# Patient Record
Sex: Male | Born: 1935 | Race: White | Hispanic: No | Marital: Married | Smoking: Never smoker
Health system: Southern US, Community
[De-identification: ages and names within clinical notes are randomized; demographics above are authoritative.]

## PROBLEM LIST (undated history)

## (undated) DIAGNOSIS — E079 Disorder of thyroid, unspecified: Secondary | ICD-10-CM

## (undated) DIAGNOSIS — E7800 Pure hypercholesterolemia, unspecified: Secondary | ICD-10-CM

## (undated) DIAGNOSIS — R339 Retention of urine, unspecified: Secondary | ICD-10-CM

## (undated) HISTORY — PX: BACK SURGERY: SHX140

---

## 2008-06-05 ENCOUNTER — Emergency Department (HOSPITAL_COMMUNITY): Admission: EM | Admit: 2008-06-05 | Discharge: 2008-06-05 | Payer: Self-pay | Admitting: Emergency Medicine

## 2008-07-18 ENCOUNTER — Ambulatory Visit: Payer: Self-pay | Admitting: Family Medicine

## 2008-07-18 DIAGNOSIS — A0472 Enterocolitis due to Clostridium difficile, not specified as recurrent: Secondary | ICD-10-CM | POA: Insufficient documentation

## 2008-07-18 DIAGNOSIS — N138 Other obstructive and reflux uropathy: Secondary | ICD-10-CM | POA: Insufficient documentation

## 2008-07-18 DIAGNOSIS — K219 Gastro-esophageal reflux disease without esophagitis: Secondary | ICD-10-CM | POA: Insufficient documentation

## 2008-07-18 DIAGNOSIS — K279 Peptic ulcer, site unspecified, unspecified as acute or chronic, without hemorrhage or perforation: Secondary | ICD-10-CM | POA: Insufficient documentation

## 2008-07-18 DIAGNOSIS — R5383 Other fatigue: Secondary | ICD-10-CM

## 2008-07-18 DIAGNOSIS — R5381 Other malaise: Secondary | ICD-10-CM

## 2008-07-18 DIAGNOSIS — K5900 Constipation, unspecified: Secondary | ICD-10-CM | POA: Insufficient documentation

## 2008-07-18 DIAGNOSIS — N401 Enlarged prostate with lower urinary tract symptoms: Secondary | ICD-10-CM

## 2008-07-18 LAB — CONVERTED CEMR LAB
Nitrite: NEGATIVE
Protein, U semiquant: 100
Urobilinogen, UA: 0.2

## 2008-07-19 ENCOUNTER — Ambulatory Visit (HOSPITAL_COMMUNITY): Admission: RE | Admit: 2008-07-19 | Discharge: 2008-07-19 | Payer: Self-pay | Admitting: Family Medicine

## 2008-07-19 LAB — CONVERTED CEMR LAB
AST: 25 units/L (ref 0–37)
Albumin: 3.5 g/dL (ref 3.5–5.2)
BUN: 42 mg/dL — ABNORMAL HIGH (ref 6–23)
CO2: 22 meq/L (ref 19–32)
Calcium: 8.7 mg/dL (ref 8.4–10.5)
Chloride: 100 meq/L (ref 96–112)
Glucose, Bld: 107 mg/dL — ABNORMAL HIGH (ref 70–99)
PSA: 8.52 ng/mL — ABNORMAL HIGH (ref 0.10–4.00)
Potassium: 3.5 meq/L (ref 3.5–5.3)

## 2008-07-20 ENCOUNTER — Encounter (INDEPENDENT_AMBULATORY_CARE_PROVIDER_SITE_OTHER): Payer: Self-pay | Admitting: Family Medicine

## 2008-07-20 LAB — CONVERTED CEMR LAB
Amylase: 65 units/L (ref 27–131)
Eosinophils Absolute: 0 10*3/uL (ref 0.0–0.7)
Lipase: 20 units/L (ref 11–59)
Lymphs Abs: 1.2 10*3/uL (ref 0.7–4.0)
MCV: 93.3 fL (ref 78.0–100.0)
Neutrophils Relative %: 89 % — ABNORMAL HIGH (ref 43–77)
Platelets: 382 10*3/uL (ref 150–400)
WBC: 13.7 10*3/uL — ABNORMAL HIGH (ref 4.0–10.5)

## 2008-07-25 ENCOUNTER — Ambulatory Visit: Payer: Self-pay | Admitting: Family Medicine

## 2008-07-25 DIAGNOSIS — E039 Hypothyroidism, unspecified: Secondary | ICD-10-CM | POA: Insufficient documentation

## 2008-07-26 ENCOUNTER — Encounter (INDEPENDENT_AMBULATORY_CARE_PROVIDER_SITE_OTHER): Payer: Self-pay | Admitting: Family Medicine

## 2008-07-27 ENCOUNTER — Telehealth (INDEPENDENT_AMBULATORY_CARE_PROVIDER_SITE_OTHER): Payer: Self-pay | Admitting: Family Medicine

## 2008-08-25 ENCOUNTER — Ambulatory Visit: Payer: Self-pay | Admitting: Family Medicine

## 2008-08-26 ENCOUNTER — Encounter (INDEPENDENT_AMBULATORY_CARE_PROVIDER_SITE_OTHER): Payer: Self-pay | Admitting: Family Medicine

## 2008-09-11 ENCOUNTER — Encounter (INDEPENDENT_AMBULATORY_CARE_PROVIDER_SITE_OTHER): Payer: Self-pay | Admitting: Urology

## 2008-09-12 ENCOUNTER — Inpatient Hospital Stay (HOSPITAL_COMMUNITY): Admission: RE | Admit: 2008-09-12 | Discharge: 2008-09-15 | Payer: Self-pay | Admitting: Urology

## 2008-10-02 ENCOUNTER — Ambulatory Visit: Payer: Self-pay | Admitting: Family Medicine

## 2008-10-02 DIAGNOSIS — K2980 Duodenitis without bleeding: Secondary | ICD-10-CM | POA: Insufficient documentation

## 2008-11-27 ENCOUNTER — Ambulatory Visit: Payer: Self-pay | Admitting: Family Medicine

## 2008-11-27 DIAGNOSIS — M25569 Pain in unspecified knee: Secondary | ICD-10-CM

## 2008-12-01 ENCOUNTER — Telehealth (INDEPENDENT_AMBULATORY_CARE_PROVIDER_SITE_OTHER): Payer: Self-pay | Admitting: *Deleted

## 2008-12-04 ENCOUNTER — Ambulatory Visit: Payer: Self-pay | Admitting: Family Medicine

## 2008-12-04 ENCOUNTER — Ambulatory Visit (HOSPITAL_COMMUNITY): Admission: RE | Admit: 2008-12-04 | Discharge: 2008-12-04 | Payer: Self-pay | Admitting: Family Medicine

## 2008-12-04 DIAGNOSIS — L723 Sebaceous cyst: Secondary | ICD-10-CM

## 2008-12-05 ENCOUNTER — Telehealth (INDEPENDENT_AMBULATORY_CARE_PROVIDER_SITE_OTHER): Payer: Self-pay | Admitting: *Deleted

## 2008-12-05 ENCOUNTER — Encounter (INDEPENDENT_AMBULATORY_CARE_PROVIDER_SITE_OTHER): Payer: Self-pay | Admitting: Family Medicine

## 2008-12-08 ENCOUNTER — Ambulatory Visit (HOSPITAL_COMMUNITY): Admission: RE | Admit: 2008-12-08 | Discharge: 2008-12-08 | Payer: Self-pay | Admitting: Family Medicine

## 2008-12-08 ENCOUNTER — Encounter (INDEPENDENT_AMBULATORY_CARE_PROVIDER_SITE_OTHER): Payer: Self-pay | Admitting: Family Medicine

## 2008-12-11 ENCOUNTER — Telehealth (INDEPENDENT_AMBULATORY_CARE_PROVIDER_SITE_OTHER): Payer: Self-pay | Admitting: *Deleted

## 2008-12-11 ENCOUNTER — Ambulatory Visit: Payer: Self-pay | Admitting: Family Medicine

## 2008-12-11 DIAGNOSIS — M5137 Other intervertebral disc degeneration, lumbosacral region: Secondary | ICD-10-CM

## 2008-12-12 ENCOUNTER — Encounter: Admission: RE | Admit: 2008-12-12 | Discharge: 2008-12-12 | Payer: Self-pay | Admitting: Neurosurgery

## 2008-12-18 ENCOUNTER — Telehealth (INDEPENDENT_AMBULATORY_CARE_PROVIDER_SITE_OTHER): Payer: Self-pay | Admitting: *Deleted

## 2008-12-27 ENCOUNTER — Encounter: Admission: RE | Admit: 2008-12-27 | Discharge: 2008-12-27 | Payer: Self-pay | Admitting: Neurosurgery

## 2009-01-02 ENCOUNTER — Observation Stay (HOSPITAL_COMMUNITY): Admission: AD | Admit: 2009-01-02 | Discharge: 2009-01-03 | Payer: Self-pay | Admitting: Neurosurgery

## 2009-01-09 ENCOUNTER — Inpatient Hospital Stay (HOSPITAL_COMMUNITY): Admission: RE | Admit: 2009-01-09 | Discharge: 2009-01-11 | Payer: Self-pay | Admitting: Neurosurgery

## 2009-02-01 ENCOUNTER — Encounter (INDEPENDENT_AMBULATORY_CARE_PROVIDER_SITE_OTHER): Payer: Self-pay | Admitting: Family Medicine

## 2009-04-16 ENCOUNTER — Ambulatory Visit: Payer: Self-pay | Admitting: Family Medicine

## 2009-04-16 DIAGNOSIS — E538 Deficiency of other specified B group vitamins: Secondary | ICD-10-CM

## 2009-04-16 DIAGNOSIS — E8809 Other disorders of plasma-protein metabolism, not elsewhere classified: Secondary | ICD-10-CM

## 2009-04-17 ENCOUNTER — Encounter (INDEPENDENT_AMBULATORY_CARE_PROVIDER_SITE_OTHER): Payer: Self-pay | Admitting: Family Medicine

## 2009-04-17 LAB — CONVERTED CEMR LAB
ALT: 15 units/L (ref 0–53)
Albumin: 4 g/dL (ref 3.5–5.2)
Bilirubin, Direct: 0.1 mg/dL (ref 0.0–0.3)
TSH: 3.13 microintl units/mL (ref 0.350–4.500)
Total Protein: 6.5 g/dL (ref 6.0–8.3)
Vitamin B-12: 545 pg/mL (ref 211–911)

## 2009-05-03 ENCOUNTER — Encounter (INDEPENDENT_AMBULATORY_CARE_PROVIDER_SITE_OTHER): Payer: Self-pay | Admitting: Family Medicine

## 2009-07-04 ENCOUNTER — Encounter: Admission: RE | Admit: 2009-07-04 | Discharge: 2009-07-17 | Payer: Self-pay | Admitting: Neurology

## 2009-07-05 ENCOUNTER — Encounter (INDEPENDENT_AMBULATORY_CARE_PROVIDER_SITE_OTHER): Payer: Self-pay | Admitting: *Deleted

## 2009-10-11 ENCOUNTER — Encounter: Payer: Self-pay | Admitting: Gastroenterology

## 2009-10-16 HISTORY — PX: COLONOSCOPY: SHX174

## 2009-10-17 ENCOUNTER — Encounter: Payer: Self-pay | Admitting: Gastroenterology

## 2009-10-22 ENCOUNTER — Ambulatory Visit (HOSPITAL_COMMUNITY): Admission: RE | Admit: 2009-10-22 | Discharge: 2009-10-22 | Payer: Self-pay | Admitting: Gastroenterology

## 2009-10-22 ENCOUNTER — Telehealth (INDEPENDENT_AMBULATORY_CARE_PROVIDER_SITE_OTHER): Payer: Self-pay

## 2009-10-22 ENCOUNTER — Ambulatory Visit: Payer: Self-pay | Admitting: Gastroenterology

## 2010-02-08 ENCOUNTER — Encounter (INDEPENDENT_AMBULATORY_CARE_PROVIDER_SITE_OTHER): Payer: Self-pay | Admitting: *Deleted

## 2010-08-03 IMAGING — CT CT PELVIS W/ CM
2 of 4 series · 17 of 46 positions shown, 19 images · IV contrast (CONTRAST)
Comparison: CT pelvis dated 07/19/2008

CLINICAL DATA: Left hip pain with progressive left leg weakness
and left foot swelling.

CT PELVIS WITH CONTRAST
TECHNIQUE: Multidetector CT imaging of the pelvis was performed
using the standard protocol following the bolus administration of
intravenous contrast.
Contrast: 75 ml [id number]

[Series 2: pelvis · axial · 0.68mm/px · z∈[+322,+547]mm · 14 of 53 slices shown, 16 images]
[im 4/53  soft-tissue]
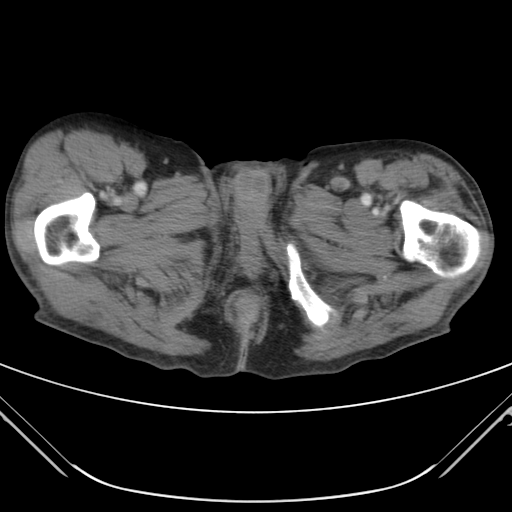
[im 4/53  bone]
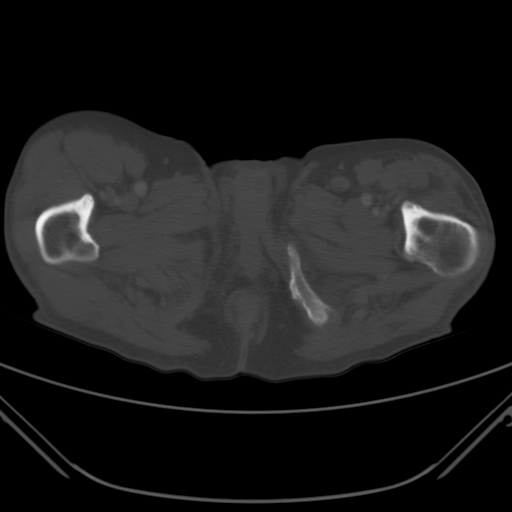
[im 7/53  soft-tissue]
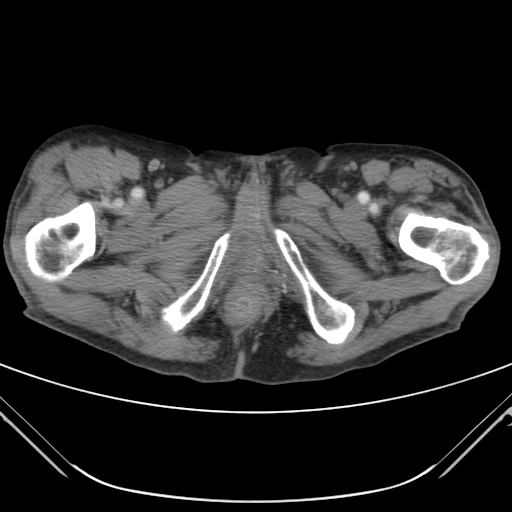
[im 10/53  soft-tissue]
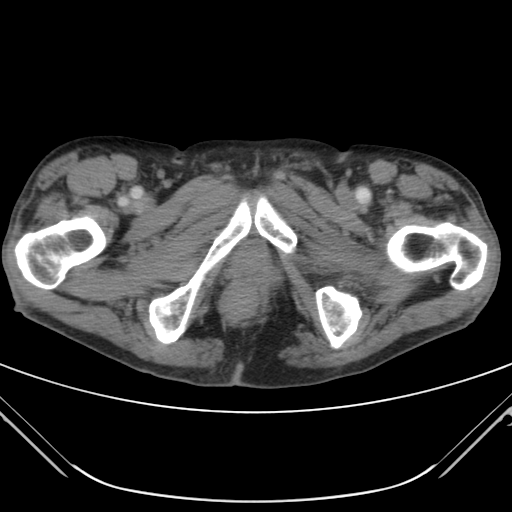
[im 16/53  soft-tissue]
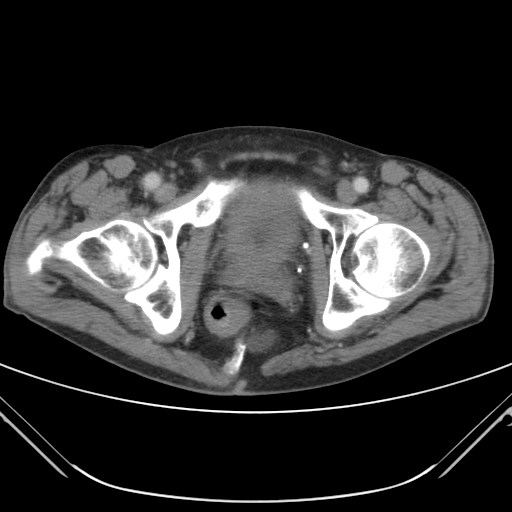
[im 19/53  soft-tissue]
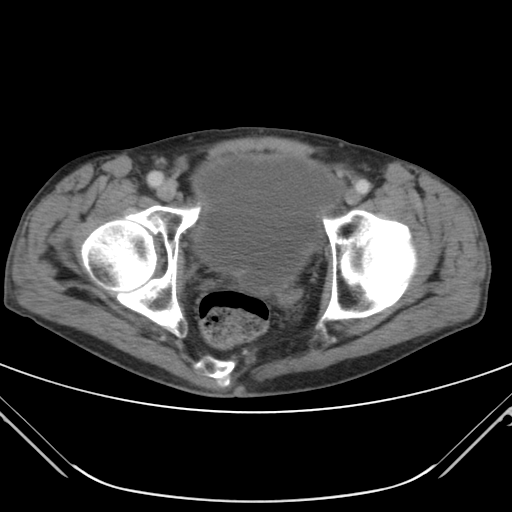
[im 22/53  soft-tissue]
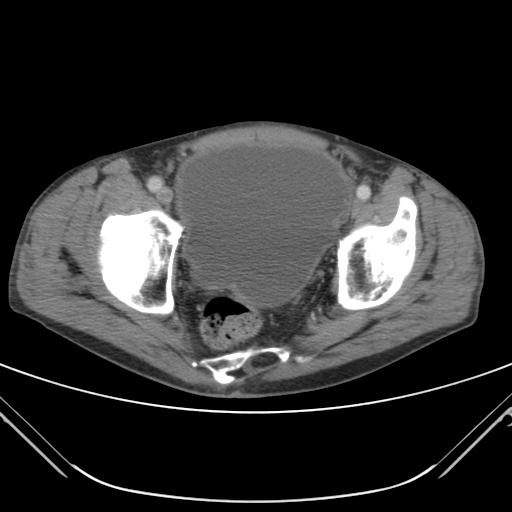
[im 25/53  soft-tissue]
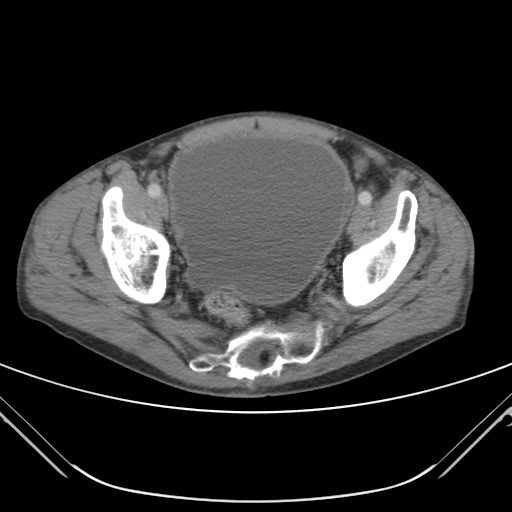
[im 28/53  soft-tissue]
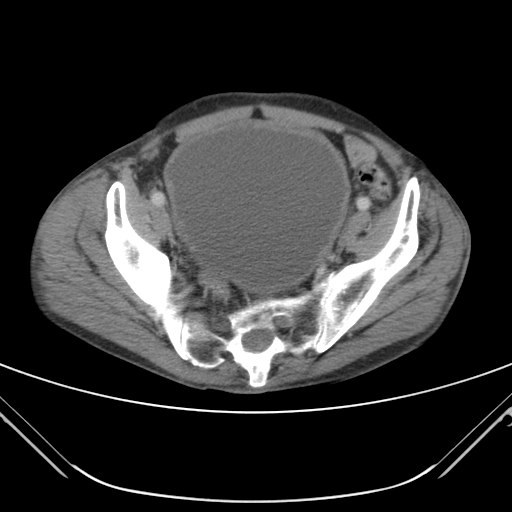
[im 31/53  soft-tissue]
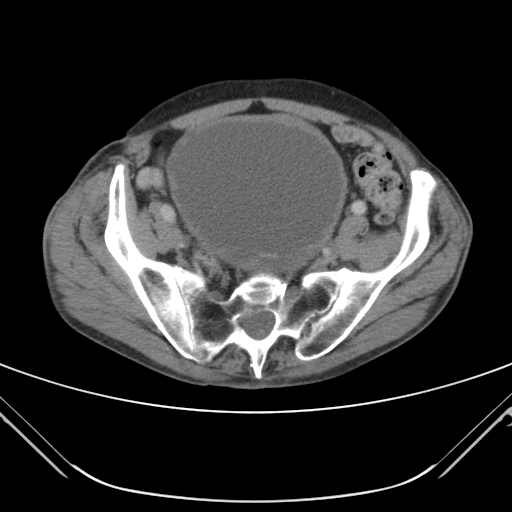
[im 31/53  bone]
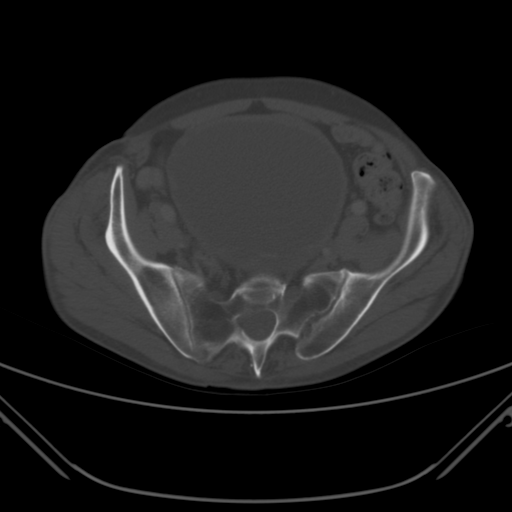
[im 34/53  soft-tissue]
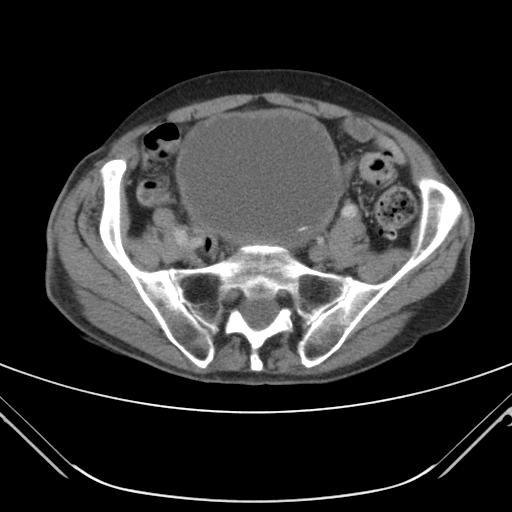
[im 40/53  soft-tissue]
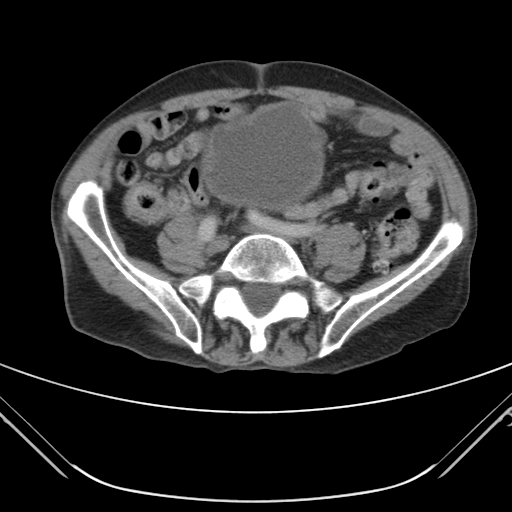
[im 43/53  soft-tissue]
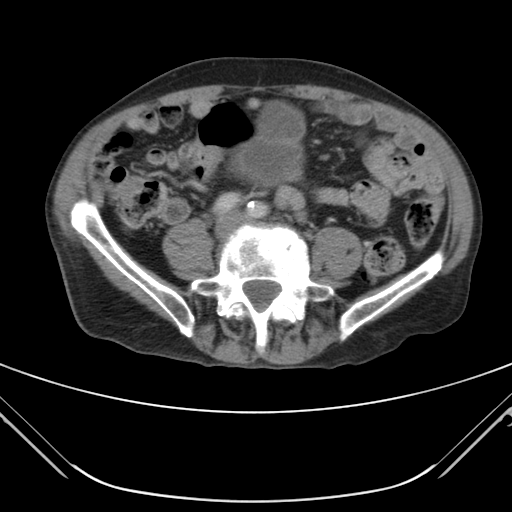
[im 46/53  soft-tissue]
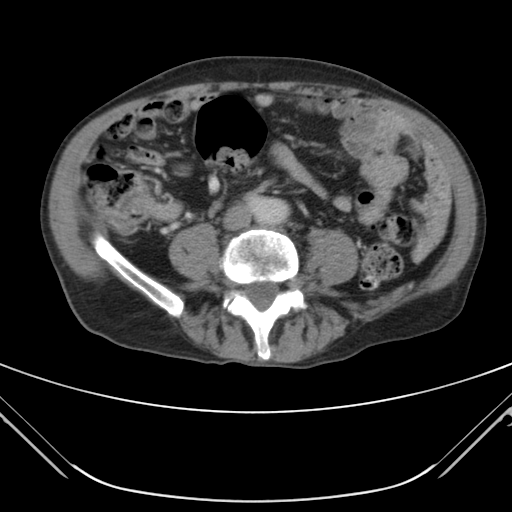
[im 49/53  soft-tissue]
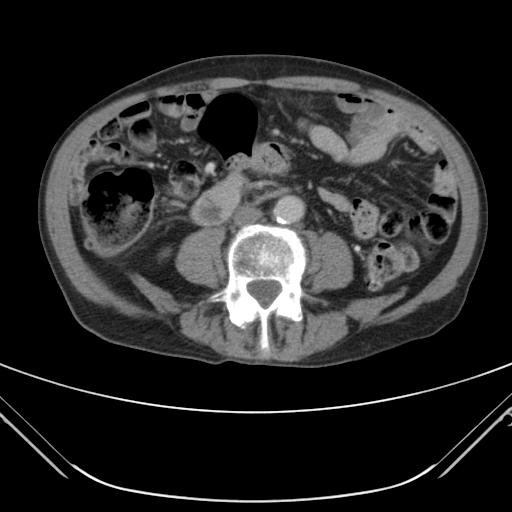

[coronals · coronal · 0.68mm/px · 3 of 107 slices shown]
[im 36/107  soft-tissue]
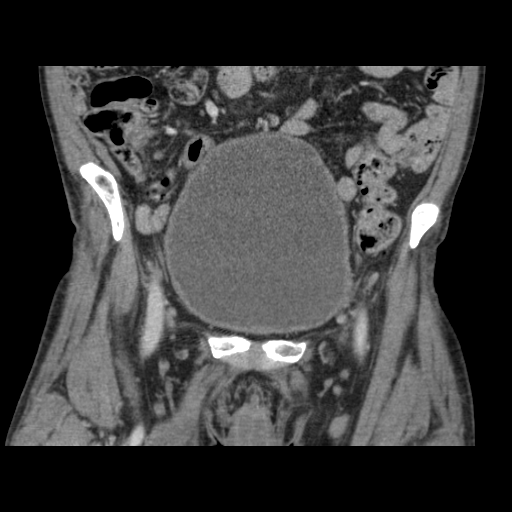
[im 48/107  soft-tissue]
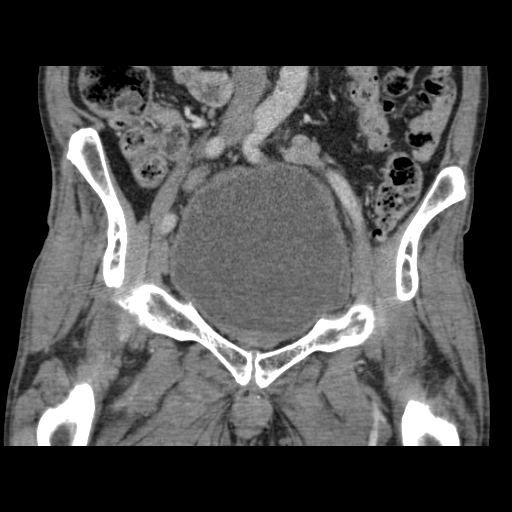
[im 59/107  soft-tissue]
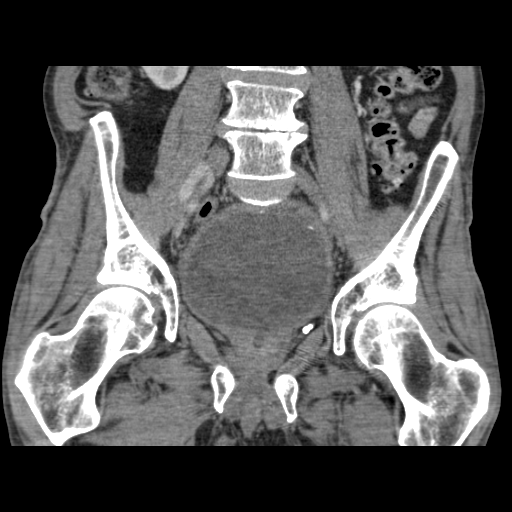

[17 of 46 positions shown; findings below may reference images not displayed]

FINDINGS: Again noted is the tethered cord with a
lipomyelomeningocele of the distal sacrum.  This is unchanged since
the prior CT scan dated 07/19/2008 and the prior lumbar MRI dated
12/08/2008.

The bladder is markedly distended which is probably neurogenic in
origin.  The distended urinary bladder is immediately anterior to
the left S1 nerve root as it exits the left S1 neural foramen but
the nerve is not compressed.  The remainder of the left sacral
plexus and sciatic nerve appear normal.

There is a 2.3 cm soft tissue density just to the left of the
rectum adjacent to the tip of the deformed distal sacrum which is
unchanged and probably represents a small meningocele.

The seminal vesicles were markedly distended on the prior exam but
these appear normal on the current exam.

Note is made of scattered diverticuli in the distal colon.  No
adenopathy.  There is mild degenerative disc and joint disease in
the lower lumbar spine.
IMPRESSION: 1.  No significant change in the appearance of the pelvis since the
prior CT scan dated 07/19/2008 other than the bladder is markedly
distended and the seminal vesicles are no longer distended.
2.  The distended urinary bladder it is immediately adjacent to but
not compressing the left S1 nerve root as it exits the left S1
foramen.
3.  There are several tiny calcifications in the mucosa of the
posterior wall of the bladder, nonspecific.  However,
calcifications can be associated with bladder malignancies.

## 2010-09-17 NOTE — Letter (Signed)
Summary: TRIAGE  TRIAGE   Imported By: Diana Eves 10/11/2009 13:45:00  _____________________________________________________________________  External Attachment:    Type:   Image     Comment:   External Document  Appended Document: TRIAGE TRILYTE  Appended Document: TRIAGE Rx and instructions faxed to [facility]. LMOM for pt to call.  Appended Document: TRIAGE Pt's wife returned call. Reviewed instructions with her. She will call if any questions after she picks up the Rx and paper work.

## 2010-09-17 NOTE — Letter (Signed)
Summary: Internal Other /triage/instructions  Internal Other /triage/instructions   Imported By: Cloria Spring LPN 16/05/9603 54:09:81  _____________________________________________________________________  External Attachment:    Type:   Image     Comment:   External Document

## 2010-09-17 NOTE — Progress Notes (Signed)
Summary: samples given  Phone Note Other Incoming   Summary of Call: left 4 boxed of [employer]- Lactose Intolerance at the front desk per [facility]. Initial call taken by: Hendricks Limes LPN,  October 22, 2009 10:45 AM

## 2010-09-17 NOTE — Letter (Signed)
Summary: Recall Office Visit  [facility]  [address]   [place], [place] [address]   Phone: [phone]  Fax: [phone]      February 08, 2010   Benjamin [name] [address] [place], [place]  [address] [birth year]   Dear Mr. Schoenberg,   According to our records, it is time for you to schedule a follow-up office visit with [place].   At your convenience, please call [phone] to schedule an office visit. If you have any questions, concerns, or feel that this letter is in error, we would appreciate your call.   Sincerely,    Diana Eves  [facility] Ph: [phone]   Fax: [phone]

## 2010-11-26 LAB — COMPREHENSIVE METABOLIC PANEL
ALT: 26 U/L (ref 0–53)
AST: 19 U/L (ref 0–37)
Albumin: 3.3 g/dL — ABNORMAL LOW (ref 3.5–5.2)
Alkaline Phosphatase: 52 U/L (ref 39–117)
GFR calc Af Amer: >60 mL/min (ref >60)
Potassium: 3.8 mEq/L (ref 3.5–5.1)
Sodium: 141 mEq/L (ref 135–145)
Total Protein: 6.1 g/dL (ref 6.0–8.3)

## 2010-11-26 LAB — URINALYSIS, MICROSCOPIC ONLY
Glucose, UA: NEGATIVE mg/dL
Hgb urine dipstick: NEGATIVE
Leukocytes, UA: NEGATIVE
Specific Gravity, Urine: 1.014 (ref 1.005–1.030)
Urobilinogen, UA: 0.2 mg/dL (ref 0.0–1.0)

## 2010-11-26 LAB — CBC
Hemoglobin: 13.1 g/dL (ref 13.0–17.0)
Platelets: 199 10*3/uL (ref 150–400)
RDW: 13.9 % (ref 11.5–15.5)

## 2010-11-26 LAB — GLUCOSE, RANDOM: Glucose, Bld: 106 mg/dL — ABNORMAL HIGH (ref 70–99)

## 2010-12-02 LAB — BASIC METABOLIC PANEL
BUN: 16 mg/dL (ref 6–23)
BUN: 8 mg/dL (ref 6–23)
Chloride: 107 mEq/L (ref 96–112)
Chloride: 107 mEq/L (ref 96–112)
Creatinine, Ser: 0.61 mg/dL (ref 0.4–1.5)
GFR calc non Af Amer: >60 mL/min (ref >60)
Glucose, Bld: 115 mg/dL — ABNORMAL HIGH (ref 70–99)
Glucose, Bld: 115 mg/dL — ABNORMAL HIGH (ref 70–99)
Potassium: 3.8 mEq/L (ref 3.5–5.1)
Potassium: 4 mEq/L (ref 3.5–5.1)
Sodium: 139 mEq/L (ref 135–145)

## 2010-12-02 LAB — CBC
HCT: 38.7 % — ABNORMAL LOW (ref 39.0–52.0)
HCT: 40.5 % (ref 39.0–52.0)
Hemoglobin: 13.1 g/dL (ref 13.0–17.0)
MCV: 89.6 fL (ref 78.0–100.0)
MCV: 90.2 fL (ref 78.0–100.0)
Platelets: 242 10*3/uL (ref 150–400)
Platelets: 314 10*3/uL (ref 150–400)
RDW: 14.2 % (ref 11.5–15.5)
RDW: 14.3 % (ref 11.5–15.5)
WBC: 6.5 10*3/uL (ref 4.0–10.5)

## 2010-12-02 LAB — DIFFERENTIAL
Basophils Absolute: 0 10*3/uL (ref 0.0–0.1)
Basophils Relative: 0 % (ref 0–1)
Eosinophils Absolute: 0.3 10*3/uL (ref 0.0–0.7)
Eosinophils Relative: 4 % (ref 0–5)
Lymphs Abs: 1.5 10*3/uL (ref 0.7–4.0)
Neutrophils Relative %: 62 % (ref 43–77)

## 2010-12-02 LAB — CLOSTRIDIUM DIFFICILE EIA: C difficile Toxins A+B, EIA: 29

## 2010-12-02 LAB — URINE CULTURE: Special Requests: POSITIVE

## 2010-12-31 NOTE — Op Note (Signed)
NAME:  Benjamin [name], Benjamin [name]              ACCOUNT NO.:  [id number]   MEDICAL RECORD NO.:  [id number]           PATIENT TYPE:   LOCATION:                                 FACILITY:   PHYSICIAN:  Hilda Lias, M.D.   DATE OF BIRTH:  [birth year]   DATE OF PROCEDURE:  01/05/2009  DATE OF DISCHARGE:                               OPERATIVE REPORT   PREOPERATIVE DIAGNOSES:  Left L4-L5 extraforaminal herniated disk with  L4 radiculopathy, atrophy of the left leg, neurogenic bladder, and  lipomeningocele.   POSTOPERATIVE DIAGNOSES:  Left L4-L5 extraforaminal herniated disk with  L4 radiculopathy, atrophy of the left leg, neurogenic bladder, and  lipomeningocele.   PROCEDURE:  Left L4-5 extraforaminal diskectomy with decompression of  the L4 nerve root.  Microscope.   SURGEON:  Hilda Lias, MD   ASSISTANT:  Dr. Lovell Sheehan.   CLINICAL HISTORY:  [name] is a gentleman, who was seen by me in my  office with history of neurogenic bladder, lipomeningocele, and pain  going to the left foot.  In my physical examination, I found that he has  atrophy of the left while the MRI showed that he has a lipomeningocele.  We did an MRI of the lumbar plexus, which was also negative.  EMG and  nerve conduction tests showed that he might have lumbar plexopathy.  The  patient had been seen by Dr. Thad Ranger.  With pain in the left leg down  to the left foot, Dr. Thad Ranger advised to proceed with the surgery to  decompress the L4 nerve root.  The patient and the wife knew that this  would not affect whatsoever his neurogenic bladder or the atrophy of the  left leg, which is old.  He is fully aware that this would help with the  shooting pain down to the left foot.   DESCRIPTION OF PROCEDURE:  The patient was taken to the OR and after  intubation, he was positioned in a prone manner.  Midline incision from  L4-L5 was made.  Retraction was done laterally.  Two x-rays showed that  indeed we were at the level of  L4-5, extraforaminal.  We brought the  microscope into the area.  With a drill, we did remove part of the facet  superolaterally.  The intertransverse ligament was also excised.  Indeed, we found that the L4 nerve root was displaced.  Although  distally it was normal, proximally there was a large herniated disk  affecting the takeoff of L4.  Incision was made and several pieces of  fragment of disk were removed.  At the end, we have good decompression  of the L4 nerve root.  Valsalva maneuver was negative.  Fentanyl and  Depo-Medrol were left in the epidural space, and the wound was closed  with Vicryl and Steri-Strips.           ______________________________  Hilda Lias, M.D.     EB/MEDQ  D:  01/09/2009  T:  01/10/2009  Job:  [id number]

## 2010-12-31 NOTE — Consult Note (Signed)
Benjamin [name], Benjamin [name]              ACCOUNT NO.:  [id number]   MEDICAL RECORD NO.:  [id number]          PATIENT TYPE:  INP   LOCATION:  3030                         FACILITY:  [facility]   PHYSICIAN:  Casimiro Needle L. Reynolds, M.D.DATE OF BIRTH:  1933-02-26   DATE OF CONSULTATION:  DATE OF DISCHARGE:                                 CONSULTATION   CHIEF COMPLAINT:  Left leg weakness.   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  Apparently approximately 2 months ago, he was working  on his car in a [place] style like sitting with his left leg crossed over  his right.  He was changing a tire when he noticed a small twinge his  back.  The patient got nothing of it at that time, finished his job, and  went to bed that night.  When he woke up in the morning, he noted slight  discomfort in the posterior aspect of his hamstring on his left leg and  some weakness.  According to his wife, this weakness increased over the  past 1-2 weeks which became a daily nuisance.  The patient went to see  his renal physician, Dr. Roel Cluck approximately 2-3 weeks later in which  he noticed that he had some weakness in his left leg.  At that time, Dr.  Roel Cluck placed him on some Flexeril, ordered an x-ray and MRI of his  lower back.  Unfortunately, the Flexeril caused him to be very dizzy,  however, the MRI of his lower back on December 08, 2008 did show mild disk  bulge with focal far left protrusion and exited left L4 root at the L4-  L5 junction.  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  At that time, the  patient was ordered for a myelogram with CT myelogram of his lower back  which at that time showed advanced degenerative disk disease at L4-L5.  The reading was without  focal compressive lesion.  It also showed the  lipomeningocele with spinal cord tethering abnormality low at the level  of S4.  The lipoma measured roughly 14 x 15 x 20 mm.  CT myelogram of  the thoracic spine showed no abnormalities.  The patient at that time  was referred to neurosurgeon, Dr. Jeral Fruit who has recently admitted the  patient secondary to continued atrophy, weakness, and decreased ability  to extend his left leg.  Dr. Jeral Fruit ordered a CT of pelvis which showed  no significant change in appearance of the pelvis since the prior CT on  July 19, 2008.  This showed distention of the urinary bladder, but no  compressing of the S1 nerve root as it exits the left S1 foramen.   At the present time of consultation, the patient is lying supine in bed  without any discomfort.  He is alert.  He is oriented.  He is describing  allodynia and hypoesthesia of his left medial aspect of his calf along  with some hypoesthesia of the left lateral aspect of his knee.  Describes the inability to extend his leg but no positive dorsiflexion  or plantar flexion.  He denies any bowel or bladder problems.  He denies  any weakness with hip flexion.  It should be noted that he also admits  to having some significant weakness with his left leg adduction.   PAST SURGICAL HISTORY:  Appendectomy in 1940 and TURP in January 2010.   PAST MEDICAL HISTORY:  Negative with the exception of above.   MEDICATIONS:  The patient only takes ibuprofen at home.  He has tried  Flexeril for pain, however, he had significant side effects.  He is now  being placed on OxyContin by Dr. Jeral Fruit for pain.  We will recommend  placing him on Neurontin 300 mg t.i.d.   ALLERGIES:  No known drug allergies.   SOCIAL HISTORY:  Does not smoke, drink, or do illicit drugs.   REVIEW OF SYSTEMS:  All negative except for above.   PHYSICAL EXAMINATION:  VITAL SIGNS:  Temperature is 98, pulse 84,  respirations 20, and blood pressure  is 140/80.  GENERAL:  The patient is alert.  He is oriented and has good demeanor.  He answers questions appropriately.  He is dressed well.  PULMONARY:  Clear to auscultation bilaterally.  CARDIOVASCULAR:  S1-S2.  Regular rate and rhythm.  NECK:  Negative for bruits and supple.  ABDOMEN:  Soft, nontender, and nondistended.  Bowel sounds are in all  four quadrants.  NEUROLOGIC:  Eyes:  Pupils are equal, round, and react to accommodation  and light.  Conjugate gaze.  Extraocular muscles are intact.  Visual  fields are grossly intact.  Face is symmetrical.  Tongue is midline.  Uvula is midline.  The patient does not have any dysarthria, aphagia, or  slurred speech.  Sensation is full from V1-V3 bilaterally to pinprick,  light touch.  Shoulder shrug and head turn is full with good range of  motion.  Coordination of finger-to-nose bilaterally is smooth.  Heel-to-  shin, he is only able to participate with his right over his left,  however, there is no dysmetria or ataxia.  Fine motor movements in the  upper extremities are within normal limits.  Gait:  The patient uses a  cane and has significant difficulty with the extension of his left knee.  He is able to flex and was unable to bear weight on his left leg  secondary to clot weakness.  Motor:  At this time, all strengths are  within normal limits about 5/5 with the exception of his left knee  extension which is 1/5, left knee flexion which is 4/5, left leg  adduction which is 2/5, left dorsiflexion is 5/5, left plantar flexion  is 4/5, left abduction is 5/5, left hip flexion is 4/5, and left leg  extension at the hip is 5/5.  There is notable left quad atrophy.  Sensation:  The patient has allodynia and hyperesthesia over his left  medial calf and has noted some left hypoesthesia over the lateral  portion of his knee.  The area that encompasses the allodynia and  hyperesthesia is from his left medial knee to his ankle.   LABORATORY DATA:   All pending at this time.   IMAGING TESTS:  As above.   ASSESSMENT:  At this time, this is a 77 year old male with left leg  weakness which  appears to be due to a high lumbar plexus lesion or  lesion likely multilevel with mid lumbar root involvement.  Predominately, most of the signs and symptoms are noted over the L4  nerve root region suggesting superimposed L4 radiculopathy which is  supported by imaging but does not explain his hip flexor weakness.  Another consideration is whether some of his problems are chronic due to  the underlying spinal dysmorphism which is decompensated in the face of  an acute process.  At this time, we would recommend gabapentin 300 mg  t.i.d. p.o. for neuropathic pain, physical therapy for evaluation and  treat of gait, pain control, MRI of his lumbar plexus, and basic labs  such as TSH, B12, and HbA1c.  We will continue to follow this patient.    ______________________________  Felicie Morn, PA-C      Marolyn Hammock. Thad Ranger, M.D.  Electronically Signed   DS/MEDQ  D:  01/02/2009  T:  01/03/2009  Job:  [id number]   cc:   Casimiro Needle L. Thad Ranger, M.D.

## 2010-12-31 NOTE — H&P (Signed)
NAME:  ZYAN, COBY NO.:  [id number]   MEDICAL RECORD NO.:  [id number]          PATIENT TYPE:  INP   LOCATION:  3001                         FACILITY:  [facility]   PHYSICIAN:  Hilda Lias, M.D.   DATE OF BIRTH:  04/25/1933   DATE OF ADMISSION:  01/09/2009  DATE OF DISCHARGE:                              HISTORY & PHYSICAL   Benjamin [name] is a gentleman who was seen in my office complaining of  weakness and burning sensation in the left leg.  The patient has a  complete workup, which showed atrophy of the left quadriceps with upper  lumbar plexus compromise.  The lumbar MRI was repeated twice and showed  that he has a herniated disk at L4-5 bilaterally compromising the L4  nerve root.  He had been seen by Dr. Thad Ranger last week when he was  admitted and he was told that he might have two problems, one the pain  going to the left foot, and the other one the lumbar plexus  inflammation.  The patient is being admitted today for L4-5 diskectomy  knowing that this will help with the pain going to the left foot, but  would not change his atrophy of the left side.   PAST MEDICAL HISTORY:  Bladder surgery.   ALLERGIES:  He is not allergic to medication.   SOCIAL HISTORY:  Negative.   FAMILY HISTORY:  Negative.   REVIEW OF SYSTEMS:  Positive for pain in the left leg and weakness.   PHYSICAL EXAMINATION:  GENERAL:  The patient came to my office.  He was  limping from the left leg.  HEENT:  Head, ears, nose, and throat normal.  NECK:  Normal.  LUNGS:  Clear.  CARDIAC:  Heart sounds Normal.  ABDOMEN:  Normal.  EXTREMITIES:  Normal pulse.  NEUROLOGIC:  He has atrophy of the left quadriceps and the left calf  muscle.  He has weakness of the iliopsoas as well as the quadriceps.  The MRI showed lateral meningocele and protrusion L4-5 compromising the  L4 nerve root.   CLINICAL IMPRESSION:  Lumbar plexus inflammation - disease, L4-5  bilateral herniated disk  compromising the L4 nerve root.   RECOMMENDATIONS:  The patient is being admitted for surgery.  The  surgery would be L4-5 diskectomy.  The patient knows the risk of the  surgery including the fact that it would help with the pain of the left  foot, but would not change the atrophy of the left leg.  He is going to  be seen later on by Dr. Thad Ranger from the Neurology Service.           ______________________________  Hilda Lias, M.D.     EB/MEDQ  D:  01/09/2009  T:  01/10/2009  Job:  [id number]

## 2010-12-31 NOTE — Op Note (Signed)
NAME:  Benjamin [name], Benjamin [name]              ACCOUNT NO.:  [id number]   MEDICAL RECORD NO.:  [id number]          PATIENT TYPE:  OIB   LOCATION:  A317                          FACILITY:  [facility]   PHYSICIAN:  Ky Barban, M.D.DATE OF BIRTH:  11-11-1932   DATE OF PROCEDURE:  DATE OF DISCHARGE:                               OPERATIVE REPORT   PREOPERATIVE DIAGNOSES:  1. Urinary retention.  2. Benign prostatic hypertrophy.   POSTOPERATIVE DIAGNOSES:  1. Urinary retention.  2. Benign prostatic hypertrophy.   PROCEDURE:  TUR prostate.   ANESTHESIA:  Spinal.   DESCRIPTION OF PROCEDURE:  The patient under spinal anesthesia in  lithotomy position on usual prep and drape, a #28 Iglesias resectoscope  was introduced into the bladder.  Bladder was inspected.  Resectoscope  was pulled back in mid prostatic urethra.  Bladder neck was  circumferentially dissected down to the circular fibers.  The bleeders  were coagulated.  Resectoscope was pulled back at the level of the  verumontanum, rotated to 11 o'clock position.  Right lobe was resected  between 11 and 7 o'clock position.  Similarly, the left lobe was  resected between 1 and 5 o'clock position.  Next, I resected the  posterior midline tissue carefully not to injure the sphincter of the  verumontanum.  There was small amount of tissue in the anterior midline,  which was resected at the end.  Prostatic urethra now looks wide open.  Chips were evacuated.  Bleeders were coagulated.  Resectoscope was  removed and a 22 three-way Foley catheter left in for drainage.  CBI was  started, which was clear.  The patient left the operating room in  satisfactory condition.      Ky Barban, M.D.  Electronically Signed     MIJ/MEDQ  D:  09/11/2008  T:  09/12/2008  Job:  [id number]

## 2010-12-31 NOTE — Consult Note (Signed)
NAME:  Benjamin [name], Benjamin [name] NO.:  [id number]   MEDICAL RECORD NO.:  [id number]          PATIENT TYPE:  AMB   LOCATION:  DAY                           FACILITY:  [facility]   PHYSICIAN:  Ky Barban, M.D.DATE OF BIRTH:  [birth year]   DATE OF CONSULTATION:  DATE OF DISCHARGE:                                 CONSULTATION   HISTORY OF PRESENT ILLNESS:  [name] is coming in to have a surgery  this coming Monday, and he will have a TUR prostate and he will be  admitted in the hospital and [facility].   CHIEF COMPLAINT:  Recurrent urinary tract infection, symptoms of  prostatism.   HISTORY:  A 77 year old gentleman who is in acute urinary retention.  Has slow and weak stream, nocturia x3 and when he was referred over here  to me by Dr. Erby Pian, and at that time they have checked him in his  office.  He had about 1800 mL residual urine.  The patient has given a  voiding trial.  He continues to have large residual urine and CT scan of  the abdominal and pelvis was done.  It shows he has normal upper tracts,  bladder is markedly trabeculated and deformed, prostate is enlarged and  the seminal vesicles were also deformed and dilated and there was  suspicion he might have prostate cancer.  PSA was slightly increased to  8.52, and so I did a prostate biopsy.  It is negative for any prostate  cancer.  Cystoscopy shows it is markedly trabeculated deformed bladder,  prostate is moderately enlarged with bladder neck obstruction.  He might  have neurogenic bladder, but it is difficult to say from his  cystometric.  I advised him that he needs to undergo TUR prostate, and  the patient says that he is voiding fine.  He has no voiding problems,  but his radiological finding and cystoscopic finding show that he does  have a large residual urine and trabeculated bladder.  So, I told him he  needs to have a TUR prostate, and if he can void satisfactorily with  emptying his bladder,  then he will be fine.  Otherwise, he may have to  live with intermittent catheterization.  I have discussed several times  with the patient and his wife the situation and they understand and want  me to go ahead and proceed.   PAST MEDICAL HISTORY:  Otherwise, essentially negative.   PERSONAL HISTORY:  Does not smoke or drink.   REVIEW OF SYSTEMS:  Unremarkable.   PHYSICAL EXAMINATION:  VITAL SIGNS:  Blood pressure 148/87, temperature  is normal.  CENTRAL NERVOUS SYSTEM:  Negative.  HEENT:  Negative.  CHEST:  Symmetrical.  HEART:  Regular sinus rhythm, no murmur.  ABDOMEN:  Soft, flat.  Liver, spleen, kidneys not palpable.  No CVA  tenderness.  EXTERNAL GENITALIA:  Uncircumcised.  Meatus adequate.  Testicles are  normal.  RECTAL:  Sphincter tone is normal.  No rectal mass.  Prostate 1-1/2  plus, smooth and firm.   IMPRESSION:  BPH with bladder neck obstruction.   PLAN:  TUR prostate then admit him to the hospital.      Ky Barban, M.D.  Electronically Signed     MIJ/MEDQ  D:  09/07/2008  T:  09/07/2008  Job:  [id number]   cc:   Franchot Heidelberg, M.D.

## 2010-12-31 NOTE — Consult Note (Signed)
Benjamin [name], Benjamin [name]              ACCOUNT NO.:  [id number]   MEDICAL RECORD NO.:  [id number]          PATIENT TYPE:  INP   LOCATION:  A317                          FACILITY:  [facility]   PHYSICIAN:  Osvaldo Shipper, MD     DATE OF BIRTH:  1932/10/08   DATE OF CONSULTATION:  09/15/2008  DATE OF DISCHARGE:  09/15/2008                                 CONSULTATION   REFERRING PHYSICIAN:  Dr. Jerre Simon.   PRIMARY MEDICAL DOCTOR:  Dr.  Erby Pian.   REASON FOR CONSULTATION:  C-diff colitis.   CHIEF COMPLAINT:  Diarrhea.   HISTORY OF PRESENT ILLNESS:  The patient is a 77 year old Caucasian male  who underwent transurethral resection of prostate on September 11, 2008.  He was admitted by Dr. Jerre Simon, and he was on his service.  About 2 days  ago, the patient started having diarrhea along with fever.  His last  temperature was 101.3 the day before yesterday.  Stool studies were sent  off and they came back showing C-diff today.  The patient was thought to  be ready for discharge, however, because C-diff was positive, Dr. Jerre Simon  wanted a consultation with [place] to clarify this issue.   The patient is feeling very well.  He had one episode of diarrhea at  6:00 a.m. this morning and none since then.  He had two episodes of  diarrhea yesterday and between those two yesterday and one this morning  there was a gap of 18 hours.  He denies any nausea or vomiting, denies  any more fever or chills, denies any abdominal pain.  He denies being  around anybody who has known C-diff.  He has been taking antibiotics as  an outpatient for various issues in the last few months.   MEDICATIONS AT HOME:  Otherwise, he does not take any other medications.   ALLERGIES:  HE IS ON NOT REALLY ALLERGIC TO ANYTHING.   PAST MEDICAL HISTORY:  Fairly unremarkable.  This gentleman is fairly  healthy.  The only other surgeries he has had is an appendectomy in the  1940s.   SOCIAL HISTORY:  He lives with his elderly wife and he is  fairly  independent and does not have any other vices.   FAMILY HISTORY:  Noncontributory.   REVIEW OF SYSTEMS:  Unremarkable, except as mentioned in HPI.   PHYSICAL EXAMINATION:  VITAL SIGNS:  Temperature 97.7, heart rate 84,  respiratory rate 20, blood pressure 117/63.  Saturation 97% on room air.  He has been afebrile over the past 24 hours.  GENERAL:  An elderly thin white male in no distress, very pleasant to  talk to.  HEENT:  There is no pallor, no icterus.  Oral mucous membranes are  moist.  No oral lesions are noted.  NECK:  Soft and supple.  No  thyromegaly is appreciated.  LUNGS:  Clear to auscultation bilaterally.  CARDIOVASCULAR:  S1-S2 is normal, regular.  No murmurs appreciated.  No  S3-S4.  No rubs, no bruits.  ABDOMEN:  Soft, completely benign, nontender, nondistended.  Bowel  sounds are present.  No masses or  organomegaly are appreciated.  EXTREMITIES:  Show no edema.  MUSCULOSKELETAL:  Unremarkable.  NEUROLOGICAL:  No focal deficits noted.   LABORATORY DATA:  No labs are available today.  The only thing that is  positive from yesterday is a C-diff x1.  No CBC is available from today.   No recent imaging studies have been done.   ASSESSMENT AND PLAN:  This is a 76 year old Caucasian male who is status  post transurethral resection of prostate procedure a few days ago and  was being discharged by Dr. Jerre Simon today when he noticed that a  Clostridium difficile was positive.  The patient did have some diarrhea  and was febrile over the last couple of days, but the diarrhea as  subsided and so has the fever.  The patient is very keen on going home.  He is not dizzy or lightheaded.  Vital signs are all stable, and this is  his first episode of Clostridium difficile.  He is not aware of anybody  that he has been exposed to.   So this patient possibly has mild Clostridium difficile colitis.  Since  he is otherwise stable, this can be treated as an outpatient.   Since  this is first episode, he will be treated with Flagyl.  He will be given  one dose of 500 mg now before he leaves the hospital and we have already  made arrangements for him to pick up his prescription and [facility].  The  prescription for Flagyl has been faxed over to them.  He has been told  very clearly that if his diarrhea recurs, if he starts getting fever and  chills, experiences abdominal pain, nausea, vomiting, any of these  symptoms or if he feels unwell in anyway, he needs to seek attention  immediately.   I have also told him that he needs to see his primary medical doctor,  Dr. Erby Pian next week.  I tried to call Dr. Claire Shown office this  evening; however, I was unable to get through to any [place].  I will  again reattempt calling him early next week.  The patient tells me that  he has an appointment with Dr. Erby Pian on September 22, 2008, which is  quite appropriate at this time.   So from a medical standpoint, he is stable for discharge.  This will be  communicated to Dr. Jerre Simon, and the patient can go home.      Osvaldo Shipper, MD  Electronically Signed     GK/MEDQ  D:  09/15/2008  T:  09/15/2008  Job:  [id number]   cc:   Ky Barban, M.D.  Fax: [phone]   Franchot Heidelberg, M.D.

## 2011-01-03 NOTE — Discharge Summary (Signed)
NAME:  LEALON, VANPUTTEN              ACCOUNT NO.:  [id number]   MEDICAL RECORD NO.:  [id number]         PATIENT TYPE:  PINP   LOCATION:  A317                          FACILITY:  [facility]   PHYSICIAN:  Ky Barban, M.D.DATE OF BIRTH:  1933-04-11   DATE OF ADMISSION:  DATE OF DISCHARGE:  01/29/2010LH                               DISCHARGE SUMMARY   A 77 year old gentleman has symptoms of prostatism, large residual  urine, severely trabeculated bladder.  He has about 1800 mL of residual  urine during the workup in the office, so I have send him home with a  Foley catheter.  I have advised him to undergo TUR of the prostate and  he has significantly large prostate, where his symptoms were not  significant.  He continued to have urinary tract infections.  He was  brought as routine admission, where preadmission workup was normal.  On  January 25, I did a TUR of the prostate.  On postop day #1, he was  afebrile.  General status was good.  CBI was clear, so I discontinued  his IV and CBI, put him on regular diet.  On second postop day, his  urine was clear, had bowel movement.  I have discharged him.  But later  on that day, he spiked temperature to 101 and I reinserted the Foley  catheter, but he was also found to have diarrhea and stools were  positive for his C. difficile.  The patient is evaluated by the medical  service hospitalist and urine culture did not grew anything, but he is  on Cipro b.i.d. 250 mg.  Also, he was given Imodium for his diarrhea.  As far as his positive stool with C. difficile, it was treated with  Flagyl, put him on the Flagyl 500 mg p.o. and after that, he was  discharged home with Foley catheter without any antibiotics.  He was  advised to continue taking his levothyroxine 50 mcg daily.  I will see  him back in the office in 1 week.      Ky Barban, M.D.  Electronically Signed     MIJ/MEDQ  D:  10/06/2008  T:  10/07/2008  Job:  [id number]   cc:    Franchot Heidelberg, M.D.

## 2011-05-20 LAB — CBC
HCT: 42.7
Hemoglobin: 14.8
RBC: 4.39
RDW: 13.1
WBC: 6.9

## 2011-05-20 LAB — DIFFERENTIAL
Basophils Absolute: 0
Eosinophils Relative: 2
Lymphocytes Relative: 16
Lymphs Abs: 1.1
Monocytes Absolute: 0.8
Monocytes Relative: 12
Neutro Abs: 4.8

## 2011-05-20 LAB — WOUND CULTURE

## 2014-10-31 ENCOUNTER — Emergency Department (HOSPITAL_COMMUNITY): Payer: Medicare Other

## 2014-10-31 ENCOUNTER — Ambulatory Visit: Admit: 2014-10-31 | Payer: Self-pay | Admitting: Urology

## 2014-10-31 ENCOUNTER — Emergency Department (HOSPITAL_COMMUNITY)
Admission: EM | Admit: 2014-10-31 | Discharge: 2014-10-31 | Disposition: A | Discharge status: Home / Self Care | Payer: Medicare Other | Source: Home / Self Care | Attending: Emergency Medicine | Admitting: Emergency Medicine

## 2014-10-31 ENCOUNTER — Encounter (HOSPITAL_COMMUNITY): Payer: Self-pay

## 2014-10-31 ENCOUNTER — Emergency Department (HOSPITAL_COMMUNITY): Payer: Medicare Other | Admitting: Anesthesiology

## 2014-10-31 ENCOUNTER — Encounter (HOSPITAL_COMMUNITY): Payer: Self-pay | Admitting: Emergency Medicine

## 2014-10-31 ENCOUNTER — Inpatient Hospital Stay (HOSPITAL_COMMUNITY)
Admission: EM | Admit: 2014-10-31 | Discharge: 2014-11-03 | DRG: 663 | Disposition: A | Discharge status: Home / Self Care | Payer: Medicare Other | Attending: Urology | Admitting: Urology

## 2014-10-31 ENCOUNTER — Encounter (HOSPITAL_COMMUNITY)
Admission: EM | Disposition: A | Discharge status: Home / Self Care | Payer: Self-pay | Source: Home / Self Care | Attending: Urology

## 2014-10-31 DIAGNOSIS — E780 Pure hypercholesterolemia: Secondary | ICD-10-CM | POA: Diagnosis present

## 2014-10-31 DIAGNOSIS — Z9079 Acquired absence of other genital organ(s): Secondary | ICD-10-CM | POA: Diagnosis present

## 2014-10-31 DIAGNOSIS — Z681 Body mass index (BMI) 19 or less, adult: Secondary | ICD-10-CM

## 2014-10-31 DIAGNOSIS — Z87891 Personal history of nicotine dependence: Secondary | ICD-10-CM

## 2014-10-31 DIAGNOSIS — N400 Enlarged prostate without lower urinary tract symptoms: Secondary | ICD-10-CM | POA: Diagnosis present

## 2014-10-31 DIAGNOSIS — N3091 Cystitis, unspecified with hematuria: Secondary | ICD-10-CM | POA: Diagnosis present

## 2014-10-31 DIAGNOSIS — E46 Unspecified protein-calorie malnutrition: Secondary | ICD-10-CM | POA: Diagnosis present

## 2014-10-31 DIAGNOSIS — N1330 Unspecified hydronephrosis: Secondary | ICD-10-CM | POA: Diagnosis present

## 2014-10-31 DIAGNOSIS — R310 Gross hematuria: Secondary | ICD-10-CM | POA: Diagnosis present

## 2014-10-31 DIAGNOSIS — N179 Acute kidney failure, unspecified: Secondary | ICD-10-CM | POA: Diagnosis present

## 2014-10-31 DIAGNOSIS — R319 Hematuria, unspecified: Secondary | ICD-10-CM

## 2014-10-31 DIAGNOSIS — D62 Acute posthemorrhagic anemia: Secondary | ICD-10-CM | POA: Diagnosis present

## 2014-10-31 DIAGNOSIS — R339 Retention of urine, unspecified: Secondary | ICD-10-CM

## 2014-10-31 DIAGNOSIS — N3289 Other specified disorders of bladder: Secondary | ICD-10-CM | POA: Diagnosis present

## 2014-10-31 DIAGNOSIS — N390 Urinary tract infection, site not specified: Secondary | ICD-10-CM

## 2014-10-31 HISTORY — DX: Pure hypercholesterolemia, unspecified: E78.00

## 2014-10-31 HISTORY — DX: Disorder of thyroid, unspecified: E07.9

## 2014-10-31 HISTORY — PX: TRANSURETHRAL RESECTION OF BLADDER TUMOR: SHX2575

## 2014-10-31 LAB — CBC WITH DIFFERENTIAL/PLATELET
Basophils Absolute: 0 10*3/uL (ref 0.0–0.1)
Basophils Relative: 0 % (ref 0–1)
Eosinophils Absolute: 0 10*3/uL (ref 0.0–0.7)
Eosinophils Relative: 0 % (ref 0–5)
HCT: 37.6 % — ABNORMAL LOW (ref 39.0–52.0)
Hemoglobin: 12.7 g/dL — ABNORMAL LOW (ref 13.0–17.0)
LYMPHS ABS: 1.1 10*3/uL (ref 0.7–4.0)
LYMPHS PCT: 7 % — AB (ref 12–46)
MCH: 28.9 pg (ref 26.0–34.0)
MCHC: 33.8 g/dL (ref 30.0–36.0)
MCV: 85.5 fL (ref 78.0–100.0)
MONOS PCT: 16 % — AB (ref 3–12)
Monocytes Absolute: 2.7 10*3/uL — ABNORMAL HIGH (ref 0.1–1.0)
NEUTROS ABS: 12.7 10*3/uL — AB (ref 1.7–7.7)
NEUTROS PCT: 77 % (ref 43–77)
Platelets: 445 10*3/uL — ABNORMAL HIGH (ref 150–400)
RBC: 4.4 MIL/uL (ref 4.22–5.81)
RDW: 14.3 % (ref 11.5–15.5)
WBC: 16.5 10*3/uL — AB (ref 4.0–10.5)

## 2014-10-31 LAB — URINALYSIS, ROUTINE W REFLEX MICROSCOPIC
GLUCOSE, UA: 100 mg/dL — AB
KETONES UR: 15 mg/dL — AB
Nitrite: POSITIVE — AB
Specific Gravity, Urine: 1.015 (ref 1.005–1.030)
Urobilinogen, UA: 4 mg/dL — ABNORMAL HIGH (ref 0.0–1.0)
pH: 8.5 — ABNORMAL HIGH (ref 5.0–8.0)

## 2014-10-31 LAB — BASIC METABOLIC PANEL
Anion gap: 9 (ref 5–15)
BUN: 58 mg/dL — AB (ref 6–23)
CHLORIDE: 99 mmol/L (ref 96–112)
CO2: 21 mmol/L (ref 19–32)
Calcium: 8.3 mg/dL — ABNORMAL LOW (ref 8.4–10.5)
Creatinine, Ser: 1.39 mg/dL — ABNORMAL HIGH (ref 0.50–1.35)
GFR calc non Af Amer: 47 mL/min — ABNORMAL LOW (ref >90)
GFR, EST AFRICAN AMERICAN: 54 mL/min — AB (ref >90)
GLUCOSE: 151 mg/dL — AB (ref 70–99)
POTASSIUM: 4.7 mmol/L (ref 3.5–5.1)
SODIUM: 129 mmol/L — AB (ref 135–145)

## 2014-10-31 LAB — HEMOGLOBIN AND HEMATOCRIT, BLOOD
HEMATOCRIT: 31.4 % — AB (ref 39.0–52.0)
Hemoglobin: 10.4 g/dL — ABNORMAL LOW (ref 13.0–17.0)

## 2014-10-31 LAB — URINE MICROSCOPIC-ADD ON

## 2014-10-31 LAB — ABO/RH: ABO/RH(D): O POS

## 2014-10-31 SURGERY — TURBT (TRANSURETHRAL RESECTION OF BLADDER TUMOR)
Anesthesia: General | Site: Ureter | Laterality: Bilateral

## 2014-10-31 MED ORDER — TRAMADOL HCL 50 MG PO TABS
50.0000 mg | ORAL_TABLET | Freq: Once | ORAL | Status: DC
  Filled 2014-10-31: qty 1

## 2014-10-31 MED ORDER — CIPROFLOXACIN HCL 500 MG PO TABS: 500.0000 mg | ORAL_TABLET | Freq: Two times a day (BID) | ORAL | Status: DC

## 2014-10-31 MED ORDER — OXYCODONE HCL 5 MG/5ML PO SOLN
5.0000 mg | Freq: Once | ORAL | Status: DC | PRN
  Filled 2014-10-31: qty 5

## 2014-10-31 MED ORDER — SODIUM CHLORIDE 0.9 % IR SOLN
Status: DC | PRN
  Administered 2014-10-31: 15000 mL

## 2014-10-31 MED ORDER — ONDANSETRON HCL 4 MG/2ML IJ SOLN
INTRAMUSCULAR | Status: DC | PRN
  Administered 2014-10-31: 4 mg via INTRAVENOUS

## 2014-10-31 MED ORDER — PHENYLEPHRINE HCL 10 MG/ML IJ SOLN
INTRAMUSCULAR | Status: DC | PRN
  Administered 2014-10-31 (×2): 80 ug via INTRAVENOUS

## 2014-10-31 MED ORDER — DEXAMETHASONE SODIUM PHOSPHATE 10 MG/ML IJ SOLN
INTRAMUSCULAR | Status: DC | PRN
  Administered 2014-10-31: 10 mg via INTRAVENOUS

## 2014-10-31 MED ORDER — CEFTRIAXONE SODIUM IN DEXTROSE 20 MG/ML IV SOLN
1.0000 g | INTRAVENOUS | Status: DC
  Administered 2014-11-01 – 2014-11-02 (×2): 1 g via INTRAVENOUS
  Filled 2014-10-31 (×2): qty 50

## 2014-10-31 MED ORDER — FENTANYL CITRATE 0.05 MG/ML IJ SOLN
INTRAMUSCULAR | Status: AC
  Filled 2014-10-31: qty 2

## 2014-10-31 MED ORDER — SUCCINYLCHOLINE CHLORIDE 20 MG/ML IJ SOLN
INTRAMUSCULAR | Status: DC | PRN
  Administered 2014-10-31: 100 mg via INTRAVENOUS

## 2014-10-31 MED ORDER — IOHEXOL 300 MG/ML  SOLN
INTRAMUSCULAR | Status: DC | PRN
  Administered 2014-10-31: 20 mL via URETHRAL

## 2014-10-31 MED ORDER — 0.9 % SODIUM CHLORIDE (POUR BTL) OPTIME
TOPICAL | Status: DC | PRN
  Administered 2014-10-31: 1000 mL

## 2014-10-31 MED ORDER — LEVOTHYROXINE SODIUM 100 MCG PO TABS
100.0000 ug | ORAL_TABLET | Freq: Every day | ORAL | Status: DC
  Administered 2014-11-01 – 2014-11-03 (×3): 100 ug via ORAL
  Filled 2014-10-31 (×3): qty 1

## 2014-10-31 MED ORDER — DEXTROSE 5 % IV SOLN
1.0000 g | Freq: Once | INTRAVENOUS | Status: AC
  Administered 2014-10-31: 1 g via INTRAVENOUS
  Filled 2014-10-31: qty 10

## 2014-10-31 MED ORDER — LIDOCAINE HCL (CARDIAC) 20 MG/ML IV SOLN
INTRAVENOUS | Status: AC
  Filled 2014-10-31: qty 5

## 2014-10-31 MED ORDER — OXYCODONE HCL 5 MG PO TABS: 5.0000 mg | ORAL_TABLET | Freq: Once | ORAL | Status: DC | PRN

## 2014-10-31 MED ORDER — SODIUM CHLORIDE 0.9 % IV SOLN
INTRAVENOUS | Status: DC
  Administered 2014-10-31 – 2014-11-02 (×2): via INTRAVENOUS

## 2014-10-31 MED ORDER — OXYBUTYNIN CHLORIDE 5 MG PO TABS: 5.0000 mg | ORAL_TABLET | Freq: Three times a day (TID) | ORAL | Status: DC | PRN

## 2014-10-31 MED ORDER — HYDROMORPHONE HCL 1 MG/ML IJ SOLN
0.5000 mg | INTRAMUSCULAR | Status: DC | PRN
  Administered 2014-10-31 – 2014-11-02 (×7): 1 mg via INTRAVENOUS
  Filled 2014-10-31 (×7): qty 1

## 2014-10-31 MED ORDER — PROMETHAZINE HCL 25 MG/ML IJ SOLN: 6.2500 mg | INTRAMUSCULAR | Status: DC | PRN

## 2014-10-31 MED ORDER — SENNOSIDES-DOCUSATE SODIUM 8.6-50 MG PO TABS
1.0000 | ORAL_TABLET | Freq: Two times a day (BID) | ORAL | Status: DC
  Administered 2014-10-31 – 2014-11-02 (×5): 1 via ORAL
  Filled 2014-10-31 (×5): qty 1

## 2014-10-31 MED ORDER — FENTANYL CITRATE 0.05 MG/ML IJ SOLN
INTRAMUSCULAR | Status: DC | PRN
  Administered 2014-10-31 (×4): 25 ug via INTRAVENOUS

## 2014-10-31 MED ORDER — HYDROCODONE-ACETAMINOPHEN 5-325 MG PO TABS
1.0000 | ORAL_TABLET | ORAL | Status: DC | PRN
  Administered 2014-11-02 – 2014-11-03 (×6): 2 via ORAL
  Filled 2014-10-31 (×6): qty 2

## 2014-10-31 MED ORDER — DEXAMETHASONE SODIUM PHOSPHATE 10 MG/ML IJ SOLN
INTRAMUSCULAR | Status: AC
  Filled 2014-10-31: qty 1

## 2014-10-31 MED ORDER — HYDROMORPHONE HCL 1 MG/ML IJ SOLN: 0.2500 mg | INTRAMUSCULAR | Status: DC | PRN

## 2014-10-31 MED ORDER — ONDANSETRON HCL 4 MG/2ML IJ SOLN
INTRAMUSCULAR | Status: AC
  Filled 2014-10-31: qty 2

## 2014-10-31 MED ORDER — PROPOFOL 10 MG/ML IV BOLUS
INTRAVENOUS | Status: AC
  Filled 2014-10-31: qty 20

## 2014-10-31 MED ORDER — PRAVASTATIN SODIUM 40 MG PO TABS
40.0000 mg | ORAL_TABLET | Freq: Every day | ORAL | Status: DC
  Administered 2014-11-01 – 2014-11-02 (×2): 40 mg via ORAL
  Filled 2014-10-31 (×3): qty 1

## 2014-10-31 MED ORDER — LIDOCAINE HCL 2 % EX GEL
CUTANEOUS | Status: AC
  Filled 2014-10-31: qty 10

## 2014-10-31 MED ORDER — TRAMADOL HCL 50 MG PO TABS
50.0000 mg | ORAL_TABLET | Freq: Once | ORAL | Status: AC
  Administered 2014-10-31: 50 mg via ORAL

## 2014-10-31 MED ORDER — LIDOCAINE HCL (CARDIAC) 20 MG/ML IV SOLN
INTRAVENOUS | Status: DC | PRN
Start: 2014-10-31 — End: 2014-10-31
  Administered 2014-10-31: 100 mg via INTRAVENOUS

## 2014-10-31 MED ORDER — INFLUENZA VAC SPLIT QUAD 0.5 ML IM SUSY
0.5000 mL | PREFILLED_SYRINGE | INTRAMUSCULAR | Status: DC
  Filled 2014-10-31 (×3): qty 0.5

## 2014-10-31 MED ORDER — CIPROFLOXACIN HCL 250 MG PO TABS
500.0000 mg | ORAL_TABLET | Freq: Once | ORAL | Status: AC
  Administered 2014-10-31: 500 mg via ORAL
  Filled 2014-10-31: qty 2

## 2014-10-31 MED ORDER — ONDANSETRON HCL 4 MG/2ML IJ SOLN
4.0000 mg | INTRAMUSCULAR | Status: DC | PRN
  Administered 2014-10-31: 4 mg via INTRAVENOUS
  Filled 2014-10-31: qty 2

## 2014-10-31 MED ORDER — LACTATED RINGERS IV SOLN
INTRAVENOUS | Status: DC | PRN
  Administered 2014-10-31 (×2): via INTRAVENOUS

## 2014-10-31 MED ORDER — PROPOFOL 10 MG/ML IV BOLUS
INTRAVENOUS | Status: DC | PRN
  Administered 2014-10-31: 150 mg via INTRAVENOUS

## 2014-10-31 SURGICAL SUPPLY — 27 items
BAG URINE DRAINAGE (UROLOGICAL SUPPLIES) ×3 IMPLANT
BAG URO CATCHER STRL LF (DRAPE) ×3 IMPLANT
CATH FOLEY 3WAY 30CC 24FR (CATHETERS) ×2
CATH INTERMIT  6FR 70CM (CATHETERS) ×3 IMPLANT
CATH URTH STD 24FR FL 3W 2 (CATHETERS) ×1 IMPLANT
ELECT REM PT RETURN 9FT ADLT (ELECTROSURGICAL) ×3
ELECTRODE REM PT RTRN 9FT ADLT (ELECTROSURGICAL) ×1 IMPLANT
EVACUATOR MICROVAS BLADDER (UROLOGICAL SUPPLIES) IMPLANT
GLOVE BIO SURGEON STRL SZ7.5 (GLOVE) ×6 IMPLANT
GLOVE BIOGEL M STRL SZ7.5 (GLOVE) ×3 IMPLANT
GLOVE BIOGEL PI IND STRL 7.5 (GLOVE) ×1 IMPLANT
GLOVE BIOGEL PI INDICATOR 7.5 (GLOVE) ×2
GOWN STRL REUS W/TWL LRG LVL3 (GOWN DISPOSABLE) ×6 IMPLANT
GUIDEWIRE ANG ZIPWIRE 038X150 (WIRE) ×3 IMPLANT
GUIDEWIRE STR DUAL SENSOR (WIRE) ×3 IMPLANT
HOLDER FOLEY CATH W/STRAP (MISCELLANEOUS) ×3 IMPLANT
IV NS IRRIG 3000ML ARTHROMATIC (IV SOLUTION) ×15 IMPLANT
KIT ASPIRATION TUBING (SET/KITS/TRAYS/PACK) IMPLANT
KIT BASIN OR (CUSTOM PROCEDURE TRAY) ×3 IMPLANT
LOOP CUT BIPOLAR 24F LRG (ELECTROSURGICAL) ×3 IMPLANT
MANIFOLD NEPTUNE II (INSTRUMENTS) ×3 IMPLANT
NS IRRIG 1000ML POUR BTL (IV SOLUTION) ×3 IMPLANT
PACK CYSTO (CUSTOM PROCEDURE TRAY) ×3 IMPLANT
STENT URET 6FRX24 CONTOUR (STENTS) ×6 IMPLANT
SYRINGE IRR TOOMEY STRL 70CC (SYRINGE) ×3 IMPLANT
TUBING CONNECTING 10 (TUBING) ×2 IMPLANT
TUBING CONNECTING 10' (TUBING) ×1

## 2014-10-31 NOTE — Discharge Instructions (Signed)
Urinary Tract Infection °Urinary tract infections (UTIs) can develop anywhere along your urinary tract. Your urinary tract is your body's drainage system for removing wastes and extra water. Your urinary tract includes two kidneys, two ureters, a bladder, and a urethra. Your kidneys are a pair of bean-shaped organs. Each kidney is about the size of your fist. They are located below your ribs, one on each side of your spine. °CAUSES °Infections are caused by microbes, which are microscopic organisms, including fungi, viruses, and bacteria. These organisms are so small that they can only be seen through a microscope. Bacteria are the microbes that most commonly cause UTIs. °SYMPTOMS  °Symptoms of UTIs may vary by age and gender of the patient and by the location of the infection. Symptoms in young women typically include a frequent and intense urge to urinate and a painful, burning feeling in the bladder or urethra during urination. Older women and men are more likely to be tired, shaky, and weak and have muscle aches and abdominal pain. A fever may mean the infection is in your kidneys. Other symptoms of a kidney infection include pain in your back or sides below the ribs, nausea, and vomiting. °DIAGNOSIS °To diagnose a UTI, your caregiver will ask you about your symptoms. Your caregiver also will ask to provide a urine sample. The urine sample will be tested for bacteria and white blood cells. White blood cells are made by your body to help fight infection. °TREATMENT  °Typically, UTIs can be treated with medication. Because most UTIs are caused by a bacterial infection, they usually can be treated with the use of antibiotics. The choice of antibiotic and length of treatment depend on your symptoms and the type of bacteria causing your infection. °HOME CARE INSTRUCTIONS °· If you were prescribed antibiotics, take them exactly as your caregiver instructs you. Finish the medication even if you feel better after you  have only taken some of the medication. °· Drink enough water and fluids to keep your urine clear or pale yellow. °· Avoid caffeine, tea, and carbonated beverages. They tend to irritate your bladder. °· Empty your bladder often. Avoid holding urine for long periods of time. °· Empty your bladder before and after sexual intercourse. °· After a bowel movement, women should cleanse from front to back. Use each tissue only once. °SEEK MEDICAL CARE IF:  °· You have back pain. °· You develop a fever. °· Your symptoms do not begin to resolve within 3 days. °SEEK IMMEDIATE MEDICAL CARE IF:  °· You have severe back pain or lower abdominal pain. °· You develop chills. °· You have nausea or vomiting. °· You have continued burning or discomfort with urination. °MAKE SURE YOU:  °· Understand these instructions. °· Will watch your condition. °· Will get help right away if you are not doing well or get worse. °Document Released: 05/14/2005 Document Revised: 02/03/2012 Document Reviewed: 09/12/2011 °ExitCare® Patient Information ©2015 [employer]. This information is not intended to replace advice given to you by your health care provider. Make sure you discuss any questions you have with your health care provider. ° °Acute Urinary Retention °Acute urinary retention is the temporary inability to urinate. °This is a common problem in older men. As men age their prostates become larger and block the flow of urine from the bladder. This is usually a problem that has come on gradually.  °HOME CARE INSTRUCTIONS °If you are sent home with a Foley catheter and a drainage system, you will need   to discuss the best course of action with your health care provider. While the catheter is in, maintain a good intake of fluids. Keep the drainage bag emptied and lower than your catheter. This is so that contaminated urine will not flow back into your bladder, which could lead to a urinary tract infection. °There are two main types of drainage  bags. One is a large bag that usually is used at night. It has a good capacity that will allow you to sleep through the night without having to empty it. The second type is called a leg bag. It has a smaller capacity, so it needs to be emptied more frequently. However, the main advantage is that it can be attached by a leg strap and can go underneath your clothing, allowing you the freedom to move about or leave your home. °Only take over-the-counter or prescription medicines for pain, discomfort, or fever as directed by your health care provider.  °SEEK MEDICAL CARE IF: °· You develop a low-grade fever. °· You experience spasms or leakage of urine with the spasms. °SEEK IMMEDIATE MEDICAL CARE IF:  °· You develop chills or fever. °· Your catheter stops draining urine. °· Your catheter falls out. °· You start to develop increased bleeding that does not respond to rest and increased fluid intake. °MAKE SURE YOU: °· Understand these instructions. °· Will watch your condition. °· Will get help right away if you are not doing well or get worse. °Document Released: 11/10/2000 Document Revised: 08/09/2013 Document Reviewed: 01/13/2013 °ExitCare® Patient Information ©2015 [employer]. This information is not intended to replace advice given to you by your health care provider. Make sure you discuss any questions you have with your health care provider. ° °

## 2014-10-31 NOTE — ED Notes (Signed)
Patient states he was seen here last night for urinary retention and a foley catheter was placed to go home with. Patient now complains of "nothing draining" and pain at the site. Patient states he has been keeping bag below the level of his bladder.

## 2014-10-31 NOTE — Anesthesia Preprocedure Evaluation (Addendum)
Anesthesia Evaluation  Patient identified by MRN, date of birth, ID band Patient awake    Reviewed: Allergy & Precautions, NPO status , Patient's Chart, lab work & pertinent test results  History of Anesthesia Complications Negative for: history of anesthetic complications  Airway Mallampati: II  TM Distance: >3 FB Neck ROM: Full    Dental  (+) Poor Dentition, Missing, Dental Advisory Given   Pulmonary former smoker,    Pulmonary exam normal       Cardiovascular negative cardio ROS      Neuro/Psych negative neurological ROS  negative psych ROS   GI/Hepatic Neg liver ROS, PUD, GERD-  ,  Endo/Other  Hypothyroidism   Renal/GU Renal InsufficiencyRenal disease     Musculoskeletal   Abdominal   Peds  Hematology   Anesthesia Other Findings   Reproductive/Obstetrics                            Anesthesia Physical Anesthesia Plan  ASA: III  Anesthesia Plan: General   Post-op Pain Management:    Induction: Intravenous  Airway Management Planned: Oral ETT  Additional Equipment:   Intra-op Plan:   Post-operative Plan: Extubation in OR  Informed Consent: I have reviewed the patients History and Physical, chart, labs and discussed the procedure including the risks, benefits and alternatives for the proposed anesthesia with the patient or authorized representative who has indicated his/her understanding and acceptance.   Dental advisory given  Plan Discussed with: CRNA, Anesthesiologist and Surgeon  Anesthesia Plan Comments:        Anesthesia Quick Evaluation

## 2014-10-31 NOTE — Brief Op Note (Signed)
10/31/2014  8:27 PM  PATIENT:  Benjamin [name]  81 y.o. male  PRE-OPERATIVE DIAGNOSIS:  hematuria, hydronephrosis  POST-OPERATIVE DIAGNOSIS:  HEMATURIA  PROCEDURE:  Procedure(s): CYSTO, BLADDER BIOPSY/CLOT EVACUATION, , BILATERAL RETROGRADE PYELOGRAM,BILATERAL URETERAL STENT PLACEMENT (Bilateral)  SURGEON:  Surgeon(s) and Role:    * Sebastian Acheheodore Kaydyn Chism, MD - Primary  PHYSICIAN ASSISTANT:   ASSISTANTS: none   ANESTHESIA:   general  EBL:  Total I/O In: 1000 [I.V.:1000] Out: -   BLOOD ADMINISTERED:none  DRAINS: 88F 3 way foley to NS irrigation   LOCAL MEDICATIONS USED:  NONE  SPECIMEN:  Source of Specimen:  bladder erythema  DISPOSITION OF SPECIMEN:  PATHOLOGY  COUNTS:  YES  TOURNIQUET:  * No tourniquets in log *  DICTATION: .Other Dictation: Dictation Number [id number]  PLAN OF CARE: Admit to inpatient   PATIENT DISPOSITION:  PACU - hemodynamically stable.   Delay start of Pharmacological VTE agent (>24hrs) due to surgical blood loss or risk of bleeding: yes

## 2014-10-31 NOTE — Anesthesia Postprocedure Evaluation (Signed)
Anesthesia Post Note  Patient: Roselind MessierWillie C Rane  Procedure(s) Performed: Procedure(s) (LRB): CYSTO, BLADDER BIOPSY/CLOT EVACUATION, , BILATERAL RETROGRADE PYELOGRAM,BILATERAL URETERAL STENT PLACEMENT (Bilateral)  Anesthesia type: general  Patient location: PACU  Post pain: Pain level controlled  Post assessment: Patient's Cardiovascular Status Stable  Last Vitals:  Filed Vitals:   10/31/14 2145  BP: 131/63  Pulse: 99  Temp: 36.9 C  Resp: 12    Post vital signs: Reviewed and stable  Level of consciousness: sedated  Complications: No apparent anesthesia complications

## 2014-10-31 NOTE — Anesthesia Procedure Notes (Signed)
Procedure Name: Intubation Date/Time: 10/31/2014 7:08 PM Performed by: Doran ClayALDAY, Phuong Moffatt R Pre-anesthesia Checklist: Patient identified, Emergency Drugs available, Suction available, Patient being monitored and Timeout performed Patient Re-evaluated:Patient Re-evaluated prior to inductionOxygen Delivery Method: Circle system utilized Preoxygenation: Pre-oxygenation with 100% oxygen Intubation Type: IV induction and Rapid sequence Laryngoscope Size: Mac and 4 Grade View: Grade I Tube type: Oral Tube size: 7.5 mm Number of attempts: 1 Airway Equipment and Method: Stylet Placement Confirmation: ETT inserted through vocal cords under direct vision,  positive ETCO2 and breath sounds checked- equal and bilateral Secured at: 21 cm Tube secured with: Tape Dental Injury: Teeth and Oropharynx as per pre-operative assessment

## 2014-10-31 NOTE — ED Notes (Signed)
Pt states he has not urinated but very small amounts in two days and c/o lower abd pain.

## 2014-10-31 NOTE — Transfer of Care (Signed)
Immediate Anesthesia Transfer of Care Note  Patient: Benjamin [name]  Procedure(s) Performed: Procedure(s): CYSTO, BLADDER BIOPSY/CLOT EVACUATION, , BILATERAL RETROGRADE PYELOGRAM,BILATERAL URETERAL STENT PLACEMENT (Bilateral)  Patient Location: PACU  Anesthesia Type:General  Level of Consciousness: sedated  Airway & Oxygen Therapy: Patient Spontanous Breathing and Patient connected to face mask oxygen  Post-op Assessment: Report given to RN and Post -op Vital signs reviewed and stable  Post vital signs: Reviewed and stable  Last Vitals:  Filed Vitals:   10/31/14 1712  BP: 158/98  Pulse: 121  Temp: 37.1 C  Resp: 17    Complications: No apparent anesthesia complications

## 2014-10-31 NOTE — ED Notes (Signed)
Urologist at bedside.

## 2014-10-31 NOTE — ED Provider Notes (Signed)
Pt was [facility] ED [facility] after a visit at [facility] ED for further evaluation of hematuria, urinary retention and a CT scan showing abnormal bladder wall thickening concerning for bladder CA.  Pt is comfortable at this time.   Alert in no distress Urinary catheter bag with bloody urine  Pt was sent to see urology.  Will consult with urology on call.  Linwood DibblesJon Cheryel Kyte, MD 10/31/14 [phone]

## 2014-10-31 NOTE — ED Notes (Signed)
Pts catheter was scanned and showed around 700 mL of fluids. Nurse irrigated catheter with 800 mL drainage to come back. MD made aware.

## 2014-10-31 NOTE — ED Notes (Signed)
Urologist states that patient should be going to OR. Unknown what time.

## 2014-10-31 NOTE — ED Notes (Signed)
Bladder scan was done by Sharlette DenseJohn Wicker, Nt and approx 715ml of urine in bladder.

## 2014-10-31 NOTE — H&P (Signed)
Benjamin [name] is an 81 y.o. male.    Chief Complaint: Gross Hematuria with Clot Retention, Bilateral Hydronephrosis with Acute Renal Failure, Prostatic Hypertrophy,   HPI:   1 - Gross Hematuria with Clot Retention - worsening gross hematuria for several weeks culminating in frank retention yesterday. Had foley palced at [facility] but this clotted off after returnting home. CT 10/31/14 with massive formed bladder clot, bilateral hydro to bladder, questionable bladder mass. Non-smoker. No blood thinners.   2 - Bilateral Hydronephrosis with Acute Renal Failure - Baseline Cr abotu 1.0. Cr to 1.4 ER labs 10/31/14 with bilat hydro to bladder, Lt>Rt.   3 - Prostatic Hypertrophy - h/o TURP by Benjamin [name] in [place] 2010, path benign, no baseline bothesome voidign symptoms.  PMH sig for hypthyroidism. NO CV disease. No blood thinners. 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 His PCP is Benjamin Shire MD in [place].  Today Benjamin [name] is seen for above as ER consult / admission. Last meal yesterday, few sips of water >6 hrs ago.  Past Medical History  Diagnosis Date  . Hypercholesteremia   . Thyroid disease     Past Surgical History  Procedure Laterality Date  . Back surgery      History reviewed. No pertinent family history. Social History:  reports that he has quit smoking. He does not have any smokeless tobacco history on file. He reports that he does not drink alcohol or use illicit drugs.  Allergies: No Known Allergies   (Not in a hospital admission)  Results for orders placed or performed during the hospital encounter of 10/31/14 (from the past 48 hour(s))  Basic metabolic panel     Status: Abnormal   Collection Time: 10/31/14  1:55 PM  Result Value Ref Range   Sodium 129 (L) 135 - 145 mmol/L   Potassium 4.7 3.5 - 5.1 mmol/L   Chloride 99 96 - 112 mmol/L   CO2 21 19 - 32 mmol/L   Glucose, Bld 151 (H) 70 - 99 mg/dL   BUN 58 (H) 6 - 23 mg/dL   Creatinine, Ser 1.39 (H) 0.50 - 1.35 mg/dL   Calcium 8.3 (L) 8.4 - 10.5 mg/dL   GFR calc non Af Amer 47 (L) >90 mL/min   GFR calc Af Amer 54 (L) >90 mL/min    Comment: (NOTE) The eGFR has been calculated using the CKD EPI equation. This calculation has not been validated in all clinical situations. eGFR's persistently <90 mL/min signify possible Chronic Kidney Disease.    Anion gap 9 5 - 15  CBC with Differential     Status: Abnormal   Collection Time: 10/31/14  1:55 PM  Result Value Ref Range   WBC 16.5 (H) 4.0 - 10.5 K/uL   RBC 4.40 4.22 - 5.81 MIL/uL   Hemoglobin 12.7 (L) 13.0 - 17.0 g/dL   HCT 37.6 (L) 39.0 - 52.0 %   MCV 85.5 78.0 - 100.0 fL   MCH 28.9 26.0 - 34.0 pg   MCHC 33.8 30.0 - 36.0 g/dL   RDW 14.3 11.5 - 15.5 %   Platelets 445 (H) 150 - 400 K/uL   Neutrophils Relative % 77 43 - 77 %   Neutro Abs 12.7 (H) 1.7 - 7.7 K/uL   Lymphocytes Relative 7 (L) 12 - 46 %   Lymphs Abs 1.1 0.7 - 4.0 K/uL   Monocytes Relative 16 (H) 3 - 12 %   Monocytes Absolute 2.7 (H) 0.1 - 1.0 K/uL  Eosinophils Relative 0 0 - 5 %   Eosinophils Absolute 0.0 0.0 - 0.7 K/uL   Basophils Relative 0 0 - 1 %   Basophils Absolute 0.0 0.0 - 0.1 K/uL   Ct Renal Stone Study  10/31/2014   CLINICAL DATA:  Decreased urine output and lower abdominal pain  EXAM: CT ABDOMEN AND PELVIS WITHOUT CONTRAST  TECHNIQUE: Multidetector CT imaging of the abdomen and pelvis was performed following the standard protocol without IV contrast.  COMPARISON:  None  FINDINGS: The lung bases are free of acute infiltrate or sizable effusion.  A hiatal hernia is noted.  The liver shows evidence of a few small cysts. The spleen, pancreas and adrenal glands are unremarkable. The gallbladder is well distended with multiple gallstones within. No gallbladder wall thickening or pericholecystic fluid is noted. The kidneys are well visualized bilaterally and demonstrate bilateral renal cystic change. Significant hydronephrotic change and  hydroureter is noted bilaterally. This extends to the level of the urinary bladder. A Foley catheter is noted within the bladder although a considerable amount of hyperdense material is noted likely representing thrombus. Wall thickening is noted with some inflammatory changes surrounding the bladder wall. These are highly suspicious for underlying neoplasm. Direct visualization is recommended. No definitive lymphadenopathy is noted within the pelvis. The bony structures show mild osteopenia. No metastatic lesions are seen.  IMPRESSION: Cholelithiasis without complicating factors.  Bilateral hydronephrosis with hydroureter extending to the level of the urinary bladder the bladder is distended with high attenuation material likely representing thrombus. Additionally significant bladder wall thickening and inflammatory changes surrounding the bladder are noted suggestive of underlying neoplasm. Direct visualization is recommended.   Electronically Signed   By: Inez Catalina M.D.   On: 10/31/2014 14:28    Review of Systems  Constitutional: Negative.  Negative for fever and chills.  HENT: Negative.   Eyes: Negative.   Respiratory: Negative.   Cardiovascular: Negative.   Gastrointestinal: Negative.   Genitourinary: Positive for hematuria. Negative for flank pain.  Musculoskeletal: Negative.   Skin: Negative.   Neurological: Negative.   Endo/Heme/Allergies: Negative.   Psychiatric/Behavioral: Negative.     Blood pressure 158/98, pulse 121, temperature 98.7 F (37.1 C), temperature source Oral, resp. rate 17, height 5' 7"  (1.702 m), weight 63.504 kg (140 lb), SpO2 94 %. Physical Exam  Constitutional: He appears well-developed.  Thin, AOx3, wife and son at bedside  HENT:  Head: Normocephalic.  Eyes: Pupils are equal, round, and reactive to light.  Neck: Normal range of motion.  Cardiovascular: Normal rate.   Respiratory: Effort normal.  GI: Soft.  Genitourinary: Penis normal.  Catheter in place  with dark clot in foley bag. No SP TTP. No CVAT.  Musculoskeletal: Normal range of motion.  Neurological: He is alert.  Skin: Skin is warm.  Psychiatric: He has a normal mood and affect. His behavior is normal. Judgment and thought content normal.     Assessment/Plan  1 - Gross Hematuria with Clot Retention - very large volume clot. Rec urgent cysto, clot eval, fulgeration v. TURBT and bilateral retrogrades / stents with diagnostic and theraputic intent. DDX includes prostate bleeding, bladder cancer, severe UTI, though most concerning for malignancy. LIkely admit after on bladder irrigation.   Risks including bleeding, infection, damage to kidney / ureter / bladder, non-diagnosis, need for staged procedure, need for tubes / catheters, as well as rare risks such as DVT, PE, MI, Mortality discussed.    2 - Bilateral Hydronephrosis with Acute Renal Failure - appears  obstructive, plan as per above.  3 - Prostatic Hypertrophy - minimal baseline bother, verify anatomy as per above.     Goldie Tregoning 10/31/2014, 5:18 PM

## 2014-10-31 NOTE — ED Provider Notes (Signed)
CSN: [id number]     Arrival date & time 10/31/14  4098 History   First MD Initiated Contact with Patient 10/31/14 0340     Chief Complaint  Patient presents with  . Urinary Retention     (Consider location/radiation/quality/duration/timing/severity/associated sxs/prior Treatment) HPI Patient presents with several days of difficulty urinating and dribbling. He has had increased lower abdominal pressure and distention. He has been on that unable to urinate this evening. No fever or chills. Nausea but denies any vomiting. Past Medical History  Diagnosis Date  . Hypercholesteremia   . Thyroid disease    Past Surgical History  Procedure Laterality Date  . Back surgery     History reviewed. No pertinent family history. History  Substance Use Topics  . Smoking status: Former Games developer  . Smokeless tobacco: Not on file  . Alcohol Use: No    Review of Systems  Constitutional: Negative for fever and chills.  Respiratory: Negative for shortness of breath.   Cardiovascular: Negative for chest pain.  Gastrointestinal: Positive for nausea, abdominal pain and abdominal distention. Negative for vomiting, diarrhea and constipation.  Genitourinary: Positive for difficulty urinating.  Musculoskeletal: Negative for back pain, neck pain and neck stiffness.  Skin: Negative for rash and wound.  Neurological: Negative for dizziness, weakness, light-headedness, numbness and headaches.  All other systems reviewed and are negative.     Allergies  Review of patient's allergies indicates not on file.  Home Medications   Prior to Admission medications   Medication Sig Start Date End Date Taking? Authorizing Provider  ciprofloxacin (CIPRO) 500 MG tablet Take 1 tablet (500 mg total) by mouth 2 (two) times daily. One po bid x 7 days 10/31/14   Loren Racer, MD   BP 144/88 mmHg  Pulse 114  Temp(Src) 97.6 F (36.4 C)  Resp 18  Ht  (1.753 m)  Wt 140 lb (63.504 kg)  BMI 20.67 kg/m2  SpO2  97% Physical Exam  Constitutional: He is oriented to person, place, and time. He appears well-developed and well-nourished. No distress.  HENT:  Head: Normocephalic and atraumatic.  Mouth/Throat: Oropharynx is clear and moist.  Eyes: EOM are normal. Pupils are equal, round, and reactive to light.  Neck: Normal range of motion. Neck supple.  Cardiovascular: Normal rate and regular rhythm.   Pulmonary/Chest: Effort normal and breath sounds normal. No respiratory distress. He has no wheezes. He has no rales.  Abdominal: Soft. Bowel sounds are normal. He exhibits distension (suprapubic distention and tenderness). He exhibits no mass. There is tenderness. There is no rebound and no guarding.  Musculoskeletal: Normal range of motion. He exhibits no edema or tenderness.  Neurological: He is alert and oriented to person, place, and time.  Moves all extremities without deficit. Sensation grossly intact.  Skin: Skin is warm and dry. No rash noted. No erythema.  Psychiatric: He has a normal mood and affect. His behavior is normal.  Nursing note and vitals reviewed.   ED Course  Procedures (including critical care time) Labs Review Labs Reviewed  URINALYSIS, ROUTINE W REFLEX MICROSCOPIC - Abnormal; Notable for the following:    Color, Urine BROWN (*)    APPearance CLOUDY (*)    pH 8.5 (*)    Glucose, UA 100 (*)    Hgb urine dipstick LARGE (*)    Bilirubin Urine LARGE (*)    Ketones, ur 15 (*)    Protein, ur >300 (*)    Urobilinogen, UA 4.0 (*)    Nitrite POSITIVE (*)  Leukocytes, UA LARGE (*)    All other components within normal limits  URINE MICROSCOPIC-ADD ON - Abnormal; Notable for the following:    Bacteria, UA MANY (*)    All other components within normal limits    Imaging Review No results found.   EKG Interpretation None      MDM   Final diagnoses:  UTI (lower urinary tract infection)  Urinary retention    Full catheter placed with 1 L of urine drained. Patient  states he feels much better. Foley drains bloody urine. We'll leave Foley in place and have follow-up with urology. UA with evidence of UTI. Will initiate treatment for UTI. Return precautions given.    Loren Raceravid Eoin Willden, MD 10/31/14 [phone]

## 2014-11-01 ENCOUNTER — Encounter (HOSPITAL_COMMUNITY): Payer: Self-pay | Admitting: Urology

## 2014-11-01 LAB — BASIC METABOLIC PANEL
ANION GAP: 9 (ref 5–15)
BUN: 50 mg/dL — ABNORMAL HIGH (ref 6–23)
CALCIUM: 7.7 mg/dL — AB (ref 8.4–10.5)
CO2: 22 mmol/L (ref 19–32)
Chloride: 100 mmol/L (ref 96–112)
Creatinine, Ser: 1.3 mg/dL (ref 0.50–1.35)
GFR calc Af Amer: 59 mL/min — ABNORMAL LOW (ref >90)
GFR calc non Af Amer: 51 mL/min — ABNORMAL LOW (ref >90)
GLUCOSE: 165 mg/dL — AB (ref 70–99)
POTASSIUM: 4.7 mmol/L (ref 3.5–5.1)
Sodium: 131 mmol/L — ABNORMAL LOW (ref 135–145)

## 2014-11-01 LAB — HEMOGLOBIN AND HEMATOCRIT, BLOOD
HEMATOCRIT: 30.7 % — AB (ref 39.0–52.0)
Hemoglobin: 10.3 g/dL — ABNORMAL LOW (ref 13.0–17.0)

## 2014-11-01 LAB — MRSA PCR SCREENING: MRSA by PCR: NEGATIVE

## 2014-11-01 MED ORDER — ENSURE COMPLETE PO LIQD
237.0000 mL | Freq: Two times a day (BID) | ORAL | Status: DC
  Administered 2014-11-01 – 2014-11-02 (×3): 237 mL via ORAL

## 2014-11-01 NOTE — Op Note (Signed)
NAMEMarland Kitchen  Benjamin, [name] NO.:  [id number]  MEDICAL RECORD NO.:  [id number]  LOCATION:                                 FACILITY:  PHYSICIAN:  Benjamin Ache, MD     DATE OF BIRTH:  1932/11/01  DATE OF PROCEDURE:  10/31/2014                               OPERATIVE REPORT   PREOPERATIVE DIAGNOSES:  Clot urinary retention, bilateral hydronephrosis, acute renal failure.  POSTOPERATIVE DIAGNOSES:  Clot urinary retention, bilateral hydronephrosis, acute renal failure plus hemorrhagic cystitis.  PROCEDURES: 1. Cystoscopy with bilateral retrograde pyelogram interpretation. 2. Insertion of bilateral ureteral stents 6 x 24, no tether. 3. Clot evacuation with fulguration of bleeders. 4. Bladder biopsy.  ESTIMATED BLOOD LOSS:  300 mL formed clot, 100 mL additional intraoperative.  DRAINS: 1. A 24-French 3-way Foley catheter. 2. Normal saline irrigation 1 drop per second.  PREOPERATIVE FINDINGS: 1. Diffuse bladder erythema consistent with hemorrhagic cystitis from     infectious versus neoplastic origin.  No focal bladder masses. 2. Bilateral hydronephrosis with tortuous ureters consistent with     likely non-malignant obstruction. 3. Cystoscopy placed in bilateral ureteral stents. 4. Prior TURP defect.  INDICATION:  Benjamin [name] is a 81 year old gentleman with recent history of gross hematuria.  It has been increasing significantly, for the past few days, he developed frank clot retention and presented to the [facility] in [place] where Foley catheter was placed.  He re- presented several hours later and continued clot retention.  CT scan revealed bilateral hydronephrosis, massive volume of formed clot in the urinary bladder.  He was found to be in acute renal failure with a raise in creatinine 2.4 from his baseline of less than 1.  Given his constellation of findings, it was felt that he will be best managed at a larger center, as such, he was  transferred to [facility] for evaluation.  He was seen in emergency room, found to be in clot retention.  From unclear source, this did not appear to be amenable to bedside management felt that operative management was clearly warranted with goal of clot removal, renal decompression, and further diagnostic and possible bladder biopsy and informed consent was obtained and placed in medical record.  PROCEDURE IN DETAIL:  The patient being Benjamin [name], verified, procedure being cysto bilateral retrogrades, bilateral stents, clot evacuation, bladder biopsies confirmed.  Procedure was carried out. Time-out was performed.  Intravenous antibiotics were administered. General anesthesia was introduced.  Patient was placed into a low lithotomy position.  Sterile field was created by prepping and draping the patient's penis, perineum, proximal thighs using iodine x3.  Next, cystourethroscopy was performed using a 26-French resectoscope sheath with visual obturator and 30 degree offset lens.  Inspection of the anterior and posterior urethra revealed prior TURP defect without bleeding or abnormalities in light of the prostatic fossa.  Inspection of urinary bladder revealed a massive volume of formed clot, this was evacuated sequentially using a Toomey syringe.  Approximately 300 mL of formed clot was evacuated.  Next, panendoscopic examination of the bladder revealed a very boggy diffusely erythematous bladder.  There were no focal masses, this was consistent with hemorrhagic cystitis from infectious versus  possible carcinoma in normal anatomic position. Again, no evidence of mass over these fulguration was performed of the most friable and erythematous bleeding aspects of bladder approximately 20% total circumference resulted in much better hemostasis with only mild mucosal bleeding.  Attention was then directed to bilateral retrograde pyelogram, first on the left side, left ureteral orifice  was cannulated with 6-French end-hole catheter and left retrograde pyelogram was obtained.  Left retrograde pyelogram demonstrated very tortuous left ureter with hydronephrosis without ureteral nephrosis.  A 0.038 Zip wire was advanced at the level of the upper pole over which a new 6 x 24, Contour type stent was placed.  Proximal and distal deployment were noted. Similar right retrograde pyelogram was obtained.  Right retrograde pyelogram demonstrated a single right ureter, single system right kidney, was also very tortuous, ureter with hydronephrosis without ureteral nephrosis.  No filling defects noted as the goal was maximum renal decompression.  A 0.038 Zip wire was advanced at the level of upper pole over which a new 6 x 24 Contour-type stent was placed. Good proximal and distal deployment were noted.  Next, the bipolar resectoscope loop which had also been previously used for the aforementioned fulguration was used to perform representative seromuscular swipes of the bladder erythema, these specimens were set aside for permanent pathology labeled bladder erythema.  The base of this area was cauterized.  There was no evidence of bladder perforation. Following these maneuvers, hemostasis appeared acceptable.  There was no evidence of bladder perforation.  The resectoscope was exchanged for new 24-French 3-way Foley catheter, 20 mL sterile water in the balloon. This connected to normal saline irrigation, efflux light pink. Procedure was terminated.  The patient tolerated procedure well.  There were no immediate periprocedural complications.  The patient was taken to postanesthesia care unit in stable condition.  Notably, he was type and crossed intraoperatively and will be admitted for serial monitoring of hemoglobin, renal function and hematuria.          ______________________________ Benjamin Acheheodore Kerston Landeck, MD     TM/MEDQ  D:  10/31/2014  T:  11/01/2014  Job:  [id number]

## 2014-11-01 NOTE — Progress Notes (Signed)
1 Day Post-Op  Subjective:  1 - Gross Hematuria with Clot Retention - s/p cysto with clot evacuation / fulgeration / bladder biopsy 3/15 for large volume hematuria with clot retention for hemorrhagic cystits (likely infectious v. Malignant). Transitioned to NS bladder irrigation post-op. Path pending.   2 - Bilateral Hydronephrosis with Acute Renal Failure - Baseline Cr abotu 1.0. Cr to 1.4 ER labs 10/31/14 with bilat hydro without ureteral dilation. 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 Stents placed to maximally drain.    3 - Prostatic Hypertrophy - h/o TURP by Javaid in [place] 2010, path benign, no baseline bothesome voidign symptoms. No prostatic fossa bleeding or sig regrowth by cysto this admission.   Today [place] is swithotu complaints. Hgb approx stable, still on bladder irrivation, improved.   Objective: Vital signs in last 24 hours: Temp:  [97.5 F (36.4 C)-99.5 F (37.5 C)] 97.5 F (36.4 C) (03/16 0649) Pulse Rate:  [60-121] 78 (03/16 0649) Resp:  [12-18] 18 (03/16 0649) BP: (105-158)/(54-98) 148/62 mmHg (03/16 0649) SpO2:  [92 %-100 %] 99 % (03/16 0649) Weight:  [60.4 kg (133 lb 2.5 oz)-63.504 kg (140 lb)] 60.4 kg (133 lb 2.5 oz) (03/15 2304) Last BM Date: 10/31/14  Intake/Output from previous day: 03/15 0701 - 03/16 0700 In: 6750 [I.V.:1650] Out: 41324 [Urine:11775] Intake/Output this shift: Total I/O In: 6750 [I.V.:1650; Other:5100] Out: 40102 [Urine:11775]  General appearance: alert, cooperative, appears stated age and wife at bedside Eyes: negative Throat: lips, mucosa, and tongue normal; teeth and gums normal Neck: supple, symmetrical, trachea midline Back: symmetric, no curvature. ROM normal. No CVA tenderness. Resp: non-labored on room air Cardio: Nl rate GI: soft, non-tender; bowel sounds normal; no masses,  no organomegaly Male genitalia: normal, 3 way foley c/d/i on NS irrigation abtou 2 drops per  second, efflux pink. Minial clots.  Extremities: extremities normal, atraumatic, no cyanosis or edema Pulses: 2+ and symmetric Skin: Skin color, texture, turgor normal. No rashes or lesions Lymph nodes: Cervical, supraclavicular, and axillary nodes normal. Neurologic: Grossly normal  Lab Results:   Recent Labs  10/31/14 1355 10/31/14 2120 11/01/14 0431  WBC 16.5*  --   --   HGB 12.7* 10.4* 10.3*  HCT 37.6* 31.4* 30.7*  PLT 445*  --   --    BMET  Recent Labs  10/31/14 1355 11/01/14 0431  NA 129* 131*  K 4.7 4.7  CL 99 100  CO2 21 22  GLUCOSE 151* 165*  BUN 58* 50*  CREATININE 1.39* 1.30  CALCIUM 8.3* 7.7*   PT/INR No results for input(s): LABPROT, INR in the last 72 hours. ABG No results for input(s): PHART, HCO3 in the last 72 hours.  Invalid input(s): PCO2, PO2  Studies/Results: Ct Renal Stone Study  10/31/2014   CLINICAL DATA:  Decreased urine output and lower abdominal pain  EXAM: CT ABDOMEN AND PELVIS WITHOUT CONTRAST  TECHNIQUE: Multidetector CT imaging of the abdomen and pelvis was performed following the standard protocol without IV contrast.  COMPARISON:  None  FINDINGS: The lung bases are free of acute infiltrate or sizable effusion.  A hiatal hernia is noted.  The liver shows evidence of a few small cysts. The spleen, pancreas and adrenal glands are unremarkable. The gallbladder is well distended with multiple gallstones within. No gallbladder wall thickening or pericholecystic fluid is noted. The kidneys are well visualized bilaterally and demonstrate bilateral renal cystic change. Significant hydronephrotic change and hydroureter is noted bilaterally. This extends to the  level of the urinary bladder. A Foley catheter is noted within the bladder although a considerable amount of hyperdense material is noted likely representing thrombus. Wall thickening is noted with some inflammatory changes surrounding the bladder wall. These are highly suspicious for underlying  neoplasm. Direct visualization is recommended. No definitive lymphadenopathy is noted within the pelvis. The bony structures show mild osteopenia. No metastatic lesions are seen.  IMPRESSION: Cholelithiasis without complicating factors.  Bilateral hydronephrosis with hydroureter extending to the level of the urinary bladder the bladder is distended with high attenuation material likely representing thrombus. Additionally significant bladder wall thickening and inflammatory changes surrounding the bladder are noted suggestive of underlying neoplasm. Direct visualization is recommended.   Electronically Signed   By: Alcide CleverMark  Lukens M.D.   On: 10/31/2014 14:28    Anti-infectives: Anti-infectives    Start     Dose/Rate Route Frequency Ordered Stop   11/01/14 1800  cefTRIAXone (ROCEPHIN) 1 g in dextrose 5 % 50 mL IVPB - Premix     1 g 100 mL/hr over 30 Minutes Intravenous Every 24 hours 10/31/14 2245     10/31/14 1745  cefTRIAXone (ROCEPHIN) 1 g in dextrose 5 % 50 mL IVPB     1 g 100 mL/hr over 30 Minutes Intravenous  Once 10/31/14 1731 10/31/14 1812      Assessment/Plan:  1 - Gross Hematuria with Clot Retention - contiue CBI and empiric TX for UTI, path penidng. Goal is to wean to off and trial of void before discharge, this will likely take few days. Continue daily labs. Has blood avail if sig Hgb drop.   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 Follow labs.    3 - Prostatic Hypertrophy - no recurrent obsruction.  4 - Remain in house.  [facility] - [facility] RougeMANNY, Josefita Weissmann 11/01/2014

## 2014-11-01 NOTE — Care Management Note (Addendum)
    Page 1 of 1   11/03/2014     3:36:49 PM CARE MANAGEMENT NOTE 11/03/2014  Patient:  Benjamin Benjamin [name],Benjamin Benjamin [name]   Account Number:  [id number]  Date Initiated:  11/01/2014  Documentation initiated by:  Benjamin ClamMAHABIR,Benjamin Benjamin [name]  Subjective/Objective Assessment:   81 y/o m admitted w/gross hematuria.     Action/Plan:   From home.   Anticipated DC Date:  11/03/2014   Anticipated DC Plan:  HOME/SELF CARE      DC Planning Services  CM consult      Choice offered to / List presented to:             Status of service:  Completed, signed off Medicare Important Message given?  YES (If response is "NO", the following Medicare IM given date fields will be blank) Date Medicare IM given:  11/03/2014 Medicare IM given by:  Benjamin Benjamin [name] LLCMAHABIR,Benjamin Benjamin [name] Date Additional Medicare IM given:   Additional Medicare IM given by:    Discharge Disposition:  HOME/SELF CARE  Per UR Regulation:  Reviewed for med. necessity/level of care/duration of stay  If discussed at Long Length of Stay Meetings, dates discussed:    Comments:  11/03/14 Benjamin ClamKathy Benjamin Schriefer RN BSN NCM [phone] d/Benjamin [name] home no needs or orders.  11/01/14 Benjamin ClamKathy Benjamin Pelto RN BSN NCM [phone] POD#1 cystoscopy. No anticipated d/Benjamin [name] needs.

## 2014-11-01 NOTE — Progress Notes (Signed)
INITIAL NUTRITION ASSESSMENT  DOCUMENTATION CODES Per approved criteria  -Severe malnutrition in the context of acute illness or injury  Pt meets criteria for severe MALNUTRITION in the context of acute illness as evidenced by moderate fat and muscle depletion and 8% weight loss x 1 month .  INTERVENTION: Provide Ensure Enlive po BID, each supplement provides 350 kcal and 20 grams of protein Encourage PO intake RD to continue to monitor  NUTRITION DIAGNOSIS: Unintentional weight loss related to acute illness as evidenced by 8% weight loss x 1 month.   Goal: Pt to meet >/= 90% of their estimated nutrition needs   Monitor:  PO and supplemental intake, weight, labs, I/O's  Reason for Assessment: Pt identified as at nutrition risk on the Malnutrition Screen Tool  Admitting Dx: hematuria  ASSESSMENT: 81 y.o. Male admitted with Gross Hematuria with Clot Retention, Bilateral Hydronephrosis with Acute Renal Failure, Prostatic Hypertrophy.    S/p 3/15 Procedure(s): CYSTO, BLADDER BIOPSY/CLOT EVACUATION, , BILATERAL RETROGRADE PYELOGRAM,BILATERAL URETERAL STENT PLACEMENT (Bilateral)  Pt reports eating well with good appetite. PO intake: 100%. Pt just finished his lunch prior to RD visit.   Per pt and pt's wife, pt has lost 20 lb over the last month. Per UBW, pt has lost 12 lb (8% weight loss x 1 month, significant for time frame).  Per pt's wife, he drinks Ensure supplements daily at home. RD to order.  Nutrition Focused Physical Exam:  Subcutaneous Fat:  Orbital Region: mild depletion Upper Arm Region: moderate depletion Thoracic and Lumbar Region: NA  Muscle:  Temple Region: moderate depletion Clavicle Bone Region: moderate depletion Clavicle and Acromion Bone Region: moderate depletion Scapular Bone Region: NA Dorsal Hand: mild depletion Patellar Region: NA Anterior Thigh Region: NA Posterior Calf Region: NA  Edema: no LE edema  Labs reviewed: Low Na Elevated  BUN Glucose 165  Height: Ht Readings from Last 1 Encounters:  10/31/14  (1.702 m)    Weight: Wt Readings from Last 1 Encounters:  10/31/14 133 lb 2.5 oz (60.4 kg)    Ideal Body Weight: 148 lb  % Ideal Body Weight: 90%  Wt Readings from Last 10 Encounters:  10/31/14 133 lb 2.5 oz (60.4 kg)  10/31/14 140 lb (63.504 kg)  04/16/09 135 lb (61.236 kg)  12/11/08 129 lb (58.514 kg)  12/04/08 127 lb (57.607 kg)  11/27/08 134 lb (60.782 kg)  10/02/08 135 lb (61.236 kg)  08/25/08 133 lb (60.328 kg)  07/25/08 136 lb (61.689 kg)  07/18/08 139 lb (63.05 kg)    Usual Body Weight: 145-150 lb  % Usual Body Weight: 92%  BMI:  Body mass index is 20.85 kg/(m^2).  Estimated Nutritional Needs: Kcal: 1800-2000 Protein: 85-95g Fluid: 1.8L/day  Skin: penis incision  Diet Order: Diet regular  EDUCATION NEEDS: -No education needs identified at this time   Intake/Output Summary (Last 24 hours) at 11/01/14 1320 Last data filed at 11/01/14 1050  Gross per 24 hour  Intake   6990 ml  Output  40981 ml  Net  -6735 ml    Last BM: 3/15  Labs:   Recent Labs Lab 10/31/14 1355 11/01/14 0431  NA 129* 131*  K 4.7 4.7  CL 99 100  CO2 21 22  BUN 58* 50*  CREATININE 1.39* 1.30  CALCIUM 8.3* 7.7*  GLUCOSE 151* 165*    CBG (last 3)  No results for input(s): GLUCAP in the last 72 hours.  Scheduled Meds: . cefTRIAXone (ROCEPHIN)  IV  1 g Intravenous Q24H  .  Influenza vac split quadrivalent PF  0.5 mL Intramuscular Tomorrow-1000  . levothyroxine  100 mcg Oral Daily  . pravastatin  40 mg Oral Daily  . senna-docusate  1 tablet Oral BID    Continuous Infusions: . sodium chloride 75 mL/hr at 10/31/14 2334    Past Medical History  Diagnosis Date  . Hypercholesteremia   . Thyroid disease     Past Surgical History  Procedure Laterality Date  . Back surgery    . Transurethral resection of bladder tumor Bilateral 10/31/2014    Procedure: CYSTO, BLADDER BIOPSY/CLOT  EVACUATION, , BILATERAL RETROGRADE PYELOGRAM,BILATERAL URETERAL STENT PLACEMENT;  Surgeon: Sebastian Acheheodore Manny, MD;  Location: [facility];  Service: Urology;  Laterality: Bilateral;    Tilda FrancoLindsey Leighanna Kirn, MS, RD, LDN Pager: [phone] After Hours Pager: [phone]

## 2014-11-02 LAB — BASIC METABOLIC PANEL
Anion gap: 5 (ref 5–15)
BUN: 38 mg/dL — AB (ref 6–23)
CO2: 27 mmol/L (ref 19–32)
Calcium: 7.5 mg/dL — ABNORMAL LOW (ref 8.4–10.5)
Chloride: 98 mmol/L (ref 96–112)
Creatinine, Ser: 0.86 mg/dL (ref 0.50–1.35)
GFR calc Af Amer: >90 mL/min (ref >90)
GFR, EST NON AFRICAN AMERICAN: 80 mL/min — AB (ref >90)
Glucose, Bld: 118 mg/dL — ABNORMAL HIGH (ref 70–99)
POTASSIUM: 4.3 mmol/L (ref 3.5–5.1)
Sodium: 130 mmol/L — ABNORMAL LOW (ref 135–145)

## 2014-11-02 LAB — CBC
HCT: 24.1 % — ABNORMAL LOW (ref 39.0–52.0)
Hemoglobin: 8.2 g/dL — ABNORMAL LOW (ref 13.0–17.0)
MCH: 29.6 pg (ref 26.0–34.0)
MCHC: 34 g/dL (ref 30.0–36.0)
MCV: 87 fL (ref 78.0–100.0)
Platelets: 317 10*3/uL (ref 150–400)
RBC: 2.77 MIL/uL — ABNORMAL LOW (ref 4.22–5.81)
RDW: 14.2 % (ref 11.5–15.5)
WBC: 15.7 10*3/uL — ABNORMAL HIGH (ref 4.0–10.5)

## 2014-11-02 NOTE — Progress Notes (Signed)
2 Days Post-Op  Subjective:  1 - Gross Hematuria with Clot Retention - s/p cysto with clot evacuation / fulgeration / bladder biopsy 3/15 for large volume hematuria with clot retention for hemorrhagic cystits (likely infectious v. Malignant). Transitioned to NS bladder irrigation post-op. Path pending.   2 - Bilateral Hydronephrosis with Acute Renal Failure - Baseline Cr abotu 1.0. Cr to 1.4 ER labs 10/31/14 with bilat hydro without ureteral dilation. 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 Stents placed to maximally drain.    3 - Prostatic Hypertrophy - h/o TURP by [facility] in [place] 2010, path benign, no baseline bothesome voidign symptoms. No prostatic fossa bleeding or sig regrowth by cysto this admission.   Today [place] is swithotu complaints. Hgb down some likely due to equilibration, remains w/o clots on very slow bladder irrigation. NO fevers or complaints.   Objective: Vital signs in last 24 hours: Temp:  [97.6 F (36.4 C)-98.1 F (36.7 C)] 98 F (36.7 C) (03/17 0414) Pulse Rate:  [82-104] 82 (03/17 0414) Resp:  [16-18] 16 (03/17 0414) BP: (113-160)/(50-61) 113/50 mmHg (03/17 0414) SpO2:  [94 %-100 %] 97 % (03/17 0414) Last BM Date: 10/31/14  Intake/Output from previous day: 03/16 0701 - 03/17 0700 In: 10606.3 [P.O.:720; I.V.:1886.3] Out: 8100 [Urine:8100] Intake/Output this shift:    General appearance: alert, cooperative, appears stated age and wife at bedside Eyes: negative Nose: Nares normal. Septum midline. Mucosa normal. No drainage or sinus tenderness. Throat: lips, mucosa, and tongue normal; teeth and gums normal Back: symmetric, no curvature. ROM normal. No CVA tenderness. Resp: non-labored on room air Cardio: Nl rate GI: soft, non-tender; bowel sounds normal; no masses,  no organomegaly Male genitalia: normal, urine dark pink, no clots off irrigation.  Extremities: extremities normal, atraumatic, no cyanosis or  edema Pulses: 2+ and symmetric Skin: Skin color, texture, turgor normal. No rashes or lesions Lymph nodes: Cervical, supraclavicular, and axillary nodes normal. Neurologic: Grossly normal  Lab Results:   Recent Labs  10/31/14 1355  11/01/14 0431 11/02/14 0443  WBC 16.5*  --   --  15.7*  HGB 12.7*  < > 10.3* 8.2*  HCT 37.6*  < > 30.7* 24.1*  PLT 445*  --   --  317  < > = values in this interval not displayed. BMET  Recent Labs  11/01/14 0431 11/02/14 0443  NA 131* 130*  K 4.7 4.3  CL 100 98  CO2 22 27  GLUCOSE 165* 118*  BUN 50* 38*  CREATININE 1.30 0.86  CALCIUM 7.7* 7.5*   PT/INR No results for input(s): LABPROT, INR in the last 72 hours. ABG No results for input(s): PHART, HCO3 in the last 72 hours.  Invalid input(s): PCO2, PO2  Studies/Results: Ct Renal Stone Study  10/31/2014   CLINICAL DATA:  Decreased urine output and lower abdominal pain  EXAM: CT ABDOMEN AND PELVIS WITHOUT CONTRAST  TECHNIQUE: Multidetector CT imaging of the abdomen and pelvis was performed following the standard protocol without IV contrast.  COMPARISON:  None  FINDINGS: The lung bases are free of acute infiltrate or sizable effusion.  A hiatal hernia is noted.  The liver shows evidence of a few small cysts. The spleen, pancreas and adrenal glands are unremarkable. The gallbladder is well distended with multiple gallstones within. No gallbladder wall thickening or pericholecystic fluid is noted. The kidneys are well visualized bilaterally and demonstrate bilateral renal cystic change. Significant hydronephrotic change and hydroureter is noted bilaterally. This extends to  the level of the urinary bladder. A Foley catheter is noted within the bladder although a considerable amount of hyperdense material is noted likely representing thrombus. Wall thickening is noted with some inflammatory changes surrounding the bladder wall. These are highly suspicious for underlying neoplasm. Direct visualization is  recommended. No definitive lymphadenopathy is noted within the pelvis. The bony structures show mild osteopenia. No metastatic lesions are seen.  IMPRESSION: Cholelithiasis without complicating factors.  Bilateral hydronephrosis with hydroureter extending to the level of the urinary bladder the bladder is distended with high attenuation material likely representing thrombus. Additionally significant bladder wall thickening and inflammatory changes surrounding the bladder are noted suggestive of underlying neoplasm. Direct visualization is recommended.   Electronically Signed   By: Alcide CleverMark  Lukens M.D.   On: 10/31/2014 14:28    Anti-infectives: Anti-infectives    Start     Dose/Rate Route Frequency Ordered Stop   11/01/14 1800  cefTRIAXone (ROCEPHIN) 1 g in dextrose 5 % 50 mL IVPB - Premix     1 g 100 mL/hr over 30 Minutes Intravenous Every 24 hours 10/31/14 2245     10/31/14 1745  cefTRIAXone (ROCEPHIN) 1 g in dextrose 5 % 50 mL IVPB     1 g 100 mL/hr over 30 Minutes Intravenous  Once 10/31/14 1731 10/31/14 1812      Assessment/Plan:  1 - Gross Hematuria with Clot Retention - contiue  TX for UTI, path penidng. Stop CBI, hopefully remove catheter later today or tomorrow AM. Continue Hgb monitoring.   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 Follow labs.    3 - Prostatic Hypertrophy - no recurrent obsruction.  4 - Remain in house, possible DC tomorrow if passes trial of void and Hgb stable.   [facility], Ziah Turvey 11/02/2014

## 2014-11-03 ENCOUNTER — Ambulatory Visit: Payer: Medicare Other | Admitting: Urology

## 2014-11-03 LAB — BASIC METABOLIC PANEL
Anion gap: 6 (ref 5–15)
BUN: 30 mg/dL — ABNORMAL HIGH (ref 6–23)
CO2: 28 mmol/L (ref 19–32)
Calcium: 7.7 mg/dL — ABNORMAL LOW (ref 8.4–10.5)
Chloride: 97 mmol/L (ref 96–112)
Creatinine, Ser: 1 mg/dL (ref 0.50–1.35)
GFR calc non Af Amer: 69 mL/min — ABNORMAL LOW (ref >90)
GFR, EST AFRICAN AMERICAN: 80 mL/min — AB (ref >90)
Glucose, Bld: 107 mg/dL — ABNORMAL HIGH (ref 70–99)
Potassium: 4.4 mmol/L (ref 3.5–5.1)
SODIUM: 131 mmol/L — AB (ref 135–145)

## 2014-11-03 LAB — CBC
HCT: 21.7 % — ABNORMAL LOW (ref 39.0–52.0)
HEMOGLOBIN: 7 g/dL — AB (ref 13.0–17.0)
MCH: 28.5 pg (ref 26.0–34.0)
MCHC: 32.3 g/dL (ref 30.0–36.0)
MCV: 88.2 fL (ref 78.0–100.0)
Platelets: 308 10*3/uL (ref 150–400)
RBC: 2.46 MIL/uL — ABNORMAL LOW (ref 4.22–5.81)
RDW: 14.5 % (ref 11.5–15.5)
WBC: 12.1 10*3/uL — ABNORMAL HIGH (ref 4.0–10.5)

## 2014-11-03 MED ORDER — SENNOSIDES-DOCUSATE SODIUM 8.6-50 MG PO TABS: 1.0000 | ORAL_TABLET | Freq: Two times a day (BID) | ORAL | Status: DC

## 2014-11-03 MED ORDER — TRAMADOL HCL 50 MG PO TABS: 50.0000 mg | ORAL_TABLET | Freq: Four times a day (QID) | ORAL | Status: DC | PRN

## 2014-11-03 MED ORDER — SULFAMETHOXAZOLE-TRIMETHOPRIM 800-160 MG PO TABS: 1.0000 | ORAL_TABLET | Freq: Two times a day (BID) | ORAL | Status: DC

## 2014-11-03 NOTE — Progress Notes (Signed)
I completed the D/C teaching with the patient and answered all questions. The patient is being D/C home with family in stable condition.

## 2014-11-03 NOTE — Discharge Instructions (Signed)
1 - You may have urinary urgency (bladder spasms) and bloody urine on / off with stent in place. This is normal. ° °2 - Call MD or go to ER for fever >102, severe pain / nausea / vomiting not relieved by medications, or acute change in medical status ° °

## 2014-11-03 NOTE — Discharge Summary (Signed)
Physician Discharge Summary  Patient ID: Benjamin MessierWillie C Petit MRN: [id number] DOB/AGE: [birth year] 79 y.o.  Admit date: 10/31/2014 Discharge date: 11/03/2014  Admission Diagnoses: Hematuria with Clot Retention, Acute Renal Failure  Discharge Diagnoses:  Active Problems:   Hematuria Acute Blood Loss anemia  Discharged Condition: good  Hospital Course:   1 - Gross Hematuria with Clot Retention - s/p cysto with clot evacuation / fulgeration / bladder biopsy 3/15 for large volume hematuria with clot retention for hemorrhagic cystits. Path benign ulceration only (suspect infectious). Transitioned to NS bladder irrigation post-op which was stopped night of 3/16. Foley removed 3/17 and voiding to completion w/o clots by time of discharge.   2 - Bilateral Hydronephrosis with Acute Renal Failure - Baseline Cr abotu 1.0. Cr to 1.4 ER labs 10/31/14 with bilat hydro without ureteral dilation. 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 Stents placed to maximally drain. Cr by discharge <1.0.   3 - Prostatic Hypertrophy - h/o TURP by [facility] in  2010, path benign, no baseline bothesome voidign symptoms. No prostatic fossa bleeding or sig regrowth by cysto this admission.   4 - Acute Blood Loss Anemia - Pt with significant bleeding pre hospitalization and peri-operatively. Hgb at discharge 7.0 and without orthostasis or tachycardia or malaise and resolution of gross hematuria. Pt declines transfusion after discussing risks, benefits, undertands to go to ER should maliase, hematuria recur.   By 3/18, the day of discharge, pt ambulatory, pain controlled on PO meds, voiding to completion, and felt to be adequate for discharge.   Consults: None  Significant Diagnostic Studies: labs: Hgb 7.0 at discharge, bladder biopsy benign  Treatments: surgery: cysto with clot evacuation / fulgeration / bladder biopsy 3/15, IV ABX (rocephin)  Discharge Exam: Blood  pressure 115/50, pulse 83, temperature 98.4 F (36.9 C), temperature source Oral, resp. rate 20, height 5\' 7"  (1.702 m), weight 60.4 kg (133 lb 2.5 oz), SpO2 96 %. General appearance: alert, cooperative, appears stated age and wife at bedside Eyes: negative, wears glasses Throat: poor dentition Resp: non-labored on room air Cardio: Nl rate GI: soft, non-tender; bowel sounds normal; no masses,  no organomegaly Male genitalia: normal, very light pink urine in bedside urinal, no clots Extremities: extremities normal, atraumatic, no cyanosis or edema Pulses: 2+ and symmetric Skin: Skin color, texture, turgor normal. No rashes or lesions Neurologic: Grossly normal  Disposition: 01-Home or Self Care     Medication List    ASK your doctor about these medications        ciprofloxacin 500 MG tablet  Commonly known as:  CIPRO  Take 1 tablet (500 mg total) by mouth 2 (two) times daily. One po bid x 7 days     levothyroxine 100 MCG tablet  Commonly known as:  SYNTHROID, LEVOTHROID  Take 100 mcg by mouth daily.     pravastatin 40 MG tablet  Commonly known as:  PRAVACHOL  Take 40 mg by mouth daily.         SignedSebastian Ache: Rashmi Tallent 11/03/2014, 6:58 AM

## 2014-11-03 NOTE — Clinical Documentation Improvement (Signed)
  Per eval by Registered Nutritionist 3/17 "meets criteria for Severe protein calorie malnutrition in the context of acute illness as evidenced by moderate fat and muscle depletion and 8% weight loss x 1 month". If you agree please add to documentation to reflect severity of illness and risk of mortality.   Thank You, Clide CliffLaurie Darryn Kydd RN CDI [phone] HIM department

## 2014-11-04 LAB — TYPE AND SCREEN
ABO/RH(D): O POS
Antibody Screen: NEGATIVE
Unit division: 0
Unit division: 0

## 2014-12-02 ENCOUNTER — Encounter (HOSPITAL_COMMUNITY): Payer: Self-pay | Admitting: Emergency Medicine

## 2014-12-02 ENCOUNTER — Emergency Department (HOSPITAL_COMMUNITY)
Admission: EM | Admit: 2014-12-02 | Discharge: 2014-12-03 | Disposition: A | Discharge status: Home / Self Care | Payer: Medicare Other | Attending: Emergency Medicine | Admitting: Emergency Medicine

## 2014-12-02 DIAGNOSIS — R339 Retention of urine, unspecified: Secondary | ICD-10-CM | POA: Diagnosis present

## 2014-12-02 DIAGNOSIS — Z792 Long term (current) use of antibiotics: Secondary | ICD-10-CM | POA: Diagnosis not present

## 2014-12-02 DIAGNOSIS — N289 Disorder of kidney and ureter, unspecified: Secondary | ICD-10-CM

## 2014-12-02 DIAGNOSIS — E079 Disorder of thyroid, unspecified: Secondary | ICD-10-CM | POA: Insufficient documentation

## 2014-12-02 DIAGNOSIS — Z87891 Personal history of nicotine dependence: Secondary | ICD-10-CM | POA: Insufficient documentation

## 2014-12-02 DIAGNOSIS — E780 Pure hypercholesterolemia: Secondary | ICD-10-CM | POA: Insufficient documentation

## 2014-12-02 DIAGNOSIS — Z79899 Other long term (current) drug therapy: Secondary | ICD-10-CM | POA: Diagnosis not present

## 2014-12-02 DIAGNOSIS — D649 Anemia, unspecified: Secondary | ICD-10-CM

## 2014-12-02 LAB — BASIC METABOLIC PANEL
Anion gap: 11 (ref 5–15)
BUN: 54 mg/dL — ABNORMAL HIGH (ref 6–23)
CHLORIDE: 109 mmol/L (ref 96–112)
CO2: 19 mmol/L (ref 19–32)
Calcium: 8.3 mg/dL — ABNORMAL LOW (ref 8.4–10.5)
Creatinine, Ser: 1.47 mg/dL — ABNORMAL HIGH (ref 0.50–1.35)
GFR, EST AFRICAN AMERICAN: 51 mL/min — AB (ref >90)
GFR, EST NON AFRICAN AMERICAN: 44 mL/min — AB (ref >90)
Glucose, Bld: 194 mg/dL — ABNORMAL HIGH (ref 70–99)
Potassium: 4.2 mmol/L (ref 3.5–5.1)
SODIUM: 139 mmol/L (ref 135–145)

## 2014-12-02 LAB — URINALYSIS, ROUTINE W REFLEX MICROSCOPIC
Glucose, UA: NEGATIVE mg/dL
KETONES UR: NEGATIVE mg/dL
Nitrite: NEGATIVE
PH: 6.5 (ref 5.0–8.0)
Protein, ur: >300 mg/dL — AB
SPECIFIC GRAVITY, URINE: 1.021 (ref 1.005–1.030)
Urobilinogen, UA: 0.2 mg/dL (ref 0.0–1.0)

## 2014-12-02 LAB — CBC WITH DIFFERENTIAL/PLATELET
Basophils Absolute: 0 10*3/uL (ref 0.0–0.1)
Basophils Relative: 1 % (ref 0–1)
EOS PCT: 1 % (ref 0–5)
Eosinophils Absolute: 0.1 10*3/uL (ref 0.0–0.7)
HEMATOCRIT: 23 % — AB (ref 39.0–52.0)
HEMOGLOBIN: 7 g/dL — AB (ref 13.0–17.0)
LYMPHS PCT: 9 % — AB (ref 12–46)
Lymphs Abs: 0.4 10*3/uL — ABNORMAL LOW (ref 0.7–4.0)
MCH: 25.3 pg — ABNORMAL LOW (ref 26.0–34.0)
MCHC: 30.4 g/dL (ref 30.0–36.0)
MCV: 83 fL (ref 78.0–100.0)
Monocytes Absolute: 0.1 10*3/uL (ref 0.1–1.0)
Monocytes Relative: 2 % — ABNORMAL LOW (ref 3–12)
NEUTROS PCT: 87 % — AB (ref 43–77)
Neutro Abs: 3.9 10*3/uL (ref 1.7–7.7)
Platelets: 331 10*3/uL (ref 150–400)
RBC: 2.77 MIL/uL — AB (ref 4.22–5.81)
RDW: 15 % (ref 11.5–15.5)
WBC: 4.4 10*3/uL (ref 4.0–10.5)

## 2014-12-02 LAB — URINE MICROSCOPIC-ADD ON

## 2014-12-02 MED ORDER — ONDANSETRON HCL 4 MG/2ML IJ SOLN
4.0000 mg | Freq: Once | INTRAMUSCULAR | Status: AC
  Administered 2014-12-02: 4 mg via INTRAVENOUS
  Filled 2014-12-02: qty 2

## 2014-12-02 MED ORDER — CEPHALEXIN 500 MG PO CAPS
500.0000 mg | ORAL_CAPSULE | Freq: Once | ORAL | Status: AC
  Administered 2014-12-03: 500 mg via ORAL
  Filled 2014-12-02: qty 1

## 2014-12-02 MED ORDER — CEPHALEXIN 500 MG PO CAPS: 500.0000 mg | ORAL_CAPSULE | Freq: Three times a day (TID) | ORAL | Status: DC

## 2014-12-02 NOTE — ED Provider Notes (Signed)
CSN: [id number]     Arrival date & time 12/02/14  1907 History   First MD Initiated Contact with Patient 12/02/14 1928     Chief Complaint  Patient presents with  . Urinary Retention   HPI Patient presents to the emergency room with complaints of urinary retention. Patient has history of a similar episode of urinary retention last month. Patient ended up requiring surgery where he had cystoscopy, bladder biopsy and clot evacuation. Patient had been doing well until last few days where he started to notice blood in his urine again. The patient started developing urinary retention and has not been able to urinate properly since last evening. He feels that his abdomen is swollen. He is having pain in his lower abdomen. He denies any fevers or chills or other complaints. Past Medical History  Diagnosis Date  . Hypercholesteremia   . Thyroid disease    Past Surgical History  Procedure Laterality Date  . Back surgery    . Transurethral resection of bladder tumor Bilateral 10/31/2014    Procedure: CYSTO, BLADDER BIOPSY/CLOT EVACUATION, , BILATERAL RETROGRADE PYELOGRAM,BILATERAL URETERAL STENT PLACEMENT;  Surgeon: Sebastian Ache, MD;  Location: [facility];  Service: Urology;  Laterality: Bilateral;   History reviewed. No pertinent family history. History  Substance Use Topics  . Smoking status: Former Games developer  . Smokeless tobacco: Not on file  . Alcohol Use: No    Review of Systems  All other systems reviewed and are negative.     Allergies  Review of patient's allergies indicates no known allergies.  Home Medications   Prior to Admission medications   Medication Sig Start Date End Date Taking? Authorizing Provider  levothyroxine (SYNTHROID, LEVOTHROID) 100 MCG tablet Take 100 mcg by mouth daily. 09/27/14   Historical Provider, MD  pravastatin (PRAVACHOL) 40 MG tablet Take 40 mg by mouth daily. 10/20/14   Historical Provider, MD  senna-docusate (SENOKOT-S) 8.6-50 MG per tablet Take 1 tablet  by mouth 2 (two) times daily. While taking pain meds to prevent constipation 11/03/14   Sebastian Ache, MD  sulfamethoxazole-trimethoprim (BACTRIM DS,SEPTRA DS) 800-160 MG per tablet Take 1 tablet by mouth 2 (two) times daily. X 5 days 11/03/14   Sebastian Ache, MD  traMADol (ULTRAM) 50 MG tablet Take 1 tablet (50 mg total) by mouth every 6 (six) hours as needed for moderate pain. 11/03/14   Sebastian Ache, MD   BP 105/62 mmHg  Pulse 95  Temp(Src) 97.8 F (36.6 C) (Oral)  Resp 16  Ht  (1.727 m)  Wt 132 lb (59.875 kg)  BMI 20.08 kg/m2  SpO2 99% Physical Exam  Constitutional: No distress.  HENT:  Head: Normocephalic and atraumatic.  Right Ear: External ear normal.  Left Ear: External ear normal.  Eyes: Conjunctivae are normal. Right eye exhibits no discharge. Left eye exhibits no discharge. No scleral icterus.  Neck: Neck supple. No tracheal deviation present.  Cardiovascular: Normal rate, regular rhythm and intact distal pulses.   Pulmonary/Chest: Effort normal and breath sounds normal. No stridor. No respiratory distress. He has no wheezes. He has no rales.  Abdominal: Soft. Bowel sounds are normal. He exhibits distension. There is tenderness. There is no rebound and no guarding.    Large mass in the lower abdomen consistent with a distended bladder  Genitourinary: Penis normal.  Musculoskeletal: He exhibits no edema or tenderness.  Neurological: He is alert. He has normal strength. No cranial nerve deficit (no facial droop, extraocular movements intact, no slurred speech) or sensory deficit.  He exhibits normal muscle tone. He displays no seizure activity. Coordination normal.  Skin: Skin is warm and dry. No rash noted.  Psychiatric: He has a normal mood and affect.  Nursing note and vitals reviewed.   ED Course  Procedures (including critical care time)  Foley catheter was placed by nursing. There was no spontaneous drainage other than a small amount maybe 5 mL of  bloody-looking urine. I gently flushed the bladder with 10 mL aliquots.  There was a large amount of spontaneous drainage greater than 500 mL and it continues to drain. Labs Review Labs Reviewed  CBC WITH DIFFERENTIAL/PLATELET - Abnormal; Notable for the following:    RBC 2.77 (*)    Hemoglobin 7.0 (*)    HCT 23.0 (*)    MCH 25.3 (*)    Neutrophils Relative % 87 (*)    Lymphocytes Relative 9 (*)    Lymphs Abs 0.4 (*)    Monocytes Relative 2 (*)    All other components within normal limits  BASIC METABOLIC PANEL - Abnormal; Notable for the following:    Glucose, Bld 194 (*)    BUN 54 (*)    Creatinine, Ser 1.47 (*)    Calcium 8.3 (*)    GFR calc non Af Amer 44 (*)    GFR calc Af Amer 51 (*)    All other components within normal limits  URINALYSIS, ROUTINE W REFLEX MICROSCOPIC - Abnormal; Notable for the following:    Color, Urine RED (*)    APPearance TURBID (*)    Hgb urine dipstick LARGE (*)    Bilirubin Urine MODERATE (*)    Protein, ur >300 (*)    Leukocytes, UA LARGE (*)    All other components within normal limits  URINE CULTURE  URINE MICROSCOPIC-ADD ON   Medications  ondansetron (ZOFRAN) injection 4 mg (4 mg Intravenous Given 12/02/14 2052)    MDM   Final diagnoses:  Urinary retention  Anemia, unspecified anemia type  Renal insufficiency   The patient had large volume urinary retention. The patient continues to have significant hematuria. Seems to have his attempted to irrigate the bladder but the patient continues to have clots and gross hematuria without any clearing. The patient had a similar episode last month. He did have a bladder biopsy because of concerns for bladder carcinoma. 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 The patient had been doing well till this episode started this week.  Plan on consultation with urology because of his persistent hematuria and issues with the catheter continuing to become  obstructed.   Still waiting for urology 1123 pm.  Pt has been irrigated more and now his urine clear and the catheter is draining clear fluid.  Discussed case with Dr Berneice Heinrichmanny.  Ok for pt to go home.  Follow up as an outpatient.  Discussed findings with family.  Will ask nursing staff to review how to irrigate the catheter at home.  Linwood DibblesJon Jama Mcmiller, MD 12/02/14 [phone]

## 2014-12-02 NOTE — Discharge Instructions (Signed)
Acute Urinary Retention °Acute urinary retention is the temporary inability to urinate. °This is a common problem in older men. As men age their prostates become larger and block the flow of urine from the bladder. This is usually a problem that has come on gradually.  °HOME CARE INSTRUCTIONS °If you are sent home with a Foley catheter and a drainage system, you will need to discuss the best course of action with your health care provider. While the catheter is in, maintain a good intake of fluids. Keep the drainage bag emptied and lower than your catheter. This is so that contaminated urine will not flow back into your bladder, which could lead to a urinary tract infection. °There are two main types of drainage bags. One is a large bag that usually is used at night. It has a good capacity that will allow you to sleep through the night without having to empty it. The second type is called a leg bag. It has a smaller capacity, so it needs to be emptied more frequently. However, the main advantage is that it can be attached by a leg strap and can go underneath your clothing, allowing you the freedom to move about or leave your home. °Only take over-the-counter or prescription medicines for pain, discomfort, or fever as directed by your health care provider.  °SEEK MEDICAL CARE IF: °· You develop a low-grade fever. °· You experience spasms or leakage of urine with the spasms. °SEEK IMMEDIATE MEDICAL CARE IF:  °· You develop chills or fever. °· Your catheter stops draining urine. °· Your catheter falls out. °· You start to develop increased bleeding that does not respond to rest and increased fluid intake. °MAKE SURE YOU: °· Understand these instructions. °· Will watch your condition. °· Will get help right away if you are not doing well or get worse. °Document Released: 11/10/2000 Document Revised: 08/09/2013 Document Reviewed: 01/13/2013 °Exit[employer]® Patient Information ©2015 [employer]. This information is not  intended to replace advice given to you by your health care provider. Make sure you discuss any questions you have with your health care provider. ° °

## 2014-12-02 NOTE — ED Notes (Signed)
Pt arrived to the ED with a complaint of urinary retention.  Pt has recently had bladder surgery for blood clots in the bladder.  Pt states he has the urge to urinate but nothing comes out.  Pt states the last time he urinated was last night

## 2014-12-02 NOTE — ED Notes (Addendum)
Delay on lab draw,  Nurse still trying to insert fole;y cath.

## 2014-12-02 NOTE — ED Notes (Signed)
Delay on lab draw,  Pt getting foley catherer.

## 2014-12-03 DIAGNOSIS — R339 Retention of urine, unspecified: Secondary | ICD-10-CM | POA: Diagnosis not present

## 2014-12-04 LAB — URINE CULTURE
Colony Count: NO GROWTH
Culture: NO GROWTH

## 2015-01-04 ENCOUNTER — Emergency Department (HOSPITAL_COMMUNITY)
Admission: EM | Admit: 2015-01-04 | Discharge: 2015-01-04 | Disposition: A | Discharge status: Home / Self Care | Payer: Medicare Other | Attending: Emergency Medicine | Admitting: Emergency Medicine

## 2015-01-04 ENCOUNTER — Encounter (HOSPITAL_COMMUNITY): Payer: Self-pay | Admitting: *Deleted

## 2015-01-04 DIAGNOSIS — R112 Nausea with vomiting, unspecified: Secondary | ICD-10-CM | POA: Insufficient documentation

## 2015-01-04 DIAGNOSIS — Z792 Long term (current) use of antibiotics: Secondary | ICD-10-CM | POA: Diagnosis not present

## 2015-01-04 DIAGNOSIS — J029 Acute pharyngitis, unspecified: Secondary | ICD-10-CM | POA: Diagnosis not present

## 2015-01-04 DIAGNOSIS — R109 Unspecified abdominal pain: Secondary | ICD-10-CM | POA: Diagnosis present

## 2015-01-04 DIAGNOSIS — Z79899 Other long term (current) drug therapy: Secondary | ICD-10-CM | POA: Insufficient documentation

## 2015-01-04 DIAGNOSIS — E780 Pure hypercholesterolemia: Secondary | ICD-10-CM | POA: Insufficient documentation

## 2015-01-04 DIAGNOSIS — N390 Urinary tract infection, site not specified: Secondary | ICD-10-CM | POA: Insufficient documentation

## 2015-01-04 DIAGNOSIS — R110 Nausea: Secondary | ICD-10-CM

## 2015-01-04 DIAGNOSIS — E079 Disorder of thyroid, unspecified: Secondary | ICD-10-CM | POA: Insufficient documentation

## 2015-01-04 DIAGNOSIS — R319 Hematuria, unspecified: Secondary | ICD-10-CM

## 2015-01-04 DIAGNOSIS — K5900 Constipation, unspecified: Secondary | ICD-10-CM | POA: Diagnosis not present

## 2015-01-04 DIAGNOSIS — K029 Dental caries, unspecified: Secondary | ICD-10-CM | POA: Insufficient documentation

## 2015-01-04 LAB — URINALYSIS, ROUTINE W REFLEX MICROSCOPIC
Bilirubin Urine: NEGATIVE
Glucose, UA: NEGATIVE mg/dL
Ketones, ur: NEGATIVE mg/dL
NITRITE: POSITIVE — AB
Protein, ur: 30 mg/dL — AB
Specific Gravity, Urine: 1.02 (ref 1.005–1.030)
UROBILINOGEN UA: 0.2 mg/dL (ref 0.0–1.0)
pH: 5.5 (ref 5.0–8.0)

## 2015-01-04 LAB — COMPREHENSIVE METABOLIC PANEL
ALBUMIN: 2.8 g/dL — AB (ref 3.5–5.0)
ALT: 21 U/L (ref 17–63)
AST: 20 U/L (ref 15–41)
Alkaline Phosphatase: 74 U/L (ref 38–126)
Anion gap: 10 (ref 5–15)
BUN: 23 mg/dL — ABNORMAL HIGH (ref 6–20)
CO2: 25 mmol/L (ref 22–32)
CREATININE: 1.13 mg/dL (ref 0.61–1.24)
Calcium: 8.1 mg/dL — ABNORMAL LOW (ref 8.9–10.3)
Chloride: 99 mmol/L — ABNORMAL LOW (ref 101–111)
GFR calc Af Amer: >60 mL/min (ref >60)
GFR calc non Af Amer: 60 mL/min — ABNORMAL LOW (ref >60)
Glucose, Bld: 114 mg/dL — ABNORMAL HIGH (ref 65–99)
Potassium: 3.7 mmol/L (ref 3.5–5.1)
Sodium: 134 mmol/L — ABNORMAL LOW (ref 135–145)
Total Bilirubin: 0.5 mg/dL (ref 0.3–1.2)
Total Protein: 6.8 g/dL (ref 6.5–8.1)

## 2015-01-04 LAB — CBC WITH DIFFERENTIAL/PLATELET
BASOS ABS: 0 10*3/uL (ref 0.0–0.1)
Basophils Relative: 0 % (ref 0–1)
Eosinophils Absolute: 0 10*3/uL (ref 0.0–0.7)
Eosinophils Relative: 0 % (ref 0–5)
HCT: 24.4 % — ABNORMAL LOW (ref 39.0–52.0)
Hemoglobin: 7.3 g/dL — ABNORMAL LOW (ref 13.0–17.0)
LYMPHS PCT: 8 % — AB (ref 12–46)
Lymphs Abs: 1 10*3/uL (ref 0.7–4.0)
MCH: 21.4 pg — ABNORMAL LOW (ref 26.0–34.0)
MCHC: 29.9 g/dL — AB (ref 30.0–36.0)
MCV: 71.6 fL — ABNORMAL LOW (ref 78.0–100.0)
Monocytes Absolute: 1.1 10*3/uL — ABNORMAL HIGH (ref 0.1–1.0)
Monocytes Relative: 9 % (ref 3–12)
NEUTROS ABS: 10.2 10*3/uL — AB (ref 1.7–7.7)
NEUTROS PCT: 83 % — AB (ref 43–77)
Platelets: 641 10*3/uL — ABNORMAL HIGH (ref 150–400)
RBC: 3.41 MIL/uL — ABNORMAL LOW (ref 4.22–5.81)
RDW: 17.9 % — AB (ref 11.5–15.5)
WBC: 12.4 10*3/uL — AB (ref 4.0–10.5)

## 2015-01-04 LAB — URINE MICROSCOPIC-ADD ON

## 2015-01-04 MED ORDER — ACETAMINOPHEN 500 MG PO TABS
1000.0000 mg | ORAL_TABLET | Freq: Once | ORAL | Status: AC
  Administered 2015-01-04: 1000 mg via ORAL
  Filled 2015-01-04: qty 2

## 2015-01-04 MED ORDER — SODIUM CHLORIDE 0.9 % IV BOLUS (SEPSIS)
1000.0000 mL | Freq: Once | INTRAVENOUS | Status: AC
  Administered 2015-01-04: 1000 mL via INTRAVENOUS

## 2015-01-04 MED ORDER — SODIUM CHLORIDE 0.9 % IV SOLN
1000.0000 mL | INTRAVENOUS | Status: DC
  Administered 2015-01-04: 1000 mL via INTRAVENOUS

## 2015-01-04 MED ORDER — CEPHALEXIN 500 MG PO CAPS: 500.0000 mg | ORAL_CAPSULE | Freq: Four times a day (QID) | ORAL | Status: AC

## 2015-01-04 MED ORDER — ONDANSETRON HCL 4 MG/2ML IJ SOLN
4.0000 mg | Freq: Once | INTRAMUSCULAR | Status: AC
  Administered 2015-01-04: 4 mg via INTRAVENOUS
  Filled 2015-01-04: qty 2

## 2015-01-04 MED ORDER — CEFTRIAXONE SODIUM 1 G IJ SOLR
1.0000 g | Freq: Once | INTRAMUSCULAR | Status: AC
  Administered 2015-01-04: 1 g via INTRAVENOUS
  Filled 2015-01-04: qty 10

## 2015-01-04 MED ORDER — ONDANSETRON HCL 4 MG PO TABS: 4.0000 mg | ORAL_TABLET | Freq: Three times a day (TID) | ORAL | Status: DC | PRN

## 2015-01-04 NOTE — ED Notes (Signed)
abd pain, nausea, no vomiting expectorating sputum that is green.   Had bladder surgery in March.  Diarrhea last pm.

## 2015-01-04 NOTE — ED Provider Notes (Signed)
CSN: [id number]     Arrival date & time 01/04/15  1406 History   First MD Initiated Contact with Patient 01/04/15 1626     Chief Complaint  Patient presents with  . Abdominal Pain     (Consider location/radiation/quality/duration/timing/severity/associated sxs/prior Treatment) HPI [name] is a pleasant 81 year old gentleman with history of recent UTI with bladder biopsy showing inflammation who presents to the ED today for 10 day history of nausea, vomiting, weight loss, mouth sores, alternating constipation and diarrhea, and generalized weakness and dizziness with positional changes.  He was presribed cipro recently and the medication was apparently lost in his home.   His last ER visit was for urinary retention with hematuria and infection, he had a foley placed and irrigated, received and completed a course of Keflex and then he followed up with Dr. Berneice [name] at some point when it was removed.  He denies any urinary symptoms at this time, although he does experience occasional incontinence.  He had one episode of diarrhea last night which he describes as brown and foamy, without any frank blood or melena.  He has had a few episodes of vomiting, which he describes as "choking up food" and ranging from appearing to be chewed food to yellow/green, bitter vomit.  He denies any reflux, abdominal pain or cramping.  States he has a sore throat that was new this morning, but also has sores and pain throughout his throat and gums.  He was previously drinking ensure to help with weight loss, however his wife stopped giving them to him when the mouth pain occurred.  He has not tried any medications to help with the nausea or intermittent diarrhea/constipation.  Other complaints include mouth dryness and fever.   Past Medical History  Diagnosis Date  . Hypercholesteremia   . Thyroid disease    Past Surgical History  Procedure Laterality Date  . Back surgery    . Transurethral resection of bladder tumor  Bilateral 10/31/2014    Procedure: CYSTO, BLADDER BIOPSY/CLOT EVACUATION, , BILATERAL RETROGRADE PYELOGRAM,BILATERAL URETERAL STENT PLACEMENT;  Surgeon: Benjamin Acheheodore Manny, MD;  Location: [facility];  Service: Urology;  Laterality: Bilateral;   History reviewed. No pertinent family history. History  Substance Use Topics  . Smoking status: Never Smoker   . Smokeless tobacco: Not on file  . Alcohol Use: No    Review of Systems  All other systems reviewed and are negative.     Allergies  Review of patient's allergies indicates no known allergies.  Home Medications   Prior to Admission medications   Medication Sig Start Date End Date Taking? Authorizing Provider  cephALEXin (KEFLEX) 500 MG capsule Take 1 capsule (500 mg total) by mouth 3 (three) times daily. 12/02/14   Linwood DibblesJon Knapp, MD  ibuprofen (ADVIL,MOTRIN) 200 MG tablet Take 400 mg by mouth every 6 (six) hours as needed for mild pain or moderate pain.    Historical Provider, MD  levothyroxine (SYNTHROID, LEVOTHROID) 100 MCG tablet Take 100 mcg by mouth daily. 09/27/14   Historical Provider, MD  pravastatin (PRAVACHOL) 40 MG tablet Take 40 mg by mouth daily. 10/20/14   Historical Provider, MD  senna-docusate (SENOKOT-S) 8.6-50 MG per tablet Take 1 tablet by mouth 2 (two) times daily. While taking pain meds to prevent constipation 11/03/14   Benjamin Acheheodore Manny, MD  sulfamethoxazole-trimethoprim (BACTRIM DS,SEPTRA DS) 800-160 MG per tablet Take 1 tablet by mouth 2 (two) times daily. X 5 days Patient not taking: Reported on 01/04/2015 11/03/14   Benjamin Acheheodore Manny,  MD   BP 142/59 mmHg  Pulse 105  Temp(Src) 100.6 F (38.1 C) (Oral)  Resp 17  Ht 5\' 10"  (1.778 m)  Wt 120 lb (54.432 kg)  BMI 17.22 kg/m2  SpO2 100% Physical Exam  Constitutional:  Frail, elderly man, in no acute distress  HENT:  Head: Normocephalic and atraumatic.  Mouth/Throat: Mucous membranes are not pale, not dry and not cyanotic. Abnormal dentition. Dental caries present. Posterior  oropharyngeal erythema present. No tonsillar abscesses.  Diffuse erythema in throat and on gums with white plaques that are tender  Neck: Normal range of motion. Neck supple. No tracheal deviation present. No thyromegaly present.  Pulmonary/Chest: Effort normal and breath sounds normal. No stridor. No respiratory distress. He has no wheezes. He has no rales. He exhibits no tenderness.  Abdominal: Soft. He exhibits no distension and no mass. There is no tenderness. There is no rebound and no guarding.  BS in all 4 quadrants, nontender to palpation and percussion, hypertympanic   Skin: No rash noted. No erythema. No pallor.  Skin difusely dry   Psychiatric: He has a normal mood and affect. His behavior is normal. Judgment and thought content normal.    ED Course  Procedures (including critical care time) Labs Review Labs Reviewed  CBC WITH DIFFERENTIAL/PLATELET - Abnormal; Notable for the following:    WBC 12.4 (*)    RBC 3.41 (*)    Hemoglobin 7.3 (*)    HCT 24.4 (*)    MCV 71.6 (*)    MCH 21.4 (*)    MCHC 29.9 (*)    RDW 17.9 (*)    Platelets 641 (*)    Neutrophils Relative % 83 (*)    Neutro Abs 10.2 (*)    Lymphocytes Relative 8 (*)    Monocytes Absolute 1.1 (*)    All other components within normal limits  COMPREHENSIVE METABOLIC PANEL - Abnormal; Notable for the following:    Sodium 134 (*)    Chloride 99 (*)    Glucose, Bld 114 (*)    BUN 23 (*)    Calcium 8.1 (*)    Albumin 2.8 (*)    GFR calc non Af Amer 60 (*)    All other components within normal limits  URINALYSIS, ROUTINE W REFLEX MICROSCOPIC    Imaging Review No results found.   EKG Interpretation None      MDM   Final diagnoses:  Hematuria  Nausea  UTI (lower urinary tract infection)   Pt initially appeared very dry and complained mostly about nausea and inability to keep weight on.  Basic labs and UA were obtained which revealed recurrent UTI.   Pt received IV fluids and antiemetics and felt  much better.  He is no longer nauseous and vitals are stable.  He will be discharged home with oral zofran and keflex, and has been instructed to follow up closely with Dr. Kathrynn RunningManning to address recurrent UTI.  Return precautions were given and acknowledged.     Danelle BerryLeisa Thu Baggett, PA-C 01/05/15 [id number]  Eber HongBrian Miller, MD 01/05/15 [phone]

## 2015-01-04 NOTE — ED Notes (Signed)
Post ambulation BP 118/67

## 2015-01-04 NOTE — ED Provider Notes (Signed)
The patient is a 81 year old male, he has had a recent history of hematuria which caused a urinary outlet obstruction and hydronephrosis, the Foley catheter was removed 3 weeks ago and he has been voiding spontaneously since that time. Over the last couple of weeks the patient has had approximately a 12 pound weight loss, he has been persistently nauseated, does not want to eat or drink because of the nausea, complains of a sore throat, feels dehydrated weak and lightheaded when he stands up. He denies coughing or shortness of breath, no abdominal pain, he has been voiding spontaneously, has occasional incontinence, denies rashes swelling focal weakness. On exam the patient has a soft nontender abdomen with no masses in the lower abdomen, normal-appearing penis, bilateral lower extremities without swelling, clear heart and lung sounds, no murmurs, no rubs or gallops, clear lungs with no rales wheezing or rhonchi, oropharynx is dry, mucous membranes are dry, there is mild erythema across the pharynx without any exudate asymmetry or hypertrophy, phonation is normal.  Labs show a leukocytosis with a chronic anemia, renal function is essentially preserved at this time. He will need IV fluids, antiemetics, urinalysis, reevaluate. He does not appear acutely ill.  Medical screening examination/treatment/procedure(s) were conducted as a shared visit with non-physician practitioner(s) and myself.  I personally evaluated the patient during the encounter.  Clinical Impression:   Final diagnoses:  Hematuria  Nausea  UTI (lower urinary tract infection)         Eber HongBrian Woods Gangemi, MD 01/05/15 [phone]

## 2015-01-04 NOTE — Discharge Instructions (Signed)
Urinary Tract Infection Urinary tract infections (UTIs) can develop anywhere along your urinary tract. Your urinary tract is your body's drainage system for removing wastes and extra water. Your urinary tract includes two kidneys, two ureters, a bladder, and a urethra. Your kidneys are a pair of bean-shaped organs. Each kidney is about the size of your fist. They are located below your ribs, one on each side of your spine. CAUSES Infections are caused by microbes, which are microscopic organisms, including fungi, viruses, and bacteria. These organisms are so small that they can only be seen through a microscope. Bacteria are the microbes that most commonly cause UTIs. SYMPTOMS  Symptoms of UTIs may vary by age and gender of the patient and by the location of the infection. Symptoms in young women typically include a frequent and intense urge to urinate and a painful, burning feeling in the bladder or urethra during urination. Older women and men are more likely to be tired, shaky, and weak and have muscle aches and abdominal pain. A fever may mean the infection is in your kidneys. Other symptoms of a kidney infection include pain in your back or sides below the ribs, nausea, and vomiting. DIAGNOSIS To diagnose a UTI, your caregiver will ask you about your symptoms. Your caregiver also will ask to provide a urine sample. The urine sample will be tested for bacteria and white blood cells. White blood cells are made by your body to help fight infection. TREATMENT  Typically, UTIs can be treated with medication. Because most UTIs are caused by a bacterial infection, they usually can be treated with the use of antibiotics. The choice of antibiotic and length of treatment depend on your symptoms and the type of bacteria causing your infection. HOME CARE INSTRUCTIONS  If you were prescribed antibiotics, take them exactly as your caregiver instructs you. Finish the medication even if you feel better after  you have only taken some of the medication.  Drink enough water and fluids to keep your urine clear or pale yellow.  Avoid caffeine, tea, and carbonated beverages. They tend to irritate your bladder.  Empty your bladder often. Avoid holding urine for long periods of time.  Empty your bladder before and after sexual intercourse.  After a bowel movement, women should cleanse from front to back. Use each tissue only once. SEEK MEDICAL CARE IF:   You have back pain.  You develop a fever.  Your symptoms do not begin to resolve within 3 days. SEEK IMMEDIATE MEDICAL CARE IF:   You have severe back pain or lower abdominal pain.  You develop chills.  You have nausea or vomiting.  You have continued burning or discomfort with urination. MAKE SURE YOU:   Understand these instructions.  Will watch your condition.  Will get help right away if you are not doing well or get worse. Document Released: 05/14/2005 Document Revised: 02/03/2012 Document Reviewed: 09/12/2011 [facility] Patient Information 2015 [place], [place]. This information is not intended to replace advice given to you by your health care provider. Make sure you discuss any questions you have with your health care provider.  Nausea, Adult Nausea is the feeling that you have an upset stomach or have to vomit. Nausea by itself is not likely a serious concern, but it may be an early sign of more serious medical problems. As nausea gets worse, it can lead to vomiting. If vomiting develops, there is the risk of dehydration.  CAUSES   Viral infections.  Food poisoning.  Medicines.  Pregnancy.  Motion sickness.  Migraine headaches.  Emotional distress.  Severe pain from any source.  Alcohol intoxication. HOME CARE INSTRUCTIONS  Get plenty of rest.  Ask your caregiver about specific rehydration instructions.  Eat small amounts of food and sip liquids more often.  Take all medicines as told by your  caregiver. SEEK MEDICAL CARE IF:  You have not improved after 2 days, or you get worse.  You have a headache. SEEK IMMEDIATE MEDICAL CARE IF:   You have a fever.  You faint.  You keep vomiting or have blood in your vomit.  You are extremely weak or dehydrated.  You have dark or bloody stools.  You have severe chest or abdominal pain. MAKE SURE YOU:  Understand these instructions.  Will watch your condition.  Will get help right away if you are not doing well or get worse. Document Released: 09/11/2004 Document Revised: 04/28/2012 Document Reviewed: 04/16/2011 [facility] Patient Information 2015 [place]. This information is not intended to replace advice given to you by your health care provider. Make sure you discuss any questions you have with your health care provider.    Malnutrition  Many of us think of malnutrition as a condition in which there is not enough to eat. Malnutrition is actually any condition where nutrition is poor. This means:  Too much to eat as we see in conditions of obesity.  Too little to eat with starvation. The following information is only for the malnourished with dietary deficiencies (poor diet).  CAUSES  Under-nutrition can result from:  Poor intake.  Malabsorption  Lactation  Bleeding  Diarrhea  Old age.  Kidney failure.  Infancy  Poverty  Infection  Adolescence  Excessive sweating  Drug addiction  Pregnancy  Early childhood. Under-nutrition comes anytime the demand is more than the intake.  SYMPTOMS  The problems depend on what type of malnutrition is present. Some general symptoms include:  Fatigue.  Dizziness.  Fainting  Weight loss.  Poor immune response.  Lack of menstruation.  Lack of growth in children.  Hair loss. DIAGNOSIS  Your caregiver will usually suspect malnutrition based on results of your:  Medical and dietary history.  Physical exam. This will often include measurements of your BMI (body mass  index).  Perhaps some blood tests. These may include: plasma levels of nutrients and nutrient-dependent substances, such as:  Hemoglobin  Thyroid hormones  Transferrin  Albumin RISK FACTORS  Persons in the following circumstances may be at risk of malnutrition.  Infants and children are at risk of under-nutrition. This is because of their high demand for energy and essential nutrients. Protein-energy malnutrition in children consuming inadequate amounts of protein, calories, and other nutrients is a particularly severe form of under-nutrition that delays growth and development. This includes [place] and [place].  Hemorrhagic disease of the newborn is a life-threatening disorder. This is due to a lack of vitamin K, iron, folic acid, vitamin C, copper, zinc, and vitamin A. This may occur in inadequately fed infants and children.  In adolescence, nutritional requirements increase because they are growing. Anorexia nervosa, a form of starvation, may affect adolescents.  Pregnancy and lactation. Requirements for all nutrients are increased during pregnancy and lactation.  Abnormal diets, such as pica (the consumption of nonnutritive substances, such as clay and charcoal), are common in pregnancy.  Anemia due to folic acid deficiency is common in pregnant women. This is especially true for those who have taken oral contraceptives. Folic acid supplements are now  recommended for pregnant women. Folic acid prevents neural tube defects (spina bifida) in children.  Breast-fed-only infants may develop vitamin B12 deficiency if the mother is a vegan.  An alcoholic mother may have a handicapped and stunted child with fetal alcohol syndrome. This is due to the effects of alcohol on the fetus. Do not drink during pregnancy.  Old age: A weakened sense of taste and smell, loneliness, physical and mental handicaps, immobility, and chronic illness can hurt the food intake in the elderly. Absorption is reduced. This  may add to iron deficiency, calcium and bone problems and also a softening of the bones due to lack of vitamin D. This is also made worse by not being in the sun.  With aging, we loose lean body mass. These changes and a reduction in physical activity result in lower energy and protein requirements compared with those of younger adults.  Chronic disease including malabsorption states (including those resulting from surgery) tend to impair the absorption of fat-soluble vitamins, vitamin B12, calcium, and iron.  Liver disease impairs the storage of vitamins A and B12. It also interferes with the metabolism of protein and energy sources.  Kidney disease may cause deficiencies of protein, iron, and vitamin D.  Cancer and AIDS may cause anorexia. This is a loss of appetite.  Vegetarian diet. The most common form of this type of diet is when meat and fish are not eaten, but eggs and dairy products are eaten. Iron deficiency is the only risk. Ovo-lacto vegetarians tend to live longer and to develop fewer chronic disabling conditions than their meat-eating peers. However, their lifestyle usually includes regular exercise and abstention from alcohol and tobacco. This may contribute to better health. Vegans consume no animal products and are susceptible to vitamin B12 deficiency. Yeast extracts and oriental-style fermented foods provide this vitamin. Intake of calcium, iron, and zinc also tends to be low. A fruitarian diet (eat only fruit) is deficient in protein, salt, and many micronutrients. This is not recommended.  Fad diets: Many commercial diets are claimed to enhance well-being or reduce weight. A physician should be alert to early evidence of nutrient deficiency or toxicity in patients on these diets. Such diets have resulted in vitamin, mineral, and protein deficiency states and cardiac, renal, and metabolic disorders. Some fad diets have resulted in death. People on very low calorie diets (less than 400  kcal/day) cannot sustain health for long. Some trace mineral supplements have induced toxicity.  Alcohol or drug dependency: Addiction leads to a troubled lifestyle in which adequate nourishment is ignored. Absorption and metabolism of nutrients are impaired. High levels of alcohol are poisonous. Too much alcohol can cause tissue injury, particularly of the GI tract, liver, pancreas, brain, and peripheral nervous system. Beer drinkers who consume food may gain weight, but alcoholics who use more than one quart of hard liquor per day lose weight and become undernourished. Drug addicts are usually very skinny. Alcoholism is the most common cause of thiamine deficiency and may lead to deficiencies of magnesium, zinc, and other vitamins. TREATMENT  Get treatment if you experience changes in how your body is working.  PREVENTION  Eating a good, well-balanced diet helps to prevent most forms of malnutrition.  Document Released: 06/20/2005 Document Revised: 10/27/2011 Document Reviewed: 07/12/2007  [facility] Patient Information 2015 [place], [place]. This information is not intended to replace advice given to you by your health care provider. Make sure you discuss any questions you have with your health care provider.  Antibiotic Medication Antibiotics are among the most frequently prescribed medicines. Antibiotics cure illness by assisting our body to injure or kill the bacteria that cause infection. While antibiotics are useful to treat a wide variety of infections they are useless against viruses. Antibiotics cannot cure colds, flu, or other viral infections.  There are many types of antibiotics available. Your caregiver will decide which antibiotic will be useful for an illness. Never take or give someone else's antibiotics or left over medicine. Your caregiver may also take into account:  Allergies.  The cost of the medicine.  Dosing schedules.  Taste.  Common side effects when choosing an  antibiotic for an infection. Ask your caregiver if you have questions about why a certain medicine was chosen. HOME CARE INSTRUCTIONS Read all instructions and labels on medicine bottles carefully. Some antibiotics should be taken on an empty stomach while others should be taken with food. Taking antibiotics incorrectly may reduce how well they work. Some antibiotics need to be kept in the refrigerator. Others should be kept at room temperature. Ask your caregiver or pharmacist if you do not understand how to give the medicine. Be sure to give the amount of medicine your caregiver has prescribed. Even if you feel better and your symptoms improve, bacteria may still remain alive in the body. Taking all of the medicine will prevent:  The infection from returning and becoming harder to treat.  Complications from partially treated infections. If there is any medicine left over after you have taken the medicine as your caregiver has instructed, throw the medicine away. Be sure to tell your caregiver if you:  Are allergic to any medicines.  Are pregnant or intend to become pregnant while using this medicine.  Are breastfeeding.  Are taking any other prescription, non-prescription medicine, or herbal remedies.  Have any other medical conditions or problems you have not already discussed. If you are taking birth control pills, they may not work while you are on antibiotics. To avoid unwanted pregnancy:  Continue taking your birth control pills as usual.  Use a second form of birth control (such as condoms) while you are taking antibiotic medicine.  When you finish taking the antibiotic medicine, continue using the second form of birth control until you are finished with your current 1 month cycle of birth control pills. Try not to miss any doses of medicine. If you miss a dose, take it as soon as possible. However, if it is almost time for the next dose and the dosing schedule is:  2 doses a  day, take the missed dose and the next dose 5 to 6 hours apart.  3 or more doses a day, take the missed dose and the next dose 2 to 4 hours apart, then go back to the normal schedule.  If you are unable to make up a missed dose, take the next scheduled dose on time and complete the missed dose at the end of the prescribed time for your medicine. SIDE EFFECTS TO TAKING ANTIBIOTICS Common side effects to antibiotic use include:  Soft stools or diarrhea.  Mild stomach upset.  Sun sensitivity. SEEK MEDICAL CARE IF:   If you get worse or do not improve within a few days of starting the medicine.  Vomiting develops.  Diaper rash or rash on the genitals appears.  Vaginal itching occurs.  White patches appear on the tongue or in the mouth.  Severe watery diarrhea and abdominal cramps occur.  Signs of an allergy develop (hives,  unknown itchy rash appears). STOP TAKING THE ANTIBIOTIC. SEEK IMMEDIATE MEDICAL CARE IF:   Urine turns dark or blood colored.  Skin turns yellow.  Easy bruising or bleeding occurs.  Joint pain or muscle aches occur.  Fever returns.  Severe headache occurs.  Signs of an allergy develop (trouble breathing, wheezing, swelling of the lips, face or tongue, fainting, or blisters on the skin or in the mouth). STOP TAKING THE ANTIBIOTIC. Document Released: 04/16/2004 Document Revised: 10/27/2011 Document Reviewed: 04/26/2009 [facility] Patient Information 2015 [place], [place]. This information is not intended to replace advice given to you by your health care provider. Make sure you discuss any questions you have with your health care provider.

## 2016-11-10 ENCOUNTER — Inpatient Hospital Stay (HOSPITAL_COMMUNITY)
Admission: EM | Admit: 2016-11-10 | Discharge: 2016-11-13 | DRG: 690 | Disposition: A | Discharge status: Home / Self Care | Payer: Medicare Other | Attending: Family Medicine | Admitting: Family Medicine

## 2016-11-10 ENCOUNTER — Encounter (HOSPITAL_COMMUNITY): Payer: Self-pay | Admitting: Emergency Medicine

## 2016-11-10 ENCOUNTER — Emergency Department (HOSPITAL_COMMUNITY): Payer: Medicare Other

## 2016-11-10 DIAGNOSIS — B379 Candidiasis, unspecified: Secondary | ICD-10-CM | POA: Diagnosis present

## 2016-11-10 DIAGNOSIS — K219 Gastro-esophageal reflux disease without esophagitis: Secondary | ICD-10-CM

## 2016-11-10 DIAGNOSIS — R933 Abnormal findings on diagnostic imaging of other parts of digestive tract: Secondary | ICD-10-CM

## 2016-11-10 DIAGNOSIS — N390 Urinary tract infection, site not specified: Secondary | ICD-10-CM | POA: Diagnosis not present

## 2016-11-10 DIAGNOSIS — K5641 Fecal impaction: Secondary | ICD-10-CM | POA: Diagnosis not present

## 2016-11-10 DIAGNOSIS — E030 Congenital hypothyroidism with diffuse goiter: Secondary | ICD-10-CM

## 2016-11-10 DIAGNOSIS — N179 Acute kidney failure, unspecified: Secondary | ICD-10-CM | POA: Diagnosis not present

## 2016-11-10 DIAGNOSIS — K5900 Constipation, unspecified: Secondary | ICD-10-CM | POA: Diagnosis present

## 2016-11-10 DIAGNOSIS — N021 Recurrent and persistent hematuria with focal and segmental glomerular lesions: Secondary | ICD-10-CM

## 2016-11-10 DIAGNOSIS — E039 Hypothyroidism, unspecified: Secondary | ICD-10-CM | POA: Diagnosis not present

## 2016-11-10 DIAGNOSIS — R159 Full incontinence of feces: Secondary | ICD-10-CM | POA: Diagnosis present

## 2016-11-10 DIAGNOSIS — Z9889 Other specified postprocedural states: Secondary | ICD-10-CM

## 2016-11-10 DIAGNOSIS — R310 Gross hematuria: Secondary | ICD-10-CM | POA: Diagnosis present

## 2016-11-10 DIAGNOSIS — Z79899 Other long term (current) drug therapy: Secondary | ICD-10-CM

## 2016-11-10 DIAGNOSIS — R339 Retention of urine, unspecified: Secondary | ICD-10-CM | POA: Diagnosis not present

## 2016-11-10 DIAGNOSIS — M5137 Other intervertebral disc degeneration, lumbosacral region: Secondary | ICD-10-CM

## 2016-11-10 DIAGNOSIS — K529 Noninfective gastroenteritis and colitis, unspecified: Secondary | ICD-10-CM

## 2016-11-10 DIAGNOSIS — B370 Candidal stomatitis: Secondary | ICD-10-CM | POA: Diagnosis present

## 2016-11-10 DIAGNOSIS — K5909 Other constipation: Secondary | ICD-10-CM | POA: Diagnosis present

## 2016-11-10 DIAGNOSIS — K5901 Slow transit constipation: Secondary | ICD-10-CM

## 2016-11-10 DIAGNOSIS — R32 Unspecified urinary incontinence: Secondary | ICD-10-CM | POA: Diagnosis present

## 2016-11-10 HISTORY — DX: Retention of urine, unspecified: R33.9

## 2016-11-10 LAB — CBC WITH DIFFERENTIAL/PLATELET
BASOS PCT: 0 %
Basophils Absolute: 0 10*3/uL (ref 0.0–0.1)
Eosinophils Absolute: 0.3 10*3/uL (ref 0.0–0.7)
Eosinophils Relative: 3 %
HEMATOCRIT: 47.7 % (ref 39.0–52.0)
HEMOGLOBIN: 16 g/dL (ref 13.0–17.0)
LYMPHS ABS: 2.3 10*3/uL (ref 0.7–4.0)
LYMPHS PCT: 23 %
MCH: 30.1 pg (ref 26.0–34.0)
MCHC: 33.5 g/dL (ref 30.0–36.0)
MCV: 89.7 fL (ref 78.0–100.0)
MONOS PCT: 11 %
Monocytes Absolute: 1.1 10*3/uL — ABNORMAL HIGH (ref 0.1–1.0)
NEUTROS ABS: 6.3 10*3/uL (ref 1.7–7.7)
Neutrophils Relative %: 63 %
Platelets: 312 10*3/uL (ref 150–400)
RBC: 5.32 MIL/uL (ref 4.22–5.81)
RDW: 14.1 % (ref 11.5–15.5)
WBC: 10.1 10*3/uL (ref 4.0–10.5)

## 2016-11-10 LAB — URINALYSIS, ROUTINE W REFLEX MICROSCOPIC
BILIRUBIN URINE: NEGATIVE
GLUCOSE, UA: NEGATIVE mg/dL
Ketones, ur: 5 mg/dL — AB
NITRITE: NEGATIVE
PH: 6 (ref 5.0–8.0)
Protein, ur: 30 mg/dL — AB
SPECIFIC GRAVITY, URINE: 1.015 (ref 1.005–1.030)

## 2016-11-10 LAB — BASIC METABOLIC PANEL
Anion gap: 10 (ref 5–15)
BUN: 26 mg/dL — ABNORMAL HIGH (ref 6–20)
CO2: 26 mmol/L (ref 22–32)
Calcium: 9.2 mg/dL (ref 8.9–10.3)
Chloride: 98 mmol/L — ABNORMAL LOW (ref 101–111)
Creatinine, Ser: 1.54 mg/dL — ABNORMAL HIGH (ref 0.61–1.24)
GFR calc non Af Amer: 41 mL/min — ABNORMAL LOW (ref >60)
GFR, EST AFRICAN AMERICAN: 47 mL/min — AB (ref >60)
Glucose, Bld: 99 mg/dL (ref 65–99)
POTASSIUM: 4 mmol/L (ref 3.5–5.1)
Sodium: 134 mmol/L — ABNORMAL LOW (ref 135–145)

## 2016-11-10 LAB — POC OCCULT BLOOD, ED: Fecal Occult Bld: NEGATIVE

## 2016-11-10 MED ORDER — CIPROFLOXACIN IN D5W 400 MG/200ML IV SOLN
400.0000 mg | Freq: Once | INTRAVENOUS | Status: AC
  Administered 2016-11-10: 400 mg via INTRAVENOUS
  Filled 2016-11-10: qty 200

## 2016-11-10 MED ORDER — DOCUSATE SODIUM 100 MG PO CAPS
100.0000 mg | ORAL_CAPSULE | Freq: Two times a day (BID) | ORAL | Status: DC
  Administered 2016-11-10 – 2016-11-13 (×5): 100 mg via ORAL
  Filled 2016-11-10 (×5): qty 1

## 2016-11-10 MED ORDER — DEXTROSE 5 % IV SOLN
INTRAVENOUS | Status: AC
  Filled 2016-11-10: qty 10

## 2016-11-10 MED ORDER — ONDANSETRON HCL 4 MG PO TABS: 4.0000 mg | ORAL_TABLET | Freq: Four times a day (QID) | ORAL | Status: DC | PRN

## 2016-11-10 MED ORDER — MAGNESIUM CITRATE PO SOLN
1.0000 | Freq: Once | ORAL | Status: AC | PRN
  Administered 2016-11-11: 1 via ORAL
  Filled 2016-11-10 (×2): qty 296

## 2016-11-10 MED ORDER — DEXTROSE 5 % IV SOLN
1.0000 g | INTRAVENOUS | Status: DC
  Administered 2016-11-10 – 2016-11-12 (×3): 1 g via INTRAVENOUS
  Filled 2016-11-10 (×4): qty 10

## 2016-11-10 MED ORDER — PRAVASTATIN SODIUM 40 MG PO TABS
40.0000 mg | ORAL_TABLET | Freq: Every day | ORAL | Status: DC
  Administered 2016-11-10 – 2016-11-12 (×3): 40 mg via ORAL
  Filled 2016-11-10 (×3): qty 1

## 2016-11-10 MED ORDER — POLYETHYLENE GLYCOL 3350 17 G PO PACK
17.0000 g | PACK | Freq: Every day | ORAL | Status: DC | PRN
  Administered 2016-11-11: 17 g via ORAL
  Filled 2016-11-10: qty 1

## 2016-11-10 MED ORDER — ONDANSETRON HCL 4 MG/2ML IJ SOLN: 4.0000 mg | Freq: Four times a day (QID) | INTRAMUSCULAR | Status: DC | PRN

## 2016-11-10 MED ORDER — METRONIDAZOLE IN NACL 5-0.79 MG/ML-% IV SOLN
500.0000 mg | Freq: Once | INTRAVENOUS | Status: AC
  Administered 2016-11-10: 500 mg via INTRAVENOUS
  Filled 2016-11-10: qty 100

## 2016-11-10 MED ORDER — LEVOTHYROXINE SODIUM 100 MCG PO TABS
100.0000 ug | ORAL_TABLET | Freq: Every day | ORAL | Status: DC
  Administered 2016-11-11 – 2016-11-13 (×3): 100 ug via ORAL
  Filled 2016-11-10 (×3): qty 1

## 2016-11-10 MED ORDER — BISACODYL 5 MG PO TBEC
5.0000 mg | DELAYED_RELEASE_TABLET | Freq: Every day | ORAL | Status: DC | PRN
  Administered 2016-11-11: 5 mg via ORAL
  Filled 2016-11-10: qty 1

## 2016-11-10 MED ORDER — ENOXAPARIN SODIUM 40 MG/0.4ML ~~LOC~~ SOLN
40.0000 mg | SUBCUTANEOUS | Status: DC
  Administered 2016-11-10 – 2016-11-12 (×3): 40 mg via SUBCUTANEOUS
  Filled 2016-11-10 (×3): qty 0.4

## 2016-11-10 MED ORDER — MAGIC MOUTHWASH
10.0000 mL | Freq: Four times a day (QID) | ORAL | Status: DC
  Administered 2016-11-10 – 2016-11-13 (×9): 10 mL via ORAL
  Filled 2016-11-10 (×8): qty 10

## 2016-11-10 MED ORDER — METRONIDAZOLE IN NACL 5-0.79 MG/ML-% IV SOLN
INTRAVENOUS | Status: AC
  Filled 2016-11-10: qty 100

## 2016-11-10 MED ORDER — ACETAMINOPHEN 325 MG PO TABS
650.0000 mg | ORAL_TABLET | Freq: Four times a day (QID) | ORAL | Status: DC | PRN
  Administered 2016-11-10: 650 mg via ORAL
  Filled 2016-11-10: qty 2

## 2016-11-10 MED ORDER — ACETAMINOPHEN 650 MG RE SUPP
650.0000 mg | Freq: Four times a day (QID) | RECTAL | Status: DC | PRN
Start: 2016-11-10 — End: 2016-11-13

## 2016-11-10 MED ORDER — LACTATED RINGERS IV SOLN
INTRAVENOUS | Status: DC
  Administered 2016-11-10 – 2016-11-13 (×4): via INTRAVENOUS

## 2016-11-10 NOTE — Progress Notes (Signed)
Pharmacy Antibiotic Note  Benjamin [name] is a 83 y.o. male admitted on 11/10/2016 with UTI.  Pharmacy has been consulted for rocephin dosing.  Plan: Rocephin 1 gm IV q24 hours Pharmacy will sign off.  Please advise if we can be of further assistance  Height: 5\' 10"  (177.8 cm) Weight: 128 lb 12.8 oz (58.4 kg) IBW/kg (Calculated) : 73  Temp (24hrs), Avg:97.9 F (36.6 C), Min:97.8 F (36.6 C), Max:98 F (36.7 C)   Recent Labs Lab 11/10/16 0929  WBC 10.1  CREATININE 1.54*    Estimated Creatinine Clearance: 31.1 mL/min (A) (by C-G formula based on SCr of 1.54 mg/dL (H)).    No Known Allergies  Tkank you for allowing pharmacy to be a part of this patient's care.  Benjamin [name], Benjamin [name] 11/10/2016 6:09 PM

## 2016-11-10 NOTE — ED Notes (Signed)
Pt taken to CT.

## 2016-11-10 NOTE — ED Triage Notes (Addendum)
Pt reports constipation x1 week. Pt reports "I feel like I have to go after eating and only do a little bit." pt reports LBM x1 week. Pt reports intermittent black stools and dysuria. Pt denies any abd pain,fever, nausea/vomiting.

## 2016-11-10 NOTE — H&P (Addendum)
History and Physical    Benjamin [name]:[id number] DOB: February 02, 1933 DOA: 11/10/2016  PCP: Isabella Stalling, MD Consultants:  Berneice Heinrich - urology Patient coming from: home - lives with wife; NOK: wife, [phone]  Chief Complaint: constipation  HPI: Benjamin [name] is a 83 y.o. male with medical history significant of hypothyroidism and HLD presenting with constipation.  He reports that he can take a bunch of laxatives without success.  Will feel urgency and only able to get a little bit of black stools (Pepto Bismol about a week ago, heme negative here).  In the ER, he was found to have 1L urine in his bladder.  Slight dysuria, no hematuria, +difficulty starting/stopping stream.  Seems to go more easily when he is out walking.  No fevers.  Decreased PO intake.  As soon as he gets done eating, he has urgency to stool but then isn't able to produce.  Lower abdominal pain.  Also with mild RLQ tenderness several days ago.  Fecal incontinence and urine incontinence over the last week.   ED Course:  1L urinary retention, foley placed with hematuria with sediment noted.  Soft fecal stool ball noted in rectal vault but unable to produce yet.  Placed on Cipro/Flagyl for UTI and possible colitis seen on CT.    Review of Systems: As per HPI; otherwise 10 point review of systems reviewed and negative.   Ambulatory Status:  Has foot drop but generally walks without assistance  Past Medical History:  Diagnosis Date  . Hypercholesteremia   . Thyroid disease   . Urinary retention     Past Surgical History:  Procedure Laterality Date  . BACK SURGERY    . TRANSURETHRAL RESECTION OF BLADDER TUMOR Bilateral 10/31/2014   Procedure: CYSTO, BLADDER BIOPSY/CLOT EVACUATION, , BILATERAL RETROGRADE PYELOGRAM,BILATERAL URETERAL STENT PLACEMENT;  Surgeon: Sebastian Ache, MD;  Location: [facility];  Service: Urology;  Laterality: Bilateral;    Social History   Social History  . Marital status: Married   Spouse name: N/A  . Number of children: N/A  . Years of education: N/A   Occupational History  . retired    Social History Main Topics  . Smoking status: Never Smoker  . Smokeless tobacco: Never Used  . Alcohol use No  . Drug use: No  . Sexual activity: Not on file   Other Topics Concern  . Not on file   Social History Narrative  . No narrative on file    No Known Allergies  History reviewed. No pertinent family history.  Prior to Admission medications   Medication Sig Start Date End Date Taking? Authorizing Provider  levothyroxine (SYNTHROID, LEVOTHROID) 100 MCG tablet Take 100 mcg by mouth daily. 09/27/14  Yes Historical Provider, MD  pravastatin (PRAVACHOL) 40 MG tablet Take 40 mg by mouth daily. 10/20/14  Yes Historical Provider, MD    Physical Exam: Vitals:   11/10/16 1703 11/10/16 1703 11/10/16 1754 11/10/16 2124  BP: 119/67  140/61 (!) 112/58  Pulse: 73  65 69  Resp:   18 18  Temp:  98 F (36.7 C) 97.9 F (36.6 C) 97.7 F (36.5 C)  TempSrc:  Oral Oral Oral  SpO2: 97%  98% 96%  Weight:   58.4 kg (128 lb 12.8 oz)   Height:   5\' 10"  (1.778 m)      General:  Appears calm and comfortable and is NAD Eyes:  PERRL, EOMI, normal lids, iris ENT:  grossly normal hearing, lips & tongue, mmm, very poor  dentition.  There is thrush present in the posterior oropharynx. Neck:  no LAD, masses or thyromegaly Cardiovascular:  RRR, no m/r/g. No LE edema.  Respiratory:  CTA bilaterally, no w/r/r. Normal respiratory effort. Abdomen:  soft, ntnd, NABS, +suprapubic tenderness Skin:  no rash or induration seen on limited exam Musculoskeletal:  grossly normal tone BUE/BLE, good ROM, no bony abnormality Psychiatric:  grossly normal mood and affect, speech fluent and appropriate, AOx3 Neurologic:  CN 2-12 grossly intact, moves all extremities in coordinated fashion, sensation intact  Labs on Admission: I have personally reviewed following labs and imaging studies  CBC:  Recent  Labs Lab 11/10/16 0929  WBC 10.1  NEUTROABS 6.3  HGB 16.0  HCT 47.7  MCV 89.7  PLT 312   Basic Metabolic Panel:  Recent Labs Lab 11/10/16 0929  NA 134*  K 4.0  CL 98*  CO2 26  GLUCOSE 99  BUN 26*  CREATININE 1.54*  CALCIUM 9.2   GFR: Estimated Creatinine Clearance: 31.1 mL/min (A) (by C-G formula based on SCr of 1.54 mg/dL (H)). Liver Function Tests: No results for input(s): AST, ALT, ALKPHOS, BILITOT, PROT, ALBUMIN in the last 168 hours. No results for input(s): LIPASE, AMYLASE in the last 168 hours. No results for input(s): AMMONIA in the last 168 hours. Coagulation Profile: No results for input(s): INR, PROTIME in the last 168 hours. Cardiac Enzymes: No results for input(s): CKTOTAL, CKMB, CKMBINDEX, TROPONINI in the last 168 hours. BNP (last 3 results) No results for input(s): PROBNP in the last 8760 hours. HbA1C: No results for input(s): HGBA1C in the last 72 hours. CBG: No results for input(s): GLUCAP in the last 168 hours. Lipid Profile: No results for input(s): CHOL, HDL, LDLCALC, TRIG, CHOLHDL, LDLDIRECT in the last 72 hours. Thyroid Function Tests: No results for input(s): TSH, T4TOTAL, FREET4, T3FREE, THYROIDAB in the last 72 hours. Anemia Panel: No results for input(s): VITAMINB12, FOLATE, FERRITIN, TIBC, IRON, RETICCTPCT in the last 72 hours. Urine analysis:    Component Value Date/Time   COLORURINE YELLOW 11/10/2016 0929   APPEARANCEUR HAZY (A) 11/10/2016 0929   LABSPEC 1.015 11/10/2016 0929   PHURINE 6.0 11/10/2016 0929   GLUCOSEU NEGATIVE 11/10/2016 0929   HGBUR LARGE (A) 11/10/2016 0929   HGBUR large 07/18/2008 1427   BILIRUBINUR NEGATIVE 11/10/2016 0929   KETONESUR 5 (A) 11/10/2016 0929   PROTEINUR 30 (A) 11/10/2016 0929   UROBILINOGEN 0.2 01/04/2015 1722   NITRITE NEGATIVE 11/10/2016 0929   LEUKOCYTESUR LARGE (A) 11/10/2016 0929    Creatinine Clearance: Estimated Creatinine Clearance: 31.1 mL/min (A) (by C-G formula based on SCr of  1.54 mg/dL (H)).  Sepsis Labs: @LABRCNTIP (procalcitonin:4,lacticidven:4) )No results found for this or any previous visit (from the past 240 hour(s)).   Radiological Exams on Admission: Ct Renal Stone Study  Result Date: 11/10/2016 CLINICAL DATA:  Lower pelvic pain, urinary retention for 2-3 days. EXAM: CT ABDOMEN AND PELVIS WITHOUT CONTRAST TECHNIQUE: Multidetector CT imaging of the abdomen and pelvis was performed following the standard protocol without IV contrast. COMPARISON:  10/31/2014 FINDINGS: Lower chest: Moderate-sized hiatal hernia present. Heart is normal size. Lung bases clear. No effusions. Hepatobiliary: Multiple gallstones within the gallbladder. Mildly prominent common bile duct, measuring up to 10 mm, slightly larger than prior study. No visible distal ureteral stone or intrahepatic biliary duct dilatation. No focal hepatic abnormality. Pancreas: No focal abnormality or ductal dilatation. Spleen: No focal abnormality.  Normal size. Adrenals/Urinary Tract: Small low-density area posteriorly in the midpole of the right kidney, likely  small cyst. This is unchanged. No hydronephrosis. No renal or ureteral stones. Foley catheter is present in the bladder. Bladder wall irregularity noted. Calcifications noted within the bladder lumen or bladder wall as seen on prior CT, not significantly changed. Adrenal glands unremarkable. Stomach/Bowel: Diffuse colonic diverticulosis. Large stool burden throughout the colon. There is mild wall thickening in the rectosigmoid colon with large amounts of stool. Mild haziness noted around the mid sigmoid colon. Abrupt termination of stool and gas in the region of the rectum. Cannot exclude rectal mass. Recommend correlation with digital rectal exam. Small bowel and stomach decompressed, unremarkable. Vascular/Lymphatic: Diffuse aortic and iliac calcifications. No aneurysm or adenopathy. Reproductive: Mild prostate enlargement. Other: No free fluid or free air.  Musculoskeletal: No acute bony abnormality. Degenerative changes in the lumbar spine. IMPRESSION: Large stool burden throughout the entire colon, most pronounced in the rectosigmoid colon. There is abrupt termination of gas and stool at the region of the rectum with air soft tissue fullness. Cannot exclude soft tissue rectal mass. Recommend correlation with digital rectal exam and possible colonoscopy. There is mild wall thickening in the sigmoid colon with surrounding inflammatory changes suggesting colitis. Diffuse colonic diverticulosis. Cholelithiasis. Mildly dilated common bile duct without visible distal stones. Foley catheter in place within the bladder. Bladder wall wall is irregular with possible calcifications, similar to prior study. Moderate-sized hiatal hernia. Aortoiliac atherosclerosis. Electronically Signed   By: Charlett NoseKevin  Dover M.D.   On: 11/10/2016 11:47    EKG: not done  Assessment/Plan Principal Problem:   Constipation Active Problems:   Hypothyroidism   Acute lower UTI   Acute renal failure (ARF) (HCC)   Thrush   Constipation -Patient with presenting c/o constipation -While there may be other causes for it, certainly the 1L+ of urine in his bladder was a likely contributor (see below) -Will start fairly aggressive bowel regimen -While the CT read possible colitis, the patient really does not have pain, fever, diarrhea or other symptoms of colitis -Normal WBC count -For now, will simply try to treat UTI (see below) and constipation and will follow -Received 1 dose of cipro/flagyl in ER, will not continue -There is some concern for a rectal mass on CT; if clean out of constipation and treatment of UTI are unsuccessful, consider GI consult for colonoscopy -He did have an apparently negative c-scope in 2011 -Has h/o hypothyroidism, which can exacerbate constipation; TSH pending  UTI -Patient with urinary symptoms including marked urinary retention -UA: large LE, positive  nitrite, rare bacteria, large Hgb, TNTC RBC and WBC -If he is unable to successfully void, he may need ongoing catheterization -For now will treat with Rocephin -Urine culture is pending  ARF -Creatinine 1.54, prior 1.13 on 01/04/15 -Could be prerenal azotemia associated with poor PO intake and increased metabolic demands from active infection; could also be postrenal from urinary retention -Both aspects are being treated -Will follow without additional intervention at this time  Thrush -While this could simply be from poor PO intake and suboptimal dentition, the presence of thrush does lend more concern to the possible rectal malignancy -Will treat with magic mouthwash and follow   DVT prophylaxis: Lovenox - but monitor hematuria and stop if this is worsening Code Status:  Full - confirmed with patient/family Family Communication: Wife present throughout evaluation  Disposition Plan:  Home once clinically improved Consults called: None  Admission status: It is my clinical opinion that referral for OBSERVATION is reasonable and necessary in this patient based on the above information provided.  The aforementioned taken together are felt to place the patient at high risk for further clinical deterioration. However it is anticipated that the patient may be medically stable for discharge from the hospital within 24 to 48 hours.     Jonah Blue MD [facility]  If 7PM-7AM, please contact night-coverage [email or web address] Password [facility]  11/10/2016, 10:21 PM

## 2016-11-10 NOTE — ED Notes (Signed)
Bladder scan read >999, Dr. Clarene DukeMcManus informed.

## 2016-11-10 NOTE — ED Provider Notes (Signed)
[facility]PT Provider Note   CSN: [id number] Arrival date & time: 11/10/16  0917     History   Chief Complaint Chief Complaint  Patient presents with  . Constipation    HPI Benjamin [name] is a 83 y.o. male.  HPI  Pt was seen at 0945. Per pt and his wife, c/o gradual onset and persistence of constant "constipation" for the past 1 week. Has been associated with "soft black stools," "dribbling urine," dysuria, and lower abd "pain." States he took Pepto Bismol 1 week ago before the black stools started. Denies fevers, no back/flank pain, no CP/SOB, no N/V, no rectal discharge, no rectal bleeding, no hematuria.   Past Medical History:  Diagnosis Date  . Hypercholesteremia   . Thyroid disease   . Urinary retention     Patient Active Problem List   Diagnosis Date Noted  . Hematuria 10/31/2014  . B12 DEFICIENCY 04/16/2009  . 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 DISC DISEASE, LUMBOSACRAL SPINE 12/11/2008  . SEBACEOUS CYST, INFECTED 12/04/2008  . KNEE PAIN, LEFT 11/27/2008  . DUODENITIS 10/02/2008  . UNSPECIFIED HYPOTHYROIDISM 07/25/2008  . CLOSTRIDIUM DIFFICILE COLITIS 07/18/2008  . GERD 07/18/2008  . PEPTIC ULCER DISEASE 07/18/2008  . CONSTIPATION 07/18/2008  . BENIGN PROSTATIC HYPERTROPHY, WITH OBSTRUCTION 07/18/2008  . MALAISE AND FATIGUE 07/18/2008    Past Surgical History:  Procedure Laterality Date  . BACK SURGERY    . 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 Authorizing Provider  levothyroxine (SYNTHROID, LEVOTHROID) 100 MCG tablet Take 100 mcg by mouth daily. 09/27/14  Yes Historical Provider, MD  pravastatin (PRAVACHOL) 40 MG  tablet Take 40 mg by mouth daily. 10/20/14  Yes Historical Provider, MD    Family History History reviewed. No pertinent family history.  Social History Social History  Substance Use Topics  . Smoking status: Never Smoker  . Smokeless tobacco: Never Used  . Alcohol use No     Allergies   Patient has no known allergies.   Review of Systems Review of Systems ROS: Statement: All systems negative except as marked or noted in the HPI; Constitutional: Negative for fever and chills. ; ; Eyes: Negative for eye pain, redness and discharge. ; ; ENMT: Negative for ear pain, hoarseness, nasal congestion, sinus pressure and sore throat. ; ; Cardiovascular: Negative for chest pain, palpitations, diaphoresis, dyspnea and peripheral edema. ; ; Respiratory: Negative for cough, wheezing and stridor. ; ; Gastrointestinal: +abd pain, constipation, "black stools." Negative for nausea, vomiting, diarrhea, blood in stool, hematemesis, jaundice and rectal bleeding. . ; ; Genitourinary: +urinary dribbling, dysuria. Negative for flank pain and hematuria. ; ; Genital:  No penile drainage or rash, no testicular pain or swelling, no scrotal rash or swelling. ;; Musculoskeletal: Negative for back pain and neck pain. Negative for swelling and trauma.; ; Skin: Negative for pruritus, rash, abrasions, blisters, bruising and skin lesion.; ; Neuro: Negative for headache, lightheadedness and neck stiffness. Negative for weakness, altered level of consciousness, altered mental status, extremity weakness, paresthesias, involuntary movement, seizure and syncope.       Physical Exam Updated Vital Signs BP (!) 180/84 (BP Location: Right Arm)   Pulse 93   Temp 97.8 F (36.6 C) (  Oral)   Resp 18   Ht 5\' 10"  (1.778 m)   Wt 130 lb (59 kg)   SpO2 97%   BMI 18.65 kg/m   Physical Exam 0950: Physical examination:  Nursing notes reviewed; Vital signs and O2 SAT reviewed;  Constitutional: Well developed, Well nourished, Well  hydrated, In no acute distress; Head:  Normocephalic, atraumatic; Eyes: EOMI, PERRL, No scleral icterus; ENMT: Mouth and pharynx normal, Mucous membranes moist; Neck: Supple, Full range of motion, No lymphadenopathy; Cardiovascular: Regular rate and rhythm, No gallop; Respiratory: Breath sounds clear & equal bilaterally, No wheezes.  Speaking full sentences with ease, Normal respiratory effort/excursion; Chest: Nontender, Movement normal; Abdomen: Soft, +suprapubic tenderness and distention. No rebound or guarding. Normal bowel sounds. Rectal exam performed w/permission of pt and ED RN chaperone present.  Anal tone normal.  Non-tender, soft black stool in rectal vault, heme neg.  No fissures, no external hemorrhoids, no palp masses. +soft stool ball in rectal vault.; Genitourinary: No CVA tenderness; Extremities: Pulses normal, No tenderness, No edema, No calf edema or asymmetry.; Neuro: AA&Ox3, Major CN grossly intact.  Speech clear. No gross focal motor or sensory deficits in extremities.; Skin: Color normal, Warm, Dry.   ED Treatments / Results  Labs (all labs ordered are listed, but only abnormal results are displayed)   EKG  EKG Interpretation None       Radiology   Procedures Procedures (including critical care time)  Medications Ordered in ED Medications - No data to display   Initial Impression / Assessment and Plan / ED Course  I have reviewed the triage vital signs and the nursing notes.  Pertinent labs & imaging results that were available during my care of the patient were reviewed by me and considered in my medical decision making (see chart for details).  MDM Reviewed: previous chart, nursing note and vitals Reviewed previous: labs and CT scan Interpretation: labs and CT scan    Results for orders placed or performed during the hospital encounter of 11/10/16  CBC with Differential  Result Value Ref Range   WBC 10.1 4.0 - 10.5 K/uL   RBC 5.32 4.22 - 5.81 MIL/uL     Hemoglobin 16.0 13.0 - 17.0 g/dL   HCT 98.1 19.1 - 47.8 %   MCV 89.7 78.0 - 100.0 fL   MCH 30.1 26.0 - 34.0 pg   MCHC 33.5 30.0 - 36.0 g/dL   RDW 29.5 62.1 - 30.8 %   Platelets 312 150 - 400 K/uL   Neutrophils Relative % 63 %   Neutro Abs 6.3 1.7 - 7.7 K/uL   Lymphocytes Relative 23 %   Lymphs Abs 2.3 0.7 - 4.0 K/uL   Monocytes Relative 11 %   Monocytes Absolute 1.1 (H) 0.1 - 1.0 K/uL   Eosinophils Relative 3 %   Eosinophils Absolute 0.3 0.0 - 0.7 K/uL   Basophils Relative 0 %   Basophils Absolute 0.0 0.0 - 0.1 K/uL  Basic metabolic panel  Result Value Ref Range   Sodium 134 (L) 135 - 145 mmol/L   Potassium 4.0 3.5 - 5.1 mmol/L   Chloride 98 (L) 101 - 111 mmol/L   CO2 26 22 - 32 mmol/L   Glucose, Bld 99 65 - 99 mg/dL   BUN 26 (H) 6 - 20 mg/dL   Creatinine, Ser 6.57 (H) 0.61 - 1.24 mg/dL   Calcium 9.2 8.9 - 84.6 mg/dL   GFR calc non Af Amer 41 (L) >60 mL/min   GFR calc Af  Amer 47 (L) >60 mL/min   Anion gap 10 5 - 15   Results for Roselind MessierSHELTON, Havish C (MRN [id number]) as of 11/10/2016 09:57  Ref. Range 11/02/2014 04:43 11/03/2014 05:00 12/02/2014 21:12 01/04/2015 15:33 11/10/2016 09:29  BUN Latest Ref Range: 6 - 20 mg/dL 38 (H) 30 (H) 54 (H) 23 (H) 26 (H)  Creatinine Latest Ref Range: 0.61 - 1.24 mg/dL 0.930.86 8.181.00 2.991.47 (H) 3.711.13 1.54 (H)   Ct Renal Stone Study Result Date: 11/10/2016 CLINICAL DATA:  Lower pelvic pain, urinary retention for 2-3 days. EXAM: CT ABDOMEN AND PELVIS WITHOUT CONTRAST TECHNIQUE: Multidetector CT imaging of the abdomen and pelvis was performed following the standard protocol without IV contrast. COMPARISON:  10/31/2014 FINDINGS: Lower chest: Moderate-sized hiatal hernia present. Heart is normal size. Lung bases clear. No effusions. Hepatobiliary: Multiple gallstones within the gallbladder. Mildly prominent common bile duct, measuring up to 10 mm, slightly larger than prior study. No visible distal ureteral stone or intrahepatic biliary duct dilatation. No focal  hepatic abnormality. Pancreas: No focal abnormality or ductal dilatation. Spleen: No focal abnormality.  Normal size. Adrenals/Urinary Tract: Small low-density area posteriorly in the midpole of the right kidney, likely small cyst. This is unchanged. No hydronephrosis. No renal or ureteral stones. Foley catheter is present in the bladder. Bladder wall irregularity noted. Calcifications noted within the bladder lumen or bladder wall as seen on prior CT, not significantly changed. Adrenal glands unremarkable. Stomach/Bowel: Diffuse colonic diverticulosis. Large stool burden throughout the colon. There is mild wall thickening in the rectosigmoid colon with large amounts of stool. Mild haziness noted around the mid sigmoid colon. Abrupt termination of stool and gas in the region of the rectum. Cannot exclude rectal mass. Recommend correlation with digital rectal exam. Small bowel and stomach decompressed, unremarkable. Vascular/Lymphatic: Diffuse aortic and iliac calcifications. No aneurysm or adenopathy. Reproductive: Mild prostate enlargement. Other: No free fluid or free air. Musculoskeletal: No acute bony abnormality. Degenerative changes in the lumbar spine. IMPRESSION: Large stool burden throughout the entire colon, most pronounced in the rectosigmoid colon. There is abrupt termination of gas and stool at the region of the rectum with air soft tissue fullness. Cannot exclude soft tissue rectal mass. Recommend correlation with digital rectal exam and possible colonoscopy. There is mild wall thickening in the sigmoid colon with surrounding inflammatory changes suggesting colitis. Diffuse colonic diverticulosis. Cholelithiasis. Mildly dilated common bile duct without visible distal stones. Foley catheter in place within the bladder. Bladder wall wall is irregular with possible calcifications, similar to prior study. Moderate-sized hiatal hernia. Aortoiliac atherosclerosis. Electronically Signed   By: Charlett NoseKevin  Dover  M.D.   On: 11/10/2016 11:47     0955:  Bladder scan with 102300ml+; will place foley. Soft fecal stool ball in rectal vault; softened further and pt will ambulate to bathroom after foley placement.   1535:  +UTI, UC pending. CT with acute diverticulitis. Will dose IV cipro/flagyl. Dx and testing d/w pt and family.  Questions answered.  Verb understanding, agreeable to admit. T/C to [facility] Dr. Emmie Niemannriad Dr. Ophelia CharterYates, case discussed, including:  HPI, pertinent PM/SHx, VS/PE, dx testing, ED course and treatment:  Agreeable to observation admit.    Final Clinical Impressions(s) / ED Diagnoses   Final diagnoses:  None    New Prescriptions New Prescriptions   No medications on file     Samuel JesterKathleen Jupiter Boys, DO 11/14/16 1647

## 2016-11-11 ENCOUNTER — Encounter (HOSPITAL_COMMUNITY): Payer: Self-pay

## 2016-11-11 DIAGNOSIS — Z9889 Other specified postprocedural states: Secondary | ICD-10-CM | POA: Diagnosis not present

## 2016-11-11 DIAGNOSIS — R310 Gross hematuria: Secondary | ICD-10-CM | POA: Diagnosis present

## 2016-11-11 DIAGNOSIS — B379 Candidiasis, unspecified: Secondary | ICD-10-CM | POA: Diagnosis present

## 2016-11-11 DIAGNOSIS — R339 Retention of urine, unspecified: Secondary | ICD-10-CM | POA: Diagnosis present

## 2016-11-11 DIAGNOSIS — E039 Hypothyroidism, unspecified: Secondary | ICD-10-CM | POA: Diagnosis present

## 2016-11-11 DIAGNOSIS — R159 Full incontinence of feces: Secondary | ICD-10-CM | POA: Diagnosis present

## 2016-11-11 DIAGNOSIS — K5900 Constipation, unspecified: Secondary | ICD-10-CM | POA: Diagnosis not present

## 2016-11-11 DIAGNOSIS — N179 Acute kidney failure, unspecified: Secondary | ICD-10-CM | POA: Diagnosis present

## 2016-11-11 DIAGNOSIS — R32 Unspecified urinary incontinence: Secondary | ICD-10-CM | POA: Diagnosis present

## 2016-11-11 DIAGNOSIS — R933 Abnormal findings on diagnostic imaging of other parts of digestive tract: Secondary | ICD-10-CM | POA: Diagnosis not present

## 2016-11-11 DIAGNOSIS — N390 Urinary tract infection, site not specified: Secondary | ICD-10-CM | POA: Diagnosis present

## 2016-11-11 DIAGNOSIS — Z79899 Other long term (current) drug therapy: Secondary | ICD-10-CM | POA: Diagnosis not present

## 2016-11-11 DIAGNOSIS — K5909 Other constipation: Secondary | ICD-10-CM | POA: Diagnosis present

## 2016-11-11 LAB — BASIC METABOLIC PANEL
Anion gap: 8 (ref 5–15)
BUN: 25 mg/dL — ABNORMAL HIGH (ref 6–20)
CO2: 27 mmol/L (ref 22–32)
Calcium: 8.5 mg/dL — ABNORMAL LOW (ref 8.9–10.3)
Chloride: 99 mmol/L — ABNORMAL LOW (ref 101–111)
Creatinine, Ser: 1.28 mg/dL — ABNORMAL HIGH (ref 0.61–1.24)
GFR calc Af Amer: 59 mL/min — ABNORMAL LOW (ref >60)
GFR calc non Af Amer: 51 mL/min — ABNORMAL LOW (ref >60)
GLUCOSE: 95 mg/dL (ref 65–99)
POTASSIUM: 3.9 mmol/L (ref 3.5–5.1)
SODIUM: 134 mmol/L — AB (ref 135–145)

## 2016-11-11 LAB — CBC
HEMATOCRIT: 42.2 % (ref 39.0–52.0)
Hemoglobin: 13.9 g/dL (ref 13.0–17.0)
MCH: 29.3 pg (ref 26.0–34.0)
MCHC: 32.9 g/dL (ref 30.0–36.0)
MCV: 89 fL (ref 78.0–100.0)
Platelets: 249 10*3/uL (ref 150–400)
RBC: 4.74 MIL/uL (ref 4.22–5.81)
RDW: 13.9 % (ref 11.5–15.5)
WBC: 8.3 10*3/uL (ref 4.0–10.5)

## 2016-11-11 LAB — URINE CULTURE: Culture: NO GROWTH

## 2016-11-11 MED ORDER — SODIUM CHLORIDE 0.9 % IV SOLN
INTRAVENOUS | Status: DC
  Administered 2016-11-11: 22:00:00 via INTRAVENOUS

## 2016-11-11 NOTE — Progress Notes (Signed)
Patient admitted with chronic constipation has never had a colonoscopy according to he and his wife currently BUN being given mag citrate by mouth. Patient likewise had TURP 3 years ago had 1 L residual volume of bladder will give Flomax to aid this will ask gastroenterology to evaluate regarding possible need for colonoscopy Benjamin MessierWillie C [name] DOB: December 15, 1932 DOA: 11/10/2016 PCP: Benjamin StallingNDIEGO,Benjamin [name]   Physical Exam: Blood pressure 121/60, pulse 71, temperature 98.1 F (36.7 C), temperature source Oral, resp. rate 18, height 5\' 10"  (1.778 [name], weight 58.4 kg (128 lb 12.8 oz), SpO2 95 %. Lungs clear to A&P no rales rhonchi heart regular rhythm no S3-S4 no heaves thrills rubs abdomen soft nontender bowel sounds normoactive   Investigations:  Recent Results (from the past 240 hour(s))  Urine culture     Status: None   Collection Time: 11/10/16  9:29 AM  Result Value Ref Range Status   Specimen Description URINE, CATHETERIZED  Final   Special Requests NONE  Final   Culture   Final    NO GROWTH Performed at [facility], [address] [place], [place] [address]    Report Status 11/11/2016 FINAL  Final     Basic Metabolic Panel:  Recent Labs  47/82/9503/26/18 0929 11/11/16 0511  NA 134* 134*  K 4.0 3.9  CL 98* 99*  CO2 26 27  GLUCOSE 99 95  BUN 26* 25*  CREATININE 1.54* 1.28*  CALCIUM 9.2 8.5*   Liver Function Tests: No results for input(s): AST, ALT, ALKPHOS, BILITOT, PROT, ALBUMIN in the last 72 hours.   CBC:  Recent Labs  11/10/16 0929 11/11/16 0511  WBC 10.1 8.3  NEUTROABS 6.3  --   HGB 16.0 13.9  HCT 47.7 42.2  MCV 89.7 89.0  PLT 312 249    Ct Renal Stone Study  Result Date: 11/10/2016 CLINICAL DATA:  Lower pelvic pain, urinary retention for 2-3 days. EXAM: CT ABDOMEN AND PELVIS WITHOUT CONTRAST TECHNIQUE: Multidetector CT imaging of the abdomen and pelvis was performed following the standard protocol without IV contrast. COMPARISON:  10/31/2014  FINDINGS: Lower chest: Moderate-sized hiatal hernia present. Heart is normal size. Lung bases clear. No effusions. Hepatobiliary: Multiple gallstones within the gallbladder. Mildly prominent common bile duct, measuring up to 10 mm, slightly larger than prior study. No visible distal ureteral stone or intrahepatic biliary duct dilatation. No focal hepatic abnormality. Pancreas: No focal abnormality or ductal dilatation. Spleen: No focal abnormality.  Normal size. Adrenals/Urinary Tract: Small low-density area posteriorly in the midpole of the right kidney, likely small cyst. This is unchanged. No hydronephrosis. No renal or ureteral stones. Foley catheter is present in the bladder. Bladder wall irregularity noted. Calcifications noted within the bladder lumen or bladder wall as seen on prior CT, not significantly changed. Adrenal glands unremarkable. Stomach/Bowel: Diffuse colonic diverticulosis. Large stool burden throughout the colon. There is mild wall thickening in the rectosigmoid colon with large amounts of stool. Mild haziness noted around the mid sigmoid colon. Abrupt termination of stool and gas in the region of the rectum. Cannot exclude rectal mass. Recommend correlation with digital rectal exam. Small bowel and stomach decompressed, unremarkable. Vascular/Lymphatic: Diffuse aortic and iliac calcifications. No aneurysm or adenopathy. Reproductive: Mild prostate enlargement. Other: No free fluid or free air. Musculoskeletal: No acute bony abnormality. Degenerative changes in the lumbar spine. IMPRESSION: Large stool burden throughout the entire colon, most pronounced in the rectosigmoid colon. There is abrupt termination of gas and stool at the region of  the rectum with air soft tissue fullness. Cannot exclude soft tissue rectal mass. Recommend correlation with digital rectal exam and possible colonoscopy. There is mild wall thickening in the sigmoid colon with surrounding inflammatory changes suggesting  colitis. Diffuse colonic diverticulosis. Cholelithiasis. Mildly dilated common bile duct without visible distal stones. Foley catheter in place within the bladder. Bladder wall wall is irregular with possible calcifications, similar to prior study. Moderate-sized hiatal hernia. Aortoiliac atherosclerosis. Electronically Signed   By: Charlett Nose [name].D.   On: 11/10/2016 11:47      Medications:   Impression:  Principal Problem:   Constipation Active Problems:   Hypothyroidism   Acute lower UTI   Acute renal failure (ARF) (HCC)   Thrush     Plan: GI consultation for evaluation possible colonoscopy. Flomax 0.4 mg by mouth daily  Consultants: Gastroenterology requested    Procedures Foley placed   Antibiotics: Rocephin 1 g IV every 24 hours        Time spent: 30 minutes   LOS: 0 days   Benjamin [name] [name]   11/11/2016, 2:01 PM

## 2016-11-11 NOTE — Consult Note (Signed)
Referring Provider: Dr. Janna ArchonDiego Primary Care Physician:  [name],RICHARD M, MD Primary Gastroenterologist:  Dr. Darrick PennaFields   Date of Admission: 11/10/16 Date of Consultation: 11/11/16  Reason for Consultation:  Chronic constipation  HPI:  Benjamin [name] is a pleasant 83 y.o. year old male with a history of intermittent constipation, presenting to the ED yesterday with 10 days' worsening symptoms of constipation, pain in lower abdomen. States he had resorted to taking multiple doses of epsom salts, have diarrhea, and then take multiple doses of imodium. No overt GI bleeding. He has lost about 15 lbs in last 2 weeks per his report. At time of consultation, he has had multiple bowel movements after taking magnesium citrate. Feels much improved after magnesium citrate. Feels like his abdomen is much less distended and almost near baseline.   CT reviewed with Dr. Kearney Hardover. Haziness of mesentery around sigmoid, thickened sigmoid colon, no concern for diverticulitis in this area. Region of concern for possible soft tissue rectal mass noted about 5-6 cm from anal opening per Dr. Kearney Hardover. Last colonoscopy in 2011 by Dr. Darrick PennaFields with frequent diverticula, lymphocytic colitis on path.   Denies any upper GI symptoms. No N/V, no dysphagia. Afebrile. No evidence of leukocytosis on CBC. Heme negative in ED.   Past Medical History:  Diagnosis Date  . Hypercholesteremia   . Thyroid disease   . Urinary retention     Past Surgical History:  Procedure Laterality Date  . BACK SURGERY     sciatic nerve   . COLONOSCOPY  10/2009   Dr. Darrick PennaFields: frequent diverticula, pathology with lymphocytic colitis. Large internal hemorrhoids.   . TRANSURETHRAL RESECTION OF BLADDER TUMOR Bilateral 10/31/2014   Procedure: CYSTO, BLADDER BIOPSY/CLOT EVACUATION, , BILATERAL RETROGRADE PYELOGRAM,BILATERAL URETERAL STENT PLACEMENT;  Surgeon: Sebastian Acheheodore Manny, MD;  Location: [facility];  Service: Urology;  Laterality: Bilateral;    Prior  to Admission medications   Medication Sig Start Date End Date Taking? Authorizing Provider  levothyroxine (SYNTHROID, LEVOTHROID) 100 MCG tablet Take 100 mcg by mouth daily. 09/27/14  Yes Historical Provider, MD  pravastatin (PRAVACHOL) 40 MG tablet Take 40 mg by mouth daily. 10/20/14  Yes Historical Provider, MD    Current Facility-Administered Medications  Medication Dose Route Frequency Provider Last Rate Last Dose  . acetaminophen (TYLENOL) tablet 650 mg  650 mg Oral Q6H PRN Jonah BlueJennifer Yates, MD   650 mg at 11/10/16 1832   Or  . acetaminophen (TYLENOL) suppository 650 mg  650 mg Rectal Q6H PRN Jonah BlueJennifer Yates, MD      . bisacodyl (DULCOLAX) EC tablet 5 mg  5 mg Oral Daily PRN Jonah BlueJennifer Yates, MD   5 mg at 11/11/16 1020  . cefTRIAXone (ROCEPHIN) 1 g in dextrose 5 % 50 mL IVPB  1 g Intravenous Q24H Jonah BlueJennifer Yates, MD   1 g at 11/10/16 1954  . docusate sodium (COLACE) capsule 100 mg  100 mg Oral BID Jonah BlueJennifer Yates, MD   100 mg at 11/11/16 1020  . enoxaparin (LOVENOX) injection 40 mg  40 mg Subcutaneous Q24H Jonah BlueJennifer Yates, MD   40 mg at 11/10/16 2106  . lactated ringers infusion   Intravenous Continuous Jonah BlueJennifer Yates, MD 75 mL/hr at 11/11/16 0850    . levothyroxine (SYNTHROID, LEVOTHROID) tablet 100 mcg  100 mcg Oral QAC breakfast Jonah BlueJennifer Yates, MD   100 mcg at 11/11/16 1020  . magic mouthwash  10 mL Oral QID Jonah BlueJennifer Yates, MD   10 mL at 11/11/16 1331  . ondansetron (ZOFRAN) tablet 4  mg  4 mg Oral Q6H PRN Jonah Blue, MD       Or  . ondansetron [facility]) injection 4 mg  4 mg Intravenous Q6H PRN Jonah Blue, MD      . polyethylene glycol (MIRALAX / GLYCOLAX) packet 17 g  17 g Oral Daily PRN Jonah Blue, MD   17 g at 11/11/16 1021  . pravastatin (PRAVACHOL) tablet 40 mg  40 mg Oral q1800 Jonah Blue, MD   40 mg at 11/10/16 1832    Allergies as of 11/10/2016  . (No Known Allergies)    Family History  Problem Relation Age of Onset  . Colon cancer Neg Hx     Social History    Social History  . Marital status: Married    Spouse name: N/A  . Number of children: N/A  . Years of education: N/A   Occupational History  . retired    Social History Main Topics  . Smoking status: Never Smoker  . Smokeless tobacco: Never Used  . Alcohol use No  . Drug use: No  . Sexual activity: Not on file   Other Topics Concern  . Not on file   Social History Narrative  . No narrative on file    Review of Systems: Gen: see HPI  CV: Denies chest pain, heart palpitations, syncope, edema  Resp: Denies shortness of breath with rest, cough, wheezing GI: see HPI  GU : see HPI  MS: Denies joint pain,swelling, cramping Derm: Denies rash, itching, dry skin Psych: Denies depression, anxiety,confusion, or memory loss Heme: Denies bruising, bleeding, and enlarged lymph nodes.  Physical Exam: Vital signs in last 24 hours: Temp:  [97.7 F (36.5 C)-98.1 F (36.7 C)] 98.1 F (36.7 C) (03/27 0448) Pulse Rate:  [65-73] 71 (03/27 0448) Resp:  [18] 18 (03/27 0448) BP: (112-140)/(58-67) 121/60 (03/27 0448) SpO2:  [95 %-98 %] 95 % (03/27 0448) Weight:  [128 lb 12.8 oz (58.4 kg)] 128 lb 12.8 oz (58.4 kg) (03/26 1754) Last BM Date: 11/02/16 General:   Alert, thin but not cachectic-appearing  Head:  Normocephalic and atraumatic. Eyes:  Sclera clear, no icterus.   Conjunctiva pink. Ears:  Normal auditory acuity. Nose:  No deformity, discharge,  or lesions. Mouth:  Poor dentition  Lungs:  Clear throughout to auscultation.  Heart:  Regular rate and rhythm; no murmurs, clicks, rubs,  or gallops. Abdomen:  Soft, nontender and nondistended. No masses, hepatosplenomegaly or hernias noted. Normal bowel sounds, without guarding, and without rebound.   Rectal:  Large external hemorrhoid tag, internal exam without obvious palpable rectal mass; slight discomfort, no obvious gross bleeding, stool brown, no fecal impaction   Msk:  Symmetrical without gross deformities. Normal  posture. Extremities:  Without  edema. Neurologic:  Alert and  oriented x4 Psych:  Alert and cooperative. Normal mood and affect.  Intake/Output from previous day: 03/26 0701 - 03/27 0700 In: 857.5 [I.V.:707.5; IV Piggyback:150] Out: 1660 [Urine:1660] Intake/Output this shift: Total I/O In: 1000 [I.V.:1000] Out: -   Lab Results:  Recent Labs  11/10/16 0929 11/11/16 0511  WBC 10.1 8.3  HGB 16.0 13.9  HCT 47.7 42.2  PLT 312 249   BMET  Recent Labs  11/10/16 0929 11/11/16 0511  NA 134* 134*  K 4.0 3.9  CL 98* 99*  CO2 26 27  GLUCOSE 99 95  BUN 26* 25*  CREATININE 1.54* 1.28*  CALCIUM 9.2 8.5*   Studies/Results: Ct Renal Stone Study  Result Date: 11/10/2016 CLINICAL DATA:  Lower  pelvic pain, urinary retention for 2-3 days. EXAM: CT ABDOMEN AND PELVIS WITHOUT CONTRAST TECHNIQUE: Multidetector CT imaging of the abdomen and pelvis was performed following the standard protocol without IV contrast. COMPARISON:  10/31/2014 FINDINGS: Lower chest: Moderate-sized hiatal hernia present. Heart is normal size. Lung bases clear. No effusions. Hepatobiliary: Multiple gallstones within the gallbladder. Mildly prominent common bile duct, measuring up to 10 mm, slightly larger than prior study. No visible distal ureteral stone or intrahepatic biliary duct dilatation. No focal hepatic abnormality. Pancreas: No focal abnormality or ductal dilatation. Spleen: No focal abnormality.  Normal size. Adrenals/Urinary Tract: Small low-density area posteriorly in the midpole of the right kidney, likely small cyst. This is unchanged. No hydronephrosis. No renal or ureteral stones. Foley catheter is present in the bladder. Bladder wall irregularity noted. Calcifications noted within the bladder lumen or bladder wall as seen on prior CT, not significantly changed. Adrenal glands unremarkable. Stomach/Bowel: Diffuse colonic diverticulosis. Large stool burden throughout the colon. There is mild wall thickening  in the rectosigmoid colon with large amounts of stool. Mild haziness noted around the mid sigmoid colon. Abrupt termination of stool and gas in the region of the rectum. Cannot exclude rectal mass. Recommend correlation with digital rectal exam. Small bowel and stomach decompressed, unremarkable. Vascular/Lymphatic: Diffuse aortic and iliac calcifications. No aneurysm or adenopathy. Reproductive: Mild prostate enlargement. Other: No free fluid or free air. Musculoskeletal: No acute bony abnormality. Degenerative changes in the lumbar spine. IMPRESSION: Large stool burden throughout the entire colon, most pronounced in the rectosigmoid colon. There is abrupt termination of gas and stool at the region of the rectum with air soft tissue fullness. Cannot exclude soft tissue rectal mass. Recommend correlation with digital rectal exam and possible colonoscopy. There is mild wall thickening in the sigmoid colon with surrounding inflammatory changes suggesting colitis. Diffuse colonic diverticulosis. Cholelithiasis. Mildly dilated common bile duct without visible distal stones. Foley catheter in place within the bladder. Bladder wall wall is irregular with possible calcifications, similar to prior study. Moderate-sized hiatal hernia. Aortoiliac atherosclerosis. Electronically Signed   By: Charlett Nose M.D.   On: 11/10/2016 11:47    Impression: 83 year old very pleasant male admitted with lower abdominal pain, constipation, and CT findings as noted above. He has had multiple bowel movements and clinically feels improved after magnesium citrate, colace, and Miralax. He notes a cycle of taking epsom salts for constipation, followed by imodium due to diarrhea. Clinically, he is improved on admission. CT reviewed with Dr. Kearney Hard and findings of concern for soft tissue rectal mass, mild wall thickening of sigmoid colon with surround inflammatory changes. Clinically, does not appear to have colitis. Hold on empiric antibiotics.  Rectal exam without obvious rectal mass both by ED and myself. Recommend continue bowel regimen and direct visualization via flexible sigmoidoscopy in the near future. Last colonoscopy was in 2011 with frequent diverticula, lymphocytic colitis.    Plan: Full liquids Miralax daily Recommend further evaluation of CT findings endoscopically via flexible sigmoidoscopy. To discuss further with Dr. Darrick Penna  NPO after midnight Will continue to follow with you   Benjamin [name], ANP-BC [facility]     LOS: 0 days    11/11/2016, 4:41 PM

## 2016-11-11 NOTE — Care Management Obs Status (Signed)
MEDICARE OBSERVATION STATUS NOTIFICATION   Patient Details  Name: Benjamin [name] MRN: [id number] Date of Birth: 1933/08/07   Medicare Observation Status Notification Given:  Yes    Marx Doig, Chrystine OilerSharley Diane, RN 11/11/2016, 11:04 AM

## 2016-11-11 NOTE — Care Management Note (Signed)
Case Management Note  Patient Details  Name: Roselind MessierWillie C Burroughs MRN: [id number] Date of Birth: 24-Aug-1932  Subjective/Objective:  Adm with UTI/constipation. He lives with wife, ind with ADL's. No DME or HH PTA. He has a PCP, still drives to appointments and reports no issues affording medications.   Action/Plan: Anticipate DC home with self care. No known CM needs.    Expected Discharge Date:     11/12/2016             Expected Discharge Plan:  Home/Self Care  In-House Referral:     Discharge planning Services  CM Consult  Post Acute Care Choice:  NA Choice offered to:  NA  DME Arranged:    DME Agency:     HH Arranged:    HH Agency:     Status of Service:  Completed, signed off  If discussed at [employer] Length of Stay Meetings, dates discussed:    Additional Comments:  Naren Benally, Chrystine OilerSharley Diane, RN 11/11/2016, 11:22 AM

## 2016-11-12 ENCOUNTER — Encounter (HOSPITAL_COMMUNITY)
Admission: EM | Disposition: A | Discharge status: Home / Self Care | Payer: Self-pay | Source: Home / Self Care | Attending: Family Medicine

## 2016-11-12 ENCOUNTER — Encounter (HOSPITAL_COMMUNITY): Payer: Self-pay | Admitting: *Deleted

## 2016-11-12 DIAGNOSIS — R933 Abnormal findings on diagnostic imaging of other parts of digestive tract: Secondary | ICD-10-CM

## 2016-11-12 HISTORY — PX: FLEXIBLE SIGMOIDOSCOPY: SHX5431

## 2016-11-12 LAB — BASIC METABOLIC PANEL
ANION GAP: 7 (ref 5–15)
BUN: 18 mg/dL (ref 6–20)
CHLORIDE: 102 mmol/L (ref 101–111)
CO2: 28 mmol/L (ref 22–32)
Calcium: 8.4 mg/dL — ABNORMAL LOW (ref 8.9–10.3)
Creatinine, Ser: 1.15 mg/dL (ref 0.61–1.24)
GFR, EST NON AFRICAN AMERICAN: 58 mL/min — AB (ref >60)
Glucose, Bld: 100 mg/dL — ABNORMAL HIGH (ref 65–99)
POTASSIUM: 3.9 mmol/L (ref 3.5–5.1)
SODIUM: 137 mmol/L (ref 135–145)

## 2016-11-12 SURGERY — SIGMOIDOSCOPY, FLEXIBLE
Anesthesia: Moderate Sedation

## 2016-11-12 MED ORDER — TAMSULOSIN HCL 0.4 MG PO CAPS
0.4000 mg | ORAL_CAPSULE | Freq: Every day | ORAL | Status: DC
  Administered 2016-11-12 – 2016-11-13 (×2): 0.4 mg via ORAL
  Filled 2016-11-12 (×2): qty 1

## 2016-11-12 MED ORDER — ONDANSETRON HCL 4 MG/2ML IJ SOLN
INTRAMUSCULAR | Status: DC | PRN
  Administered 2016-11-12: 4 mg via INTRAVENOUS

## 2016-11-12 MED ORDER — MEPERIDINE HCL 100 MG/ML IJ SOLN
INTRAMUSCULAR | Status: DC | PRN
  Administered 2016-11-12: 25 mg via INTRAVENOUS
  Administered 2016-11-12: 50 mg via INTRAVENOUS

## 2016-11-12 MED ORDER — MEPERIDINE HCL 100 MG/ML IJ SOLN
INTRAMUSCULAR | Status: AC
  Filled 2016-11-12: qty 2

## 2016-11-12 MED ORDER — SODIUM CHLORIDE 0.9 % IV SOLN
INTRAVENOUS | Status: DC
  Administered 2016-11-12: 14:00:00 via INTRAVENOUS

## 2016-11-12 MED ORDER — MIDAZOLAM HCL 5 MG/5ML IJ SOLN
INTRAMUSCULAR | Status: AC
  Filled 2016-11-12: qty 10

## 2016-11-12 MED ORDER — ONDANSETRON HCL 4 MG/2ML IJ SOLN
INTRAMUSCULAR | Status: AC
  Filled 2016-11-12: qty 2

## 2016-11-12 MED ORDER — STERILE WATER FOR IRRIGATION IR SOLN
Status: DC | PRN
  Administered 2016-11-12: 15:00:00

## 2016-11-12 MED ORDER — MIDAZOLAM HCL 5 MG/5ML IJ SOLN
INTRAMUSCULAR | Status: DC | PRN
  Administered 2016-11-12 (×2): 2 mg via INTRAVENOUS

## 2016-11-12 NOTE — Progress Notes (Signed)
Awaiting flexible sigmoidoscopy this afternoon patient hemodynamically stable lab within normal fremitus Benjamin MessierWillie C [name] [id number] DOB: 08/26/1932 DOA: 11/10/2016 PCP: Benjamin StallingNDIEGO,Benjamin Bogue M, MD   Physical Exam: Blood pressure 121/63, pulse 67, temperature 97.8 F (36.6 C), temperature source Oral, resp. rate 18, height 5\' 10"  (1.778 [name], weight 58.4 kg (128 lb 12.8 oz), SpO2 98 %. Lungs clear to A&P no rales wheeze rhonchi heart regular rhythm no murmurs goes heaves thrills rubs abdomen soft nontender bowel sounds normoactive   Investigations:  Recent Results (from the past 240 hour(s))  Urine culture     Status: None   Collection Time: 11/10/16  9:29 AM  Result Value Ref Range Status   Specimen Description URINE, CATHETERIZED  Final   Special Requests NONE  Final   Culture   Final    NO GROWTH Performed at [facility], [address] [place], [place] [address]    Report Status 11/11/2016 FINAL  Final     Basic Metabolic Panel:  Recent Labs  08/65/7803/27/18 0511 11/12/16 0629  NA 134* 137  K 3.9 3.9  CL 99* 102  CO2 27 28  GLUCOSE 95 100*  BUN 25* 18  CREATININE 1.28* 1.15  CALCIUM 8.5* 8.4*   Liver Function Tests: No results for input(s): AST, ALT, ALKPHOS, BILITOT, PROT, ALBUMIN in the last 72 hours.   CBC:  Recent Labs  11/10/16 0929 11/11/16 0511  WBC 10.1 8.3  NEUTROABS 6.3  --   HGB 16.0 13.9  HCT 47.7 42.2  MCV 89.7 89.0  PLT 312 249    No results found.    Medications:  Impression:  Principal Problem:   Constipation Active Problems:   Hypothyroidism   Acute lower UTI   Acute renal failure (ARF) (HCC)   Thrush     Plan:Flexible sigmoidoscopy later today. Flomax 0.4 mg by mouth daily  Consultants: Gastroenterology   Procedures flexible sigmoidoscopy   Antibiotics:           Time spent: 30 minutes   LOS: 1 day   Benjamin [name] [name]   11/12/2016, 12:52 PM

## 2016-11-12 NOTE — Progress Notes (Signed)
Pt alerted nurse Findley Blankenbaker to small reddened areas to back and arms, he states they were itching earlier but not at this moment, and denies and allergies. Instructed pt to mention to physician and that I would also pass on to oncoming nurse.

## 2016-11-12 NOTE — H&P (View-Only) (Signed)
Referring Provider: Dr. Janna ArchonDiego Primary Care Physician:  [name],RICHARD M, MD Primary Gastroenterologist:  Dr. Darrick PennaFields   Date of Admission: 11/10/16 Date of Consultation: 11/11/16  Reason for Consultation:  Chronic constipation  HPI:  Benjamin MessierWillie C Facemire is a pleasant 83 y.o. year old male with a history of intermittent constipation, presenting to the ED yesterday with 10 days' worsening symptoms of constipation, pain in lower abdomen. States he had resorted to taking multiple doses of epsom salts, have diarrhea, and then take multiple doses of imodium. No overt GI bleeding. He has lost about 15 lbs in last 2 weeks per his report. At time of consultation, he has had multiple bowel movements after taking magnesium citrate. Feels much improved after magnesium citrate. Feels like his abdomen is much less distended and almost near baseline.   CT reviewed with Dr. Kearney Hardover. Haziness of mesentery around sigmoid, thickened sigmoid colon, no concern for diverticulitis in this area. Region of concern for possible soft tissue rectal mass noted about 5-6 cm from anal opening per Dr. Kearney Hardover. Last colonoscopy in 2011 by Dr. Darrick PennaFields with frequent diverticula, lymphocytic colitis on path.   Denies any upper GI symptoms. No N/V, no dysphagia. Afebrile. No evidence of leukocytosis on CBC. Heme negative in ED.   Past Medical History:  Diagnosis Date  . Hypercholesteremia   . Thyroid disease   . Urinary retention     Past Surgical History:  Procedure Laterality Date  . BACK SURGERY     sciatic nerve   . COLONOSCOPY  10/2009   Dr. Darrick PennaFields: frequent diverticula, pathology with lymphocytic colitis. Large internal hemorrhoids.   . TRANSURETHRAL RESECTION OF BLADDER TUMOR Bilateral 10/31/2014   Procedure: CYSTO, BLADDER BIOPSY/CLOT EVACUATION, , BILATERAL RETROGRADE PYELOGRAM,BILATERAL URETERAL STENT PLACEMENT;  Surgeon: Sebastian Acheheodore Manny, MD;  Location: [facility];  Service: Urology;  Laterality: Bilateral;    Prior  to Admission medications   Medication Sig Start Date End Date Taking? Authorizing Provider  levothyroxine (SYNTHROID, LEVOTHROID) 100 MCG tablet Take 100 mcg by mouth daily. 09/27/14  Yes Historical Provider, MD  pravastatin (PRAVACHOL) 40 MG tablet Take 40 mg by mouth daily. 10/20/14  Yes Historical Provider, MD    Current Facility-Administered Medications  Medication Dose Route Frequency Provider Last Rate Last Dose  . acetaminophen (TYLENOL) tablet 650 mg  650 mg Oral Q6H PRN Jonah BlueJennifer Yates, MD   650 mg at 11/10/16 1832   Or  . acetaminophen (TYLENOL) suppository 650 mg  650 mg Rectal Q6H PRN Jonah BlueJennifer Yates, MD      . bisacodyl (DULCOLAX) EC tablet 5 mg  5 mg Oral Daily PRN Jonah BlueJennifer Yates, MD   5 mg at 11/11/16 1020  . cefTRIAXone (ROCEPHIN) 1 g in dextrose 5 % 50 mL IVPB  1 g Intravenous Q24H Jonah BlueJennifer Yates, MD   1 g at 11/10/16 1954  . docusate sodium (COLACE) capsule 100 mg  100 mg Oral BID Jonah BlueJennifer Yates, MD   100 mg at 11/11/16 1020  . enoxaparin (LOVENOX) injection 40 mg  40 mg Subcutaneous Q24H Jonah BlueJennifer Yates, MD   40 mg at 11/10/16 2106  . lactated ringers infusion   Intravenous Continuous Jonah BlueJennifer Yates, MD 75 mL/hr at 11/11/16 0850    . levothyroxine (SYNTHROID, LEVOTHROID) tablet 100 mcg  100 mcg Oral QAC breakfast Jonah BlueJennifer Yates, MD   100 mcg at 11/11/16 1020  . magic mouthwash  10 mL Oral QID Jonah BlueJennifer Yates, MD   10 mL at 11/11/16 1331  . ondansetron (ZOFRAN) tablet 4  mg  4 mg Oral Q6H PRN Jonah Blue, MD       Or  . ondansetron [facility]) injection 4 mg  4 mg Intravenous Q6H PRN Jonah Blue, MD      . polyethylene glycol (MIRALAX / GLYCOLAX) packet 17 g  17 g Oral Daily PRN Jonah Blue, MD   17 g at 11/11/16 1021  . pravastatin (PRAVACHOL) tablet 40 mg  40 mg Oral q1800 Jonah Blue, MD   40 mg at 11/10/16 1832    Allergies as of 11/10/2016  . (No Known Allergies)    Family History  Problem Relation Age of Onset  . Colon cancer Neg Hx     Social History    Social History  . Marital status: Married    Spouse name: N/A  . Number of children: N/A  . Years of education: N/A   Occupational History  . retired    Social History Main Topics  . Smoking status: Never Smoker  . Smokeless tobacco: Never Used  . Alcohol use No  . Drug use: No  . Sexual activity: Not on file   Other Topics Concern  . Not on file   Social History Narrative  . No narrative on file    Review of Systems: Gen: see HPI  CV: Denies chest pain, heart palpitations, syncope, edema  Resp: Denies shortness of breath with rest, cough, wheezing GI: see HPI  GU : see HPI  MS: Denies joint pain,swelling, cramping Derm: Denies rash, itching, dry skin Psych: Denies depression, anxiety,confusion, or memory loss Heme: Denies bruising, bleeding, and enlarged lymph nodes.  Physical Exam: Vital signs in last 24 hours: Temp:  [97.7 F (36.5 C)-98.1 F (36.7 C)] 98.1 F (36.7 C) (03/27 0448) Pulse Rate:  [65-73] 71 (03/27 0448) Resp:  [18] 18 (03/27 0448) BP: (112-140)/(58-67) 121/60 (03/27 0448) SpO2:  [95 %-98 %] 95 % (03/27 0448) Weight:  [128 lb 12.8 oz (58.4 kg)] 128 lb 12.8 oz (58.4 kg) (03/26 1754) Last BM Date: 11/02/16 General:   Alert, thin but not cachectic-appearing  Head:  Normocephalic and atraumatic. Eyes:  Sclera clear, no icterus.   Conjunctiva pink. Ears:  Normal auditory acuity. Nose:  No deformity, discharge,  or lesions. Mouth:  Poor dentition  Lungs:  Clear throughout to auscultation.  Heart:  Regular rate and rhythm; no murmurs, clicks, rubs,  or gallops. Abdomen:  Soft, nontender and nondistended. No masses, hepatosplenomegaly or hernias noted. Normal bowel sounds, without guarding, and without rebound.   Rectal:  Large external hemorrhoid tag, internal exam without obvious palpable rectal mass; slight discomfort, no obvious gross bleeding, stool brown, no fecal impaction   Msk:  Symmetrical without gross deformities. Normal  posture. Extremities:  Without  edema. Neurologic:  Alert and  oriented x4 Psych:  Alert and cooperative. Normal mood and affect.  Intake/Output from previous day: 03/26 0701 - 03/27 0700 In: 857.5 [I.V.:707.5; IV Piggyback:150] Out: 1660 [Urine:1660] Intake/Output this shift: Total I/O In: 1000 [I.V.:1000] Out: -   Lab Results:  Recent Labs  11/10/16 0929 11/11/16 0511  WBC 10.1 8.3  HGB 16.0 13.9  HCT 47.7 42.2  PLT 312 249   BMET  Recent Labs  11/10/16 0929 11/11/16 0511  NA 134* 134*  K 4.0 3.9  CL 98* 99*  CO2 26 27  GLUCOSE 99 95  BUN 26* 25*  CREATININE 1.54* 1.28*  CALCIUM 9.2 8.5*   Studies/Results: Ct Renal Stone Study  Result Date: 11/10/2016 CLINICAL DATA:  Lower  pelvic pain, urinary retention for 2-3 days. EXAM: CT ABDOMEN AND PELVIS WITHOUT CONTRAST TECHNIQUE: Multidetector CT imaging of the abdomen and pelvis was performed following the standard protocol without IV contrast. COMPARISON:  10/31/2014 FINDINGS: Lower chest: Moderate-sized hiatal hernia present. Heart is normal size. Lung bases clear. No effusions. Hepatobiliary: Multiple gallstones within the gallbladder. Mildly prominent common bile duct, measuring up to 10 mm, slightly larger than prior study. No visible distal ureteral stone or intrahepatic biliary duct dilatation. No focal hepatic abnormality. Pancreas: No focal abnormality or ductal dilatation. Spleen: No focal abnormality.  Normal size. Adrenals/Urinary Tract: Small low-density area posteriorly in the midpole of the right kidney, likely small cyst. This is unchanged. No hydronephrosis. No renal or ureteral stones. Foley catheter is present in the bladder. Bladder wall irregularity noted. Calcifications noted within the bladder lumen or bladder wall as seen on prior CT, not significantly changed. Adrenal glands unremarkable. Stomach/Bowel: Diffuse colonic diverticulosis. Large stool burden throughout the colon. There is mild wall thickening  in the rectosigmoid colon with large amounts of stool. Mild haziness noted around the mid sigmoid colon. Abrupt termination of stool and gas in the region of the rectum. Cannot exclude rectal mass. Recommend correlation with digital rectal exam. Small bowel and stomach decompressed, unremarkable. Vascular/Lymphatic: Diffuse aortic and iliac calcifications. No aneurysm or adenopathy. Reproductive: Mild prostate enlargement. Other: No free fluid or free air. Musculoskeletal: No acute bony abnormality. Degenerative changes in the lumbar spine. IMPRESSION: Large stool burden throughout the entire colon, most pronounced in the rectosigmoid colon. There is abrupt termination of gas and stool at the region of the rectum with air soft tissue fullness. Cannot exclude soft tissue rectal mass. Recommend correlation with digital rectal exam and possible colonoscopy. There is mild wall thickening in the sigmoid colon with surrounding inflammatory changes suggesting colitis. Diffuse colonic diverticulosis. Cholelithiasis. Mildly dilated common bile duct without visible distal stones. Foley catheter in place within the bladder. Bladder wall wall is irregular with possible calcifications, similar to prior study. Moderate-sized hiatal hernia. Aortoiliac atherosclerosis. Electronically Signed   By: Charlett Nose M.D.   On: 11/10/2016 11:47    Impression: 83 year old very pleasant male admitted with lower abdominal pain, constipation, and CT findings as noted above. He has had multiple bowel movements and clinically feels improved after magnesium citrate, colace, and Miralax. He notes a cycle of taking epsom salts for constipation, followed by imodium due to diarrhea. Clinically, he is improved on admission. CT reviewed with Dr. Kearney Hard and findings of concern for soft tissue rectal mass, mild wall thickening of sigmoid colon with surround inflammatory changes. Clinically, does not appear to have colitis. Hold on empiric antibiotics.  Rectal exam without obvious rectal mass both by ED and myself. Recommend continue bowel regimen and direct visualization via flexible sigmoidoscopy in the near future. Last colonoscopy was in 2011 with frequent diverticula, lymphocytic colitis.    Plan: Full liquids Miralax daily Recommend further evaluation of CT findings endoscopically via flexible sigmoidoscopy. To discuss further with Dr. Darrick Penna  NPO after midnight Will continue to follow with you   Gelene Mink, ANP-BC [facility]     LOS: 0 days    11/11/2016, 4:41 PM

## 2016-11-12 NOTE — Interval H&P Note (Signed)
History and Physical Interval Note:  11/12/2016 3:10 PM  Benjamin MessierWillie C Romick  has presented today for surgery, with the diagnosis of ABNORMAL RECTUM/SIGMOID COLONON CT  The various methods of treatment have been discussed with the patient and family. After consideration of risks, benefits and other options for treatment, the patient has consented to  Procedure(s): FLEXIBLE SIGMOIDOSCOPY (N/A) as a surgical intervention .  The patient's history has been reviewed, patient examined, no change in status, stable for surgery.  I have reviewed the patient's chart and labs.  Questions were answered to the patient's satisfaction.     [name]  Patient seen and examined. Agree with FS. Discussed risks, benefits and limitations with the patient. Questions answered; he is agreeable.

## 2016-11-13 ENCOUNTER — Telehealth: Payer: Self-pay | Admitting: Gastroenterology

## 2016-11-13 LAB — BASIC METABOLIC PANEL
Anion gap: 5 (ref 5–15)
BUN: 17 mg/dL (ref 6–20)
CALCIUM: 8.1 mg/dL — AB (ref 8.9–10.3)
CO2: 29 mmol/L (ref 22–32)
CREATININE: 1.08 mg/dL (ref 0.61–1.24)
Chloride: 103 mmol/L (ref 101–111)
GFR calc Af Amer: >60 mL/min (ref >60)
Glucose, Bld: 102 mg/dL — ABNORMAL HIGH (ref 65–99)
Potassium: 4.2 mmol/L (ref 3.5–5.1)
SODIUM: 137 mmol/L (ref 135–145)

## 2016-11-13 MED ORDER — POLYETHYLENE GLYCOL 3350 17 G PO PACK: 17.0000 g | PACK | Freq: Every day | ORAL | 0 refills | Status: DC | PRN

## 2016-11-13 MED ORDER — CIPROFLOXACIN HCL 500 MG PO TABS: 500.0000 mg | ORAL_TABLET | Freq: Two times a day (BID) | ORAL | 0 refills | Status: AC

## 2016-11-13 MED ORDER — TAMSULOSIN HCL 0.4 MG PO CAPS: 0.4000 mg | ORAL_CAPSULE | Freq: Every day | ORAL | 4 refills | Status: DC

## 2016-11-13 NOTE — Progress Notes (Signed)
REVIEWED-NO ADDITIONAL RECOMMENDATIONS.  Subjective:  No complaints. Wants to go home.   Objective: Vital signs in last 24 hours: Temp:  [97.9 F (36.6 C)-98.1 F (36.7 C)] 97.9 F (36.6 C) (03/28 2209) Pulse Rate:  [61-76] 76 (03/28 2209) Resp:  [10-18] 16 (03/28 2209) BP: (86-146)/(51-77) 117/60 (03/28 2209) SpO2:  [92 %-99 %] 99 % (03/28 2209) Weight:  [128 lb (58.1 kg)] 128 lb (58.1 kg) (03/28 1426) Last BM Date: 11/12/16 General:   Alert,  Well-developed, well-nourished, pleasant and cooperative in NAD Head:  Normocephalic and atraumatic. Eyes:  Sclera clear, no icterus.   Abdomen:  Soft, nontender and nondistended.  Foley catheter bag: gross hematuria. Psych:  Alert and cooperative. Normal mood and affect.  Intake/Output from previous day: 03/28 0701 - 03/29 0700 In: 2684.4 [P.O.:240; I.V.:2444.4] Out: 200 [Urine:200] Intake/Output this shift: No intake/output data recorded.  Lab Results: CBC  Recent Labs  11/10/16 0929 11/11/16 0511  WBC 10.1 8.3  HGB 16.0 13.9  HCT 47.7 42.2  MCV 89.7 89.0  PLT 312 249   BMET  Recent Labs  11/11/16 0511 11/12/16 0629 11/13/16 0443  NA 134* 137 137  K 3.9 3.9 4.2  CL 99* 102 103  CO2 27 28 29   GLUCOSE 95 100* 102*  BUN 25* 18 17  CREATININE 1.28* 1.15 1.08  CALCIUM 8.5* 8.4* 8.1*   LFTs No results for input(s): BILITOT, BILIDIR, IBILI, ALKPHOS, AST, ALT, PROT, ALBUMIN in the last 72 hours. No results for input(s): LIPASE in the last 72 hours. PT/INR No results for input(s): LABPROT, INR in the last 72 hours.    Imaging Studies: Ct Renal Stone Study  Result Date: 11/10/2016 CLINICAL DATA:  Lower pelvic pain, urinary retention for 2-3 days. EXAM: CT ABDOMEN AND PELVIS WITHOUT CONTRAST TECHNIQUE: Multidetector CT imaging of the abdomen and pelvis was performed following the standard protocol without IV contrast. COMPARISON:  10/31/2014 FINDINGS: Lower chest: Moderate-sized hiatal hernia present. Heart is normal  size. Lung bases clear. No effusions. Hepatobiliary: Multiple gallstones within the gallbladder. Mildly prominent common bile duct, measuring up to 10 mm, slightly larger than prior study. No visible distal ureteral stone or intrahepatic biliary duct dilatation. No focal hepatic abnormality. Pancreas: No focal abnormality or ductal dilatation. Spleen: No focal abnormality.  Normal size. Adrenals/Urinary Tract: Small low-density area posteriorly in the midpole of the right kidney, likely small cyst. This is unchanged. No hydronephrosis. No renal or ureteral stones. Foley catheter is present in the bladder. Bladder wall irregularity noted. Calcifications noted within the bladder lumen or bladder wall as seen on prior CT, not significantly changed. Adrenal glands unremarkable. Stomach/Bowel: Diffuse colonic diverticulosis. Large stool burden throughout the colon. There is mild wall thickening in the rectosigmoid colon with large amounts of stool. Mild haziness noted around the mid sigmoid colon. Abrupt termination of stool and gas in the region of the rectum. Cannot exclude rectal mass. Recommend correlation with digital rectal exam. Small bowel and stomach decompressed, unremarkable. Vascular/Lymphatic: Diffuse aortic and iliac calcifications. No aneurysm or adenopathy. Reproductive: Mild prostate enlargement. Other: No free fluid or free air. Musculoskeletal: No acute bony abnormality. Degenerative changes in the lumbar spine. IMPRESSION: Large stool burden throughout the entire colon, most pronounced in the rectosigmoid colon. There is abrupt termination of gas and stool at the region of the rectum with air soft tissue fullness. Cannot exclude soft tissue rectal mass. Recommend correlation with digital rectal exam and possible colonoscopy. There is mild wall thickening in the sigmoid colon  with surrounding inflammatory changes suggesting colitis. Diffuse colonic diverticulosis. Cholelithiasis. Mildly dilated common  bile duct without visible distal stones. Foley catheter in place within the bladder. Bladder wall wall is irregular with possible calcifications, similar to prior study. Moderate-sized hiatal hernia. Aortoiliac atherosclerosis. Electronically Signed   By: Charlett NoseKevin  Dover M.D.   On: 11/10/2016 11:47  [2 weeks]   Assessment:  83 year old very pleasant male admitted with lower abdominal pain, constipation, and CT findings as previously noted.  He has had multiple bowel movements and clinically feels improved after magnesium citrate, colace, and Miralax. He notes a cycle of taking epsom salts for constipation, followed by imodium due to diarrhea. Clinically, he is improved on admission. CT reviewed with Dr. Kearney Hardover and findings of concern for soft tissue rectal mass, mild wall thickening of sigmoid colon with surround inflammatory changes.  Last colonoscopy was in 2011 with frequent diverticula, lymphocytic colitis.  Flex sig this admission showed grossly normal rectum and patent sigmoid colon, scope advanced to 45 cm. Colonic diverticulosis seen. No mass or stricture seen.  Management of gross hematuria per attending.  Plan: 1. Recommend bowel regimen of benefiber 1 tablespoon twice daily. May use MiraLAX 1-2 times daily, titrating to achieve one bowel movement daily to every other day. 2. From a GI standpoint he is stable for discharge.  Leanna BattlesLeslie S. Dixon BoosLewis, PA-C [facility] [phone] 3/29/20188:57 AM     LOS: 2 days

## 2016-11-13 NOTE — Telephone Encounter (Signed)
Please schedule a hospital follow-up appointment with Dr. Darrick Pennafields, Tobi BastosAnna, or myself in 4-6 weeks.

## 2016-11-13 NOTE — Op Note (Signed)
NAME:  Ursula BeathSHELTON, WILLEY                   ACCOUNT NO.:  MEDICAL RECORD NO.:  [id number]  LOCATION:                                 FACILITY:  PHYSICIAN:  R. Roetta SessionsMichael Nashid Pellum, MD FACP FACGDATE OF BIRTH:  DATE OF PROCEDURE:  11/12/2016 DATE OF DISCHARGE:                              OPERATIVE REPORT   PROCEDURE PERFORMED:  Sigmoidoscopy, diagnostic.  INDICATIONS FOR PROCEDURE:  83 year old gentleman admitted to the hospital with lower abdominal pain and constipation.  CT suggested the possibility of an obstructing process and abnormal rectum/sigmoid segment.  Sigmoidoscopy now being done as a limited evaluation.  Risks, benefits, limitations, alternatives, and imponderables have been discussed with the patient previously and all parties agreeable.  PROCEDURE NOTE:  O2 saturation, blood pressure, pulse, respirations monitored throughout the entire procedure.  CONSCIOUS SEDATION:  Versed 4 mg IV and Demerol 75 mg IV in divided doses.  INSTRUMENT:  Pentax video chip system.  FINDINGS:  Digital rectal exam revealed no abnormalities.  ENDOSCOPIC FINDINGS:  The prep was poor even for sigmoidoscopy .  There was formed stool in the rectum.  I was easily able To retroflex.  I could not see all the mucosal surfaces very well. However, I did not see a mass or polyp.  I advanced the scope in a nice one-to-one fashion to approximately 45 cm.  I believe the sigmoid segment was seen.  The patient had occasional diverticula; however, this segment of the GI tract was widely patent.  Please see photos.  A Smaller / flat lesion may have been easily obscured today due to the poor prep.  However, this segment of the GI tract is widely patent.  The patient tolerated the procedure well.  SERVICE TIME:  16 minutes.  IMPRESSION:  Grossly normal rectum and patent sigmoid; colonic diverticulosis.  Limited examination as described.  RECOMMENDATIONS: 1. Bowel regimen including Benefiber 1 tablespoon  twice daily. 2. MiraLAX 1 to 2 times daily, titrating to achieve 1 bowel movement     daily to every other day.     Jonathon Bellows. Michael Keshav Winegar, MD FACP [facility] Center[facility]AC[facility]     RMR/MEDQ  D:  11/12/2016  T:  11/12/2016  Job:  [id number]  cc:   Delbert Harnesson Diego, MD

## 2016-11-13 NOTE — Progress Notes (Signed)
Discharge instructions gone over with patient and wife, verbalized understanding. IV and foley catheter removed, patient tolerated procedures well. I have requested the patient wait to see if he can void since the removal of the foley catheter before he leaves. Patient states that he will wait to see if he can.

## 2016-11-13 NOTE — Progress Notes (Signed)
Discussed patient in [id number] with MD this AM. Reported to MD that patient was found to have bloody urine this AM. This is a new finding for me since having this patient yesterday. MD reports that he is sure it is trauma from the catheter, but that he will investigate further when he comes to see the patient.

## 2016-11-13 NOTE — Discharge Summary (Signed)
Physician Discharge Summary  EARLY STEEL ZOX:[id number] DOB: [birth year] DOA: 11/10/2016  PCP: Isabella Stalling, MD  Admit date: 11/10/2016 Discharge date: 11/13/2016   Recommendations for Outpatient Follow-up:   patiet is advised totke Cipo50mg  by mouth twice a dayfor 5 days due  Hematuria from [facility] [place]. He slikewie asked t ake flmax 0.4 m bymouth daily indfinitly rir reteti and o tke marrw lcs17 g by mouth daily when necessaryfocontptin Discharge Diagnoses:  Principal Problem:   Constipation Active Problems:   Hypothyroidism   Acute lower UTI   Acute renal failure (ARF) (HCC)   Thrush   Abnormal finding on GI tract imaging   Discharge Condition:  god  Filed Weights   11/10/16 0925 11/10/16 1754 11/12/16 1426  Weight: 59 kg (130 lb) 58.4 kg (128 lb 12.8 oz) 58.1 kg (128 lb)    History of present illness:   Benjamin [name] is an 83 year old white male with ythyriism and less activity he was admitd with hronionstipation he id notremmber o ad aolnocopy in 09. Seen inconsultation by astrntrology with some concerns fr a rectlmass baed on cT evence flexible sigmoidosop t 45 c rvels no vidence of smod oeal mass and soemil divticulsis only he lkeehad some urinary retention upon admissin statspost tURP Flomax was added forthis isu .4 daily he was old to take me relax1 gb mouth daily when necessry fr costpaio nncreaephaliviy his thyrodfnctionserewithin noral parameters  Hospital Course:    Procedures:  Flexible sigmoidoscopy  Consultations:  flexible sigmoidoscopygastroenterology  Discharge Instructions  Discharge Instructions    Discharge instructions    Complete by:  As directed    Discharge patient    Complete by:  As directed    Discharge disposition:  01-Home or Self Care   Discharge patient date:  11/13/2016     Allergies as of 11/13/2016   No Known Allergies     Medication List    TAKE these medications   ciprofloxacin 500 MG tablet Commonly known as:   CIPRO Take 1 tablet (500 mg total) by mouth 2 (two) times daily.   levothyroxine 100 MCG tablet Commonly known as:  SYNTHROID, LEVOTHROID Take 100 mcg by mouth daily.   polyethylene glycol packet Commonly known as:  MIRALAX / GLYCOLAX Take 17 g by mouth daily as needed for mild constipation.   pravastatin 40 MG tablet Commonly known as:  PRAVACHOL Take 40 mg by mouth daily.   tamsulosin 0.4 MG Caps capsule Commonly known as:  FLOMAX Take 1 capsule (0.4 mg total) by mouth daily. Start taking on:  11/14/2016      No Known Allergies    The results of significant diagnostics from this hospitalization (including imaging, microbiology, ancillary and laboratory) are listed below for reference.    Significant Diagnostic Studies: Ct Renal Stone Study  Result Date: 11/10/2016 CLINICAL DATA:  Lower pelvic pain, urinary retention for 2-3 days. EXAM: CT ABDOMEN AND PELVIS WITHOUT CONTRAST TECHNIQUE: Multidetector CT imaging of the abdomen and pelvis was performed following the standard protocol without IV contrast. COMPARISON:  10/31/2014 FINDINGS: Lower chest: Moderate-sized hiatal hernia present. Heart is normal size. Lung bases clear. No effusions. Hepatobiliary: Multiple gallstones within the gallbladder. Mildly prominent common bile duct, measuring up to 10 mm, slightly larger than prior study. No visible distal ureteral stone or intrahepatic biliary duct dilatation. No focal hepatic abnormality. Pancreas: No focal abnormality or ductal dilatation. Spleen: No focal abnormality.  Normal size. Adrenals/Urinary Tract: Small low-density area posteriorly in the midpole of the right  kidney, likely small cyst. This is unchanged. No hydronephrosis. No renal or ureteral stones. Foley catheter is present in the bladder. Bladder wall irregularity noted. Calcifications noted within the bladder lumen or bladder wall as seen on prior CT, not significantly changed. Adrenal glands unremarkable. Stomach/Bowel:  Diffuse colonic diverticulosis. Large stool burden throughout the colon. There is mild wall thickening in the rectosigmoid colon with large amounts of stool. Mild haziness noted around the mid sigmoid colon. Abrupt termination of stool and gas in the region of the rectum. Cannot exclude rectal mass. Recommend correlation with digital rectal exam. Small bowel and stomach decompressed, unremarkable. Vascular/Lymphatic: Diffuse aortic and iliac calcifications. No aneurysm or adenopathy. Reproductive: Mild prostate enlargement. Other: No free fluid or free air. Musculoskeletal: No acute bony abnormality. Degenerative changes in the lumbar spine. IMPRESSION: Large stool burden throughout the entire colon, most pronounced in the rectosigmoid colon. There is abrupt termination of gas and stool at the region of the rectum with air soft tissue fullness. Cannot exclude soft tissue rectal mass. Recommend correlation with digital rectal exam and possible colonoscopy. There is mild wall thickening in the sigmoid colon with surrounding inflammatory changes suggesting colitis. Diffuse colonic diverticulosis. Cholelithiasis. Mildly dilated common bile duct without visible distal stones. Foley catheter in place within the bladder. Bladder wall wall is irregular with possible calcifications, similar to prior study. Moderate-sized hiatal hernia. Aortoiliac atherosclerosis. Electronically Signed   By: Charlett NoseKevin  Dover M.D.   On: 11/10/2016 11:47    Microbiology: Recent Results (from the past 240 hour(s))  Urine culture     Status: None   Collection Time: 11/10/16  9:29 AM  Result Value Ref Range Status   Specimen Description URINE, CATHETERIZED  Final   Special Requests NONE  Final   Culture   Final    NO GROWTH Performed at [facility], [address] [place], [place] [address]    Report Status 11/11/2016 FINAL  Final     Labs: Basic Metabolic Panel:  Recent Labs Lab 11/10/16 0929 11/11/16 0511  11/12/16 0629 11/13/16 0443  NA 134* 134* 137 137  K 4.0 3.9 3.9 4.2  CL 98* 99* 102 103  CO2 26 27 28 29   GLUCOSE 99 95 100* 102*  BUN 26* 25* 18 17  CREATININE 1.54* 1.28* 1.15 1.08  CALCIUM 9.2 8.5* 8.4* 8.1*   Liver Function Tests: No results for input(s): AST, ALT, ALKPHOS, BILITOT, PROT, ALBUMIN in the last 168 hours. No results for input(s): LIPASE, AMYLASE in the last 168 hours. No results for input(s): AMMONIA in the last 168 hours. CBC:  Recent Labs Lab 11/10/16 0929 11/11/16 0511  WBC 10.1 8.3  NEUTROABS 6.3  --   HGB 16.0 13.9  HCT 47.7 42.2  MCV 89.7 89.0  PLT 312 249   Cardiac Enzymes: No results for input(s): CKTOTAL, CKMB, CKMBINDEX, TROPONINI in the last 168 hours. BNP: BNP (last 3 results) No results for input(s): BNP in the last 8760 hours.  ProBNP (last 3 results) No results for input(s): PROBNP in the last 8760 hours.  CBG: No results for input(s): GLUCAP in the last 168 hours.     Signed:  Astria Jordahl Judie PetitM  [facility] Pager: [phone] 11/13/2016, 11:28 AM

## 2016-11-17 ENCOUNTER — Encounter: Payer: Self-pay | Admitting: Gastroenterology

## 2016-11-17 ENCOUNTER — Encounter (HOSPITAL_COMMUNITY): Payer: Self-pay | Admitting: Internal Medicine

## 2016-11-17 NOTE — Telephone Encounter (Signed)
APPT MADE AND LETTER SENT  °

## 2016-12-31 ENCOUNTER — Ambulatory Visit: Payer: Medicare Other | Admitting: Gastroenterology

## 2021-06-11 ENCOUNTER — Emergency Department (HOSPITAL_COMMUNITY)
Admission: EM | Admit: 2021-06-11 | Discharge: 2021-06-11 | Disposition: A | Discharge status: Home / Self Care | Payer: Medicare Other | Attending: Emergency Medicine | Admitting: Emergency Medicine

## 2021-06-11 ENCOUNTER — Encounter (HOSPITAL_COMMUNITY): Payer: Self-pay | Admitting: Emergency Medicine

## 2021-06-11 DIAGNOSIS — I4891 Unspecified atrial fibrillation: Secondary | ICD-10-CM | POA: Diagnosis not present

## 2021-06-11 DIAGNOSIS — E039 Hypothyroidism, unspecified: Secondary | ICD-10-CM | POA: Diagnosis not present

## 2021-06-11 DIAGNOSIS — R1030 Lower abdominal pain, unspecified: Secondary | ICD-10-CM | POA: Insufficient documentation

## 2021-06-11 DIAGNOSIS — D649 Anemia, unspecified: Secondary | ICD-10-CM | POA: Insufficient documentation

## 2021-06-11 DIAGNOSIS — R58 Hemorrhage, not elsewhere classified: Secondary | ICD-10-CM | POA: Diagnosis not present

## 2021-06-11 DIAGNOSIS — R0902 Hypoxemia: Secondary | ICD-10-CM | POA: Diagnosis not present

## 2021-06-11 DIAGNOSIS — R339 Retention of urine, unspecified: Secondary | ICD-10-CM | POA: Diagnosis not present

## 2021-06-11 DIAGNOSIS — R319 Hematuria, unspecified: Secondary | ICD-10-CM | POA: Diagnosis not present

## 2021-06-11 DIAGNOSIS — R000 Tachycardia, unspecified: Secondary | ICD-10-CM | POA: Diagnosis not present

## 2021-06-11 DIAGNOSIS — D72829 Elevated white blood cell count, unspecified: Secondary | ICD-10-CM | POA: Diagnosis not present

## 2021-06-11 LAB — COMPREHENSIVE METABOLIC PANEL
ALT: 17 U/L (ref 0–44)
AST: 18 U/L (ref 15–41)
Albumin: 3.4 g/dL — ABNORMAL LOW (ref 3.5–5.0)
Alkaline Phosphatase: 66 U/L (ref 38–126)
Anion gap: 7 (ref 5–15)
BUN: 30 mg/dL — ABNORMAL HIGH (ref 8–23)
CO2: 27 mmol/L (ref 22–32)
Calcium: 8.5 mg/dL — ABNORMAL LOW (ref 8.9–10.3)
Chloride: 103 mmol/L (ref 98–111)
Creatinine, Ser: 0.95 mg/dL (ref 0.61–1.24)
GFR, Estimated: >60 mL/min (ref >60)
Glucose, Bld: 118 mg/dL — ABNORMAL HIGH (ref 70–99)
Potassium: 4.3 mmol/L (ref 3.5–5.1)
Sodium: 137 mmol/L (ref 135–145)
Total Bilirubin: 0.6 mg/dL (ref 0.3–1.2)
Total Protein: 6.2 g/dL — ABNORMAL LOW (ref 6.5–8.1)

## 2021-06-11 LAB — CBC WITH DIFFERENTIAL/PLATELET
Abs Immature Granulocytes: 0.1 10*3/uL — ABNORMAL HIGH (ref 0.00–0.07)
Basophils Absolute: 0.1 10*3/uL (ref 0.0–0.1)
Basophils Relative: 0 %
Eosinophils Absolute: 0.2 10*3/uL (ref 0.0–0.5)
Eosinophils Relative: 1 %
HCT: 34.8 % — ABNORMAL LOW (ref 39.0–52.0)
Hemoglobin: 11.3 g/dL — ABNORMAL LOW (ref 13.0–17.0)
Immature Granulocytes: 1 %
Lymphocytes Relative: 12 %
Lymphs Abs: 1.6 10*3/uL (ref 0.7–4.0)
MCH: 31 pg (ref 26.0–34.0)
MCHC: 32.5 g/dL (ref 30.0–36.0)
MCV: 95.6 fL (ref 80.0–100.0)
Monocytes Absolute: 1.1 10*3/uL — ABNORMAL HIGH (ref 0.1–1.0)
Monocytes Relative: 8 %
Neutro Abs: 10.4 10*3/uL — ABNORMAL HIGH (ref 1.7–7.7)
Neutrophils Relative %: 78 %
Platelets: 331 10*3/uL (ref 150–400)
RBC: 3.64 MIL/uL — ABNORMAL LOW (ref 4.22–5.81)
RDW: 15.2 % (ref 11.5–15.5)
WBC: 13.5 10*3/uL — ABNORMAL HIGH (ref 4.0–10.5)
nRBC: 0 % (ref 0.0–0.2)

## 2021-06-11 LAB — URINALYSIS, ROUTINE W REFLEX MICROSCOPIC

## 2021-06-11 LAB — URINALYSIS, MICROSCOPIC (REFLEX)
RBC / HPF: >50 RBC/hpf (ref 0–5)
WBC, UA: >50 WBC/hpf (ref 0–5)

## 2021-06-11 LAB — LIPASE, BLOOD: Lipase: 29 U/L (ref 11–51)

## 2021-06-11 MED ORDER — CEPHALEXIN 500 MG PO CAPS: 500.0000 mg | ORAL_CAPSULE | Freq: Four times a day (QID) | ORAL | 0 refills | Status: AC

## 2021-06-11 MED ORDER — SODIUM CHLORIDE 0.9 % IV SOLN
1.0000 g | Freq: Once | INTRAVENOUS | Status: AC
  Administered 2021-06-11: 1 g via INTRAVENOUS
  Filled 2021-06-11: qty 10

## 2021-06-11 MED ORDER — SODIUM CHLORIDE 0.9 % IV BOLUS
1000.0000 mL | Freq: Once | INTRAVENOUS | Status: AC
  Administered 2021-06-11: 1000 mL via INTRAVENOUS

## 2021-06-11 NOTE — ED Triage Notes (Signed)
Pt brought in by EMS, pt complains of difficulty emptying bladder and hematuria since yesterday. Pt has history of difficulty urinating, pt states he has been having issues urinating for the past month. Last night is when the hematuria started and pt states he has not been able to urinate since last night. Pt complaining of pelvic pain, bladder is distended on palpation, pain with exam noted, bladder scan shows 736 cc in bladder. Pt states his pain is a constant sharp pain, level 10 on a scale of 0 to 10.

## 2021-06-11 NOTE — Discharge Instructions (Signed)
You were given a prescription for antibiotics. Please take the antibiotic prescription fully.   A culture was sent of your urine today to determine if there is any bacterial growth. If the results of the culture are positive and you require an antibiotic or a change of your prescribed antibiotic you will be contacted by the hospital. If the results are negative you will not be contacted.  Please follow up with [facility] within 5-7 days for re-evaluation of your symptoms.    Please return to the emergency department for any new or worsening symptoms.

## 2021-06-11 NOTE — ED Notes (Signed)
500 ML of bloody urine drained from cath bag.

## 2021-06-11 NOTE — ED Provider Notes (Signed)
[facility] EMERGENCY DEPARTMENT Provider Note   CSN: [id number] Arrival date & time: 06/11/21  5462     History Chief Complaint  Patient presents with   Hematuria    Benjamin [name] is a 87 y.o. male.  HPI  87 year old male with a history of hypercholesterolemia, thyroid disease, urinary retention, who presents to the emergency department today for evaluation of lower abdominal pain.  States he woke up with discomfort to the lower abdomen.  On arrival to the ED he was noted to have over 700 cc in the bladder so Foley was placed and he states that his abdominal pain had significantly improved since this was placed.  He has had dysuria for the last several weeks and started having hematuria yesterday.  Denies any flank pain, nausea vomiting or fevers.  He has had to have a Foley catheter placed many years ago for urinary retention.  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 Darrick Penna: frequent diverticula, pathology with lymphocytic colitis. Large internal hemorrhoids.    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 Authorizing Provider  cephALEXin (KEFLEX) 500 MG capsule Take 1 capsule (500 mg total) by mouth 4 (four) times daily for 7 days. 06/11/21 06/18/21 Yes Pranav Lince S, PA-C  levothyroxine (SYNTHROID, LEVOTHROID) 100 MCG tablet Take 100 mcg by mouth daily. 09/27/14   [provider]  polyethylene glycol (MIRALAX / GLYCOLAX) packet Take 17 g by mouth daily as needed for mild constipation. 11/13/16   Oval Linsey, MD  pravastatin (PRAVACHOL) 40 MG tablet Take 40 mg by mouth daily. 10/20/14   [provider]  tamsulosin (FLOMAX) 0.4 MG CAPS capsule Take 1 capsule (0.4 mg total) by mouth daily. 11/14/16   Oval Linsey, MD    Allergies    Patient has no known allergies.  Review of Systems   Review  of Systems  Constitutional:  Negative for chills and fever.  HENT:  Negative for ear pain and sore throat.   Eyes:  Negative for visual disturbance.  Respiratory:  Negative for cough and shortness of breath.   Cardiovascular:  Negative for chest pain.  Gastrointestinal:  Positive for abdominal pain and nausea. Negative for constipation, diarrhea and vomiting.  Genitourinary:  Positive for dysuria and hematuria.  Musculoskeletal:  Negative for back pain.  Skin:  Negative for rash.  Neurological:   Negative for seizures and syncope.  All other systems reviewed and are negative.  Physical Exam Updated Vital Signs BP (!) 125/58   Pulse 81   Temp 98.5 F (36.9 C) (Oral)   Resp 18   Ht 5\' 10"  (1.778 m)   Wt 58.1 kg   SpO2 99%   BMI 18.37 kg/m   Physical Exam Vitals and nursing note reviewed.  Constitutional:      Appearance: He is well-developed.  HENT:     Head: Normocephalic and atraumatic.  Eyes:     Conjunctiva/sclera: Conjunctivae normal.  Cardiovascular:     Rate and Rhythm: Normal rate and regular rhythm.     Heart sounds: Normal heart sounds. No murmur heard. Pulmonary:     Effort: Pulmonary effort is normal. No respiratory distress.     Breath sounds: Normal breath sounds. No wheezing, rhonchi or rales.  Abdominal:     General: Bowel sounds are normal.     Palpations: Abdomen is soft.     Tenderness: There is abdominal tenderness (mild suprapubic ttp).  Musculoskeletal:     Cervical back: Neck supple.  Skin:    General: Skin is warm and dry.  Neurological:     Mental Status: He is alert.    ED Results / Procedures / Treatments   Labs (all labs ordered are listed, but only abnormal results are displayed) Labs Reviewed  CBC WITH DIFFERENTIAL/PLATELET - Abnormal; Notable for the following components:      Result Value   WBC 13.5 (*)    RBC 3.64 (*)    Hemoglobin 11.3 (*)    HCT 34.8 (*)    Neutro Abs 10.4 (*)    Monocytes Absolute 1.1 (*)    Abs Immature Granulocytes 0.10 (*)    All other components within normal limits  COMPREHENSIVE METABOLIC PANEL - Abnormal; Notable for the following components:   Glucose, Bld 118 (*)    BUN 30 (*)    Calcium 8.5 (*)    Total Protein 6.2 (*)    Albumin 3.4 (*)    All other components within normal limits  URINALYSIS, ROUTINE W REFLEX MICROSCOPIC - Abnormal; Notable for the following components:   Color, Urine RED (*)    APPearance TURBID (*)    Glucose, UA   (*)    Value: TEST NOT REPORTED DUE TO COLOR  INTERFERENCE OF URINE PIGMENT   Hgb urine dipstick   (*)    Value: TEST NOT REPORTED DUE TO COLOR INTERFERENCE OF URINE PIGMENT   Bilirubin Urine   (*)    Value: TEST NOT REPORTED DUE TO COLOR INTERFERENCE OF URINE PIGMENT   Ketones, ur   (*)    Value: TEST NOT REPORTED DUE TO COLOR INTERFERENCE OF URINE PIGMENT   Protein, ur   (*)    Value: TEST NOT REPORTED DUE TO COLOR INTERFERENCE OF URINE PIGMENT   Nitrite   (*)    Value: TEST NOT REPORTED DUE TO COLOR INTERFERENCE OF URINE  PIGMENT   Leukocytes,Ua   (*)    Value: TEST NOT REPORTED DUE TO COLOR INTERFERENCE OF URINE PIGMENT   All other components within normal limits  URINALYSIS, MICROSCOPIC (REFLEX) - Abnormal; Notable for the following components:   Bacteria, UA MANY (*)    All other components within normal limits  URINE CULTURE  LIPASE, BLOOD    EKG None  Radiology No results found.  Procedures Procedures   Medications Ordered in ED Medications  sodium chloride 0.9 % bolus 1,000 mL (0 mLs Intravenous Stopped 06/11/21 1300)  cefTRIAXone (ROCEPHIN) 1 g in sodium chloride 0.9 % 100 mL IVPB (0 g Intravenous Stopped 06/11/21 1328)    ED Course  I have reviewed the triage vital signs and the nursing notes.  Pertinent labs & imaging results that were available during my care of the patient were reviewed by me and considered in my medical decision making (see chart for details).    MDM Rules/Calculators/A&P                          87 y/o M presenting for eval of lower abd pain and hematuria. Found to have urinary retention. Foley placed and pt had improvement of symptoms.   Reviewed/interpreted labs CBC with mild leukocytosis, mild anemia CMP with mildly elevated bun/ normal cr. Otherwise reassuring Lipase neg UA with gross hematuria, unable to be interpreted. On microscopic there are >50 RBC and WBCs and many bacteria. Urine culture obtained and ceftriaxone given based on presumed UTI.   A foley was placed in  the ED and pt had about 750 cc of output or gross hematuria.  His symptoms completely resolved after the Foley was placed.  His urine is most likely infected and he was given ceftriaxone here in the ED for treatment of urinary tract infection.  His urine was cultured and we will send him home on Keflex.  He is advised to follow-up with [facility] whom he is currently established with.  He is advised to return to ED for any new or worsening symptoms.  All questions answered.  Patient stable for discharge.   Final Clinical Impression(s) / ED Diagnoses Final diagnoses:  Hematuria, unspecified type    Rx / DC Orders ED Discharge Orders          Ordered    cephALEXin (KEFLEX) 500 MG capsule  4 times daily        06/11/21 [id number], Nathaneil Feagans S, PA-C 06/11/21 1514    Rozelle Logan, DO 06/12/21 1610

## 2021-06-13 LAB — URINE CULTURE: Culture: >=100000 — AB

## 2021-06-20 ENCOUNTER — Ambulatory Visit: Payer: Medicare Other | Admitting: Urology

## 2021-06-20 NOTE — Progress Notes (Deleted)
   Assessment: No diagnosis found.   Plan: ***  Chief Complaint: No chief complaint on file.   History of Present Illness:  Benjamin [name] is a 87 y.o. year old male who is seen in consultation from Pcp, No  for evaluation of ***.   Past Medical History:  Past Medical History:  Diagnosis Date   Hypercholesteremia    Thyroid disease    Urinary retention     Past Surgical History:  Past Surgical History:  Procedure Laterality Date   BACK SURGERY     sciatic nerve    COLONOSCOPY  10/2009   Dr. Darrick Penna: frequent diverticula, pathology with lymphocytic colitis. Large internal hemorrhoids.    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   Procedure: CYSTO, BLADDER BIOPSY/CLOT EVACUATION, , BILATERAL RETROGRADE PYELOGRAM,BILATERAL URETERAL STENT PLACEMENT;  Surgeon: Sebastian Ache, MD;  Location: [facility];  Service: Urology;  Laterality: Bilateral;    Allergies:  No Known Allergies  Family History:  Family History  Problem Relation Age of Onset   Colon cancer Neg Hx     Social History:  Social History   Tobacco Use   Smoking status: Never   Smokeless tobacco: Never  Substance Use Topics   Alcohol use: No   Drug use: No    Review of symptoms:  Constitutional:  Negative for unexplained weight loss, night sweats, fever, chills ENT:  Negative for nose bleeds, sinus pain, painful swallowing CV:  Negative for chest pain, shortness of breath, exercise intolerance, palpitations, loss of consciousness Resp:  Negative for cough, wheezing, shortness of breath GI:  Negative for nausea, vomiting, diarrhea, bloody stools GU:  Positives noted in HPI; otherwise negative for gross hematuria, dysuria, urinary incontinence Neuro:  Negative for seizures, poor balance, limb weakness, slurred  speech Psych:  Negative for lack of energy, depression, anxiety Endocrine:  Negative for polydipsia, polyuria, symptoms of hypoglycemia (dizziness, hunger, sweating) Hematologic:  Negative for anemia, purpura, petechia, prolonged or excessive bleeding, use of anticoagulants  Allergic:  Negative for difficulty breathing or choking as a result of exposure to anything; no shellfish allergy; no allergic response (rash/itch) to materials, foods  Physical exam: There were no vitals taken for this visit. GENERAL APPEARANCE:  Well appearing, well developed, well nourished, NAD HEENT: Atraumatic, Normocephalic, oropharynx clear. NECK: Supple without lymphadenopathy or thyromegaly. LUNGS: Clear to auscultation bilaterally. HEART: Regular Rate and Rhythm without murmurs, gallops, or rubs. ABDOMEN: Soft, non-tender, No Masses. EXTREMITIES: Moves all extremities well.  Without clubbing, cyanosis, or edema. NEUROLOGIC:  Alert and oriented x 3, normal gait, CN II-XII grossly intact.  MENTAL STATUS:  Appropriate. BACK:  Non-tender to palpation.  No CVAT SKIN:  Warm, dry and intact.    Results: No results found for this or any previous visit (from the past 24 hour(s)).

## 2021-06-26 ENCOUNTER — Ambulatory Visit: Payer: Medicare Other | Admitting: Urology

## 2021-06-26 NOTE — Progress Notes (Deleted)
   Assessment: 1. Urinary retention     Plan: ***  Chief Complaint: No chief complaint on file.   History of Present Illness:  Benjamin [name] is a 87 y.o. year old male who is seen for evaluation of urinary retention.     Past Medical History:  Past Medical History:  Diagnosis Date   Hypercholesteremia    Thyroid disease    Urinary retention     Past Surgical History:  Past Surgical History:  Procedure Laterality Date   BACK SURGERY     sciatic nerve    COLONOSCOPY  10/2009   Dr. Darrick Penna: frequent diverticula, pathology with lymphocytic colitis. Large internal hemorrhoids.    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   Procedure: CYSTO, BLADDER BIOPSY/CLOT EVACUATION, , BILATERAL RETROGRADE PYELOGRAM,BILATERAL URETERAL STENT PLACEMENT;  Surgeon: Sebastian Ache, MD;  Location: [facility];  Service: Urology;  Laterality: Bilateral;    Allergies:  No Known Allergies  Family History:  Family History  Problem Relation Age of Onset   Colon cancer Neg Hx     Social History:  Social History   Tobacco Use   Smoking status: Never   Smokeless tobacco: Never  Substance Use Topics   Alcohol use: No   Drug use: No    Review of symptoms:  Constitutional:  Negative for unexplained weight loss, night sweats, fever, chills ENT:  Negative for nose bleeds, sinus pain, painful swallowing CV:  Negative for chest pain, shortness of breath, exercise intolerance, palpitations, loss of consciousness Resp:  Negative for cough, wheezing, shortness of breath GI:  Negative for nausea, vomiting, diarrhea, bloody stools GU:  Positives noted in HPI; otherwise negative for gross hematuria, dysuria, urinary incontinence Neuro:  Negative for seizures, poor balance, limb weakness, slurred speech Psych:   Negative for lack of energy, depression, anxiety Endocrine:  Negative for polydipsia, polyuria, symptoms of hypoglycemia (dizziness, hunger, sweating) Hematologic:  Negative for anemia, purpura, petechia, prolonged or excessive bleeding, use of anticoagulants  Allergic:  Negative for difficulty breathing or choking as a result of exposure to anything; no shellfish allergy; no allergic response (rash/itch) to materials, foods  Physical exam: There were no vitals taken for this visit. GENERAL APPEARANCE:  Well appearing, well developed, well nourished, NAD HEENT: Atraumatic, Normocephalic, oropharynx clear. NECK: Supple without lymphadenopathy or thyromegaly. LUNGS: Clear to auscultation bilaterally. HEART: Regular Rate and Rhythm without murmurs, gallops, or rubs. ABDOMEN: Soft, non-tender, No Masses. EXTREMITIES: Moves all extremities well.  Without clubbing, cyanosis, or edema. NEUROLOGIC:  Alert and oriented x 3, normal gait, CN II-XII grossly intact.  MENTAL STATUS:  Appropriate. BACK:  Non-tender to palpation.  No CVAT SKIN:  Warm, dry and intact.    Results: No results found for this or any previous visit (from the past 24 hour(s)).

## 2021-06-27 ENCOUNTER — Other Ambulatory Visit: Payer: Self-pay

## 2021-06-27 ENCOUNTER — Encounter: Payer: Self-pay | Admitting: Urology

## 2021-06-27 ENCOUNTER — Ambulatory Visit (INDEPENDENT_AMBULATORY_CARE_PROVIDER_SITE_OTHER): Payer: Medicare Other | Admitting: Urology

## 2021-06-27 VITALS — BP 139/73 | HR 73

## 2021-06-27 DIAGNOSIS — R634 Abnormal weight loss: Secondary | ICD-10-CM

## 2021-06-27 DIAGNOSIS — Z87440 Personal history of urinary (tract) infections: Secondary | ICD-10-CM | POA: Diagnosis not present

## 2021-06-27 DIAGNOSIS — R310 Gross hematuria: Secondary | ICD-10-CM

## 2021-06-27 DIAGNOSIS — R339 Retention of urine, unspecified: Secondary | ICD-10-CM | POA: Diagnosis not present

## 2021-06-27 MED ORDER — TAMSULOSIN HCL 0.4 MG PO CAPS: 0.4000 mg | ORAL_CAPSULE | Freq: Every day | ORAL | 11 refills | Status: DC

## 2021-06-27 MED ORDER — SULFAMETHOXAZOLE-TRIMETHOPRIM 800-160 MG PO TABS: 1.0000 | ORAL_TABLET | Freq: Two times a day (BID) | ORAL | 0 refills | Status: AC

## 2021-06-27 NOTE — Progress Notes (Signed)
Assessment: 1. Urinary retention   2. History of UTI   3. Gross hematuria   4. Weight loss      Plan: Foley catheter removed today  Resume tamsulosin 0.4 mg daily.  Prescription sent. Bactrim DS 1 tab twice daily x7 days Return to office in 7-10 days with bladder scan Patient advised that he will need to return to the office later today if he is unable to void.  He understands and agrees with the plan.  Chief Complaint:  Chief Complaint  Patient presents with   Urinary Retention     History of Present Illness:  Benjamin [name] is a 87 y.o. year old male who is seen for evaluation of urinary retention.  He presented to the emergency room on 06/11/2021 with lower abdominal pain.  He was noted to have 700 mL in the bladder and a Foley catheter was placed with resolution of his abdominal pain.  He reported dysuria and gross hematuria for 1-2 days prior to presentation.  He denies any significant urinary symptoms prior to this episode.  Urine culture grew >100 K E. coli.  He was treated with Rocephin and placed on cephalexin.  He has a prior history of hematuria with clot retention and March 2016.  He underwent cystoscopy with clot evacuation, fulguration, and bladder biopsy in March 2016.  Pathology showed no evidence of malignancy.  He is status post a TURP in 2010.  Pathology was benign. He was previously on tamsulosin but has not taken the medication for approximately 1 year. He reports a 20 pound weight loss within the past month.  He is not having any flank or abdominal pain. His Foley catheter has been draining well.  No further gross hematuria.  Labs from 06/11/2021 showed an elevated white count of 13.5, creatinine 0.95, normal LFTs.  Past Medical History:  Past Medical History:  Diagnosis Date   Hypercholesteremia    Thyroid disease    Urinary retention     Past Surgical History:  Past Surgical History:  Procedure Laterality Date   BACK SURGERY     sciatic  nerve    COLONOSCOPY  10/2009   Dr. Darrick Penna: frequent diverticula, pathology with lymphocytic colitis. Large internal hemorrhoids.    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   Procedure: CYSTO, BLADDER BIOPSY/CLOT EVACUATION, , BILATERAL RETROGRADE PYELOGRAM,BILATERAL URETERAL STENT PLACEMENT;  Surgeon: Sebastian Ache, MD;  Location: [facility];  Service: Urology;  Laterality: Bilateral;    Allergies:  No Known Allergies  Family History:  Family History  Problem Relation Age of Onset   Colon cancer Neg Hx     Social History:  Social History   Tobacco Use   Smoking status: Never   Smokeless tobacco: Never  Substance Use Topics   Alcohol use: No   Drug use: No    Review of symptoms:  Constitutional:  Negative for night sweats, fever, chills; + weight loss ENT:  Negative for nose bleeds, sinus pain, painful swallowing CV:  Negative for chest pain, shortness of breath, exercise intolerance, palpitations, loss of consciousness Resp:  Negative for cough, wheezing, shortness of breath GI:  Negative for nausea, vomiting, diarrhea, bloody stools GU:  Positives noted in HPI; otherwise negative for  urinary incontinence; + dysuria, gross hematuria Neuro:  Negative for seizures, poor balance, limb weakness, slurred  speech Psych:  Negative for lack of energy, depression, anxiety Endocrine:  Negative for polydipsia, polyuria, symptoms of hypoglycemia (dizziness, hunger, sweating) Hematologic:  Negative for anemia, purpura, petechia, prolonged or excessive bleeding, use of anticoagulants  Allergic:  Negative for difficulty breathing or choking as a result of exposure to anything; no shellfish allergy; no allergic response (rash/itch) to materials, foods  Physical exam: BP 139/73   Pulse 73  GENERAL  APPEARANCE:  Well appearing, well developed, well nourished, NAD HEENT: Atraumatic, Normocephalic, oropharynx clear. NECK: Supple without lymphadenopathy or thyromegaly. LUNGS: Clear to auscultation bilaterally. HEART: Regular Rate and Rhythm without murmurs, gallops, or rubs. ABDOMEN: Soft, non-tender, No Masses. EXTREMITIES: Moves all extremities well.  Without clubbing, cyanosis, or edema. NEUROLOGIC:  Alert and oriented x 3, normal gait, CN II-XII grossly intact.  MENTAL STATUS:  Appropriate. BACK:  Non-tender to palpation.  No CVAT SKIN:  Warm, dry and intact.  GU: Penis:  uncircumcised Meatus: foley in place draining clear urine Scrotum: normal, no masses Testis: normal without masses bilateral Epididymis: normal    Results: No specimen obtained  Voiding trial: Volume instilled: 500 ml Volume voided: 150 ml

## 2021-06-27 NOTE — Progress Notes (Signed)
Fill and Pull Catheter Removal  Patient is present today for a catheter removal.  Patient was cleaned and prepped in a sterile fashion of sterile water/ saline was instilled into the bladder when the patient felt the urge to urinate. 41ml of water was then drained from the balloon.  A 16FR foley cath was removed from the bladder no complications were noted .  Patient was then given some time to void on their own.  Patient can void  on their own after some time.  Patient tolerated well.  Performed by: Haiden Rawlinson LPN  Follow up/ Additional notes: Per MD note

## 2021-06-27 NOTE — Progress Notes (Signed)
Urological Symptom Review  Patient is experiencing the following symptoms: Burning/pain with urination Get up at night to urinate Blood in urine   Review of Systems  Gastrointestinal (upper)  : Nausea  Gastrointestinal (lower) : Diarrhea  Constitutional : Weight loss  Skin: Itching  Eyes: Negative for eye symptoms  Ear/Nose/Throat : Negative for Ear/Nose/Throat symptoms  Hematologic/Lymphatic: Negative for Hematologic/Lymphatic symptoms  Cardiovascular : Negative for cardiovascular symptoms  Respiratory : Cough  Endocrine: Negative for endocrine symptoms  Musculoskeletal: Negative for musculoskeletal symptoms  Neurological: Negative for neurological symptoms  Psychologic: Negative for psychiatric symptoms

## 2021-07-09 ENCOUNTER — Ambulatory Visit (INDEPENDENT_AMBULATORY_CARE_PROVIDER_SITE_OTHER): Payer: Medicare Other | Admitting: Urology

## 2021-07-09 ENCOUNTER — Encounter: Payer: Self-pay | Admitting: Urology

## 2021-07-09 ENCOUNTER — Other Ambulatory Visit: Payer: Self-pay

## 2021-07-09 VITALS — BP 146/66 | HR 69 | Wt 111.2 lb

## 2021-07-09 DIAGNOSIS — Z87440 Personal history of urinary (tract) infections: Secondary | ICD-10-CM | POA: Insufficient documentation

## 2021-07-09 DIAGNOSIS — R339 Retention of urine, unspecified: Secondary | ICD-10-CM | POA: Insufficient documentation

## 2021-07-09 DIAGNOSIS — N138 Other obstructive and reflux uropathy: Secondary | ICD-10-CM | POA: Diagnosis not present

## 2021-07-09 DIAGNOSIS — N401 Enlarged prostate with lower urinary tract symptoms: Secondary | ICD-10-CM

## 2021-07-09 DIAGNOSIS — R8290 Unspecified abnormal findings in urine: Secondary | ICD-10-CM

## 2021-07-09 LAB — URINALYSIS, ROUTINE W REFLEX MICROSCOPIC
Bilirubin, UA: NEGATIVE
Glucose, UA: NEGATIVE
Ketones, UA: NEGATIVE
Nitrite, UA: NEGATIVE
Specific Gravity, UA: 1.025 (ref 1.005–1.030)
Urobilinogen, Ur: 0.2 mg/dL (ref 0.2–1.0)
pH, UA: 5.5 (ref 5.0–7.5)

## 2021-07-09 LAB — MICROSCOPIC EXAMINATION
Epithelial Cells (non renal): NONE SEEN /hpf (ref 0–10)
Renal Epithel, UA: NONE SEEN /hpf
WBC, UA: >30 /hpf — AB (ref 0–5)

## 2021-07-09 LAB — URINE CULTURE: PVR: 733 WU

## 2021-07-09 LAB — BLADDER SCAN AMB NON-IMAGING: PVR: 806 WU

## 2021-07-09 MED ORDER — CEFDINIR 300 MG PO CAPS: 300.0000 mg | ORAL_CAPSULE | Freq: Two times a day (BID) | ORAL | 0 refills | Status: AC

## 2021-07-09 MED ORDER — TAMSULOSIN HCL 0.4 MG PO CAPS: 0.4000 mg | ORAL_CAPSULE | Freq: Two times a day (BID) | ORAL | 11 refills | Status: DC

## 2021-07-09 NOTE — Progress Notes (Signed)
Assessment: 1. Urinary retention   2. BPH with obstruction/lower urinary tract symptoms   3. History of UTI   4. Abnormal urine findings     Plan: Urine culture sent today I discussed placing a Foley catheter.  He would like to try other options.  I did advise him that he may need to return to the office or the emergency room if he is unable to void. Begin Cefdinir 300 mg BID x 7 days Increase tamsulosin 0.4 mg to BID Return to office in 7-10 days with bladder scan  Chief Complaint:  Chief Complaint  Patient presents with   Urinary Retention    History of Present Illness:  Benjamin [name] is a 87 y.o. year old male who is seen for further evaluation of urinary retention.  He presented to the emergency room on 06/11/2021 with lower abdominal pain.  He was noted to have 700 mL in the bladder and a Foley catheter was placed with resolution of his abdominal pain.  He reported dysuria and gross hematuria for 1-2 days prior to presentation.  He denies any significant urinary symptoms prior to this episode.  Urine culture grew >100 K E. coli.  He was treated with Rocephin and placed on cephalexin.  He has a prior history of hematuria with clot retention and March 2016.  He underwent cystoscopy with clot evacuation, fulguration, and bladder biopsy in March 2016.  Pathology showed no evidence of malignancy.  He is status post a TURP in 2010.  Pathology was benign. He was previously on tamsulosin but has not taken the medication for approximately 1 year. He reports a 20 pound weight loss within the past month.  He is not having any flank or abdominal pain.  Labs from 06/11/2021 showed an elevated white count of 13.5, creatinine 0.95, normal LFTs.  His Foley was removed on 06/27/2021 after a voiding trial.  He was continued on tamsulosin 0.4 mg daily.  He was given Bactrim DS x7 days.  He returns today for follow-up.  He continues on tamsulosin 0.4 mg daily.  He reports that he has been  voiding without difficulty.  No dysuria or gross hematuria.  Portions of the above documentation were copied from a prior visit for review purposes only.   Past Medical History:  Past Medical History:  Diagnosis Date   Hypercholesteremia    Thyroid disease    Urinary retention     Past Surgical History:  Past Surgical History:  Procedure Laterality Date   BACK SURGERY     sciatic nerve    COLONOSCOPY  10/2009   Dr. Oneida Alar: frequent diverticula, pathology with lymphocytic colitis. Large internal hemorrhoids.    FLEXIBLE SIGMOIDOSCOPY N/A 11/12/2016   Procedure: FLEXIBLE SIGMOIDOSCOPY;  Surgeon: Daneil Dolin, MD;  Location: [facility];  Service: Endoscopy;  Laterality: N/A;   TRANSURETHRAL RESECTION OF BLADDER TUMOR Bilateral 10/31/2014   Procedure: CYSTO, BLADDER BIOPSY/CLOT EVACUATION, , BILATERAL RETROGRADE PYELOGRAM,BILATERAL URETERAL STENT PLACEMENT;  Surgeon: Alexis Frock, MD;  Location: [facility];  Service: Urology;  Laterality: Bilateral;    Allergies:  No Known Allergies  Family History:  Family History  Problem Relation Age of Onset   Colon cancer Neg Hx     Social History:  Social History   Tobacco Use   Smoking status: Never   Smokeless tobacco: Never  Substance Use Topics   Alcohol use: No   Drug use: No    ROS: Constitutional:  Negative for fever, chills, weight loss  CV: Negative for chest pain, previous MI, hypertension Respiratory:  Negative for shortness of breath, wheezing, sleep apnea, frequent cough GI:  Negative for nausea, vomiting, bloody stool, GERD  Physical exam: BP (!) 146/66   Pulse 69   Wt 111 lb 3.2 oz (50.4 kg)   BMI 15.96 kg/m  GENERAL APPEARANCE:  Well appearing, well developed, well nourished, NAD HEENT:  Atraumatic, normocephalic, oropharynx clear NECK:  Supple without lymphadenopathy or thyromegaly ABDOMEN:  Soft, non-tender, no masses EXTREMITIES:  Moves all extremities well, without clubbing, cyanosis, or  edema NEUROLOGIC:  Alert and oriented x 3, normal gait, CN II-XII grossly intact MENTAL STATUS:  appropriate BACK:  Non-tender to palpation, No CVAT SKIN:  Warm, dry, and intact GU: Prostate: 40 gm, NT, slightly irregular Rectum: Normal tone,  no masses or tenderness    Results: Results for orders placed or performed in visit on 07/09/21 (from the past 24 hour(s))  Urine culture     Status: None   Collection Time: 07/09/21 12:00 AM   Specimen: Urine  Result Value Ref Range   PVR 733.0 WU  Urinalysis, Routine w reflex microscopic     Status: Abnormal   Collection Time: 07/09/21  3:01 PM  Result Value Ref Range   Specific Gravity, UA 1.025 1.005 - 1.030   pH, UA 5.5 5.0 - 7.5   Color, UA Yellow Yellow   Appearance Ur Clear Clear   Leukocytes,UA 3+ (A) Negative   Protein,UA Trace (A) Negative/Trace   Glucose, UA Negative Negative   Ketones, UA Negative Negative   RBC, UA 2+ (A) Negative   Bilirubin, UA Negative Negative   Urobilinogen, Ur 0.2 0.2 - 1.0 mg/dL   Nitrite, UA Negative Negative   Microscopic Examination See below:    Narrative   Performed at:  [address] - [facility] [address], [place], [place]  [id number] Lab Director: Chinita Pester MT, Phone:  [phone]  Microscopic Examination     Status: Abnormal   Collection Time: 07/09/21  3:01 PM   Urine  Result Value Ref Range   WBC, UA >30 (A) 0 - 5 /hpf   RBC 11-30 (A) 0 - 2 /hpf   Epithelial Cells (non renal) None seen 0 - 10 /hpf   Renal Epithel, UA None seen None seen /hpf   Bacteria, UA Many (A) None seen/Few   Narrative   Performed at:  [address] - [facility] [address], [place], [place]  [id number] Lab Director: Chinita Pester MT, Phone:  [phone]     PVR:  >806 ml

## 2021-07-09 NOTE — Progress Notes (Signed)
post void residual  =>863ml   post void residual = 733 ml

## 2021-07-09 NOTE — Progress Notes (Signed)
Urological Symptom Review  Patient is experiencing the following symptoms: Negative    Review of Systems  Gastrointestinal (upper)  : Negative for upper GI symptoms  Gastrointestinal (lower) : Negative for lower GI symptoms  Constitutional : Weight loss  Skin: Negative for skin symptoms  Eyes: Negative for eye symptoms  Ear/Nose/Throat : Negative for Ear/Nose/Throat symptoms  Hematologic/Lymphatic: Easy bruising  Cardiovascular : Chest pain Negative for cardiovascular symptoms  Respiratory : Negative for respiratory symptoms  Endocrine: Negative for endocrine symptoms  Musculoskeletal: Negative for musculoskeletal symptoms  Neurological: Negative for neurological symptoms  Psychologic: Negative for psychiatric symptoms

## 2021-07-13 LAB — SPECIMEN STATUS REPORT

## 2021-07-17 NOTE — Progress Notes (Signed)
Assessment: 1. BPH with obstruction/lower urinary tract symptoms   2. Urinary retention   3. History of UTI   4. Abnormal urine findings     Plan: Urine culture sent today Continue tamsulosin 0.4 mg BID Return to office in 1 month for cystoscopy  Chief Complaint:  Chief Complaint  Patient presents with   Urinary Retention     History of Present Illness:  Benjamin [name] is a 87 y.o. year old male who is seen for further evaluation of urinary retention.  He presented to the emergency room on 06/11/2021 with lower abdominal pain.  He was noted to have 700 mL in the bladder and a Foley catheter was placed with resolution of his abdominal pain.  He reported dysuria and gross hematuria for 1-2 days prior to presentation.  He denied any significant urinary symptoms prior to this episode.  Urine culture grew >100 K E. coli.  He was treated with Rocephin and placed on cephalexin.  He has a prior history of hematuria with clot retention and March 2016.  He underwent cystoscopy with clot evacuation, fulguration, and bladder biopsy in March 2016.  Pathology showed no evidence of malignancy.  He is status post a TURP in 2010.  Pathology was benign. He was previously on tamsulosin but had not taken the medication for approximately 1 year. He reported a 20 pound weight loss within the past month.  He is not having any flank or abdominal pain.  Labs from 06/11/2021 showed an elevated white count of 13.5, creatinine 0.95, normal LFTs.  His Foley was removed on 06/27/2021 after a voiding trial.  He was continued on tamsulosin 0.4 mg daily.  He was given Bactrim DS x7 days.  At his visit on 07/09/2021, he continued on tamsulosin 0.4 mg daily.  He reported that he was voiding without difficulty.  No dysuria or gross hematuria. PVR = 733 mL.  He was treated with cefdinir x7 days for possible infection.  His urine culture was not done due to improper unification of the sample.  His Flomax was  increased to 0.4 mg twice daily.  He returns today for follow-up.  Continues to void spontaneously.  He has noted some increased nocturia and incontinence at night.  He does have urgency.  No dysuria or gross hematuria.  He completed his antibiotics yesterday.  IPSS = 14 today.  Portions of the above documentation were copied from a prior visit for review purposes only.   Past Medical History:  Past Medical History:  Diagnosis Date   Hypercholesteremia    Thyroid disease    Urinary retention     Past Surgical History:  Past Surgical History:  Procedure Laterality Date   BACK SURGERY     sciatic nerve    COLONOSCOPY  10/2009   Dr. Oneida Alar: frequent diverticula, pathology with lymphocytic colitis. Large internal hemorrhoids.    FLEXIBLE SIGMOIDOSCOPY N/A 11/12/2016   Procedure: FLEXIBLE SIGMOIDOSCOPY;  Surgeon: Daneil Dolin, MD;  Location: [facility];  Service: Endoscopy;  Laterality: N/A;   TRANSURETHRAL RESECTION OF BLADDER TUMOR Bilateral 10/31/2014   Procedure: CYSTO, BLADDER BIOPSY/CLOT EVACUATION, , BILATERAL RETROGRADE PYELOGRAM,BILATERAL URETERAL STENT PLACEMENT;  Surgeon: Alexis Frock, MD;  Location: [facility];  Service: Urology;  Laterality: Bilateral;    Allergies:  No Known Allergies  Family History:  Family History  Problem Relation Age of Onset   Colon cancer Neg Hx     Social History:  Social History   Tobacco Use  Smoking status: Never   Smokeless tobacco: Never  Substance Use Topics   Alcohol use: No   Drug use: No   ROS: Constitutional:  Negative for fever, chills, weight loss CV: Negative for chest pain, previous MI, hypertension Respiratory:  Negative for shortness of breath, wheezing, sleep apnea, frequent cough GI:  Negative for nausea, vomiting, bloody stool, GERD  Physical exam: BP 132/65   Pulse 91  GENERAL APPEARANCE:  Well appearing, well developed, well nourished, NAD HEENT:  Atraumatic, normocephalic, oropharynx clear NECK:   Supple without lymphadenopathy or thyromegaly ABDOMEN:  Soft, non-tender, no masses EXTREMITIES:  Moves all extremities well, without clubbing, cyanosis, or edema NEUROLOGIC:  Alert and oriented x 3, normal gait, CN II-XII grossly intact MENTAL STATUS:  appropriate BACK:  Non-tender to palpation, No CVAT SKIN:  Warm, dry, and intact  Results: U/A:  >30 WBC, 11-30 RBC, mod bacteria   PVR:  >401 ml

## 2021-07-18 ENCOUNTER — Ambulatory Visit (INDEPENDENT_AMBULATORY_CARE_PROVIDER_SITE_OTHER): Payer: Medicare Other | Admitting: Urology

## 2021-07-18 ENCOUNTER — Other Ambulatory Visit: Payer: Self-pay

## 2021-07-18 ENCOUNTER — Encounter: Payer: Self-pay | Admitting: Urology

## 2021-07-18 VITALS — BP 132/65 | HR 91

## 2021-07-18 DIAGNOSIS — R339 Retention of urine, unspecified: Secondary | ICD-10-CM

## 2021-07-18 DIAGNOSIS — N138 Other obstructive and reflux uropathy: Secondary | ICD-10-CM

## 2021-07-18 DIAGNOSIS — R8290 Unspecified abnormal findings in urine: Secondary | ICD-10-CM | POA: Diagnosis not present

## 2021-07-18 DIAGNOSIS — N401 Enlarged prostate with lower urinary tract symptoms: Secondary | ICD-10-CM

## 2021-07-18 DIAGNOSIS — Z87440 Personal history of urinary (tract) infections: Secondary | ICD-10-CM | POA: Diagnosis not present

## 2021-07-18 LAB — MICROSCOPIC EXAMINATION
Renal Epithel, UA: NONE SEEN /hpf
WBC, UA: >30 /hpf — AB (ref 0–5)

## 2021-07-18 LAB — URINALYSIS, ROUTINE W REFLEX MICROSCOPIC
Bilirubin, UA: NEGATIVE
Glucose, UA: NEGATIVE
Ketones, UA: NEGATIVE
Nitrite, UA: NEGATIVE
Specific Gravity, UA: 1.025 (ref 1.005–1.030)
Urobilinogen, Ur: 0.2 mg/dL (ref 0.2–1.0)
pH, UA: 6 (ref 5.0–7.5)

## 2021-07-18 LAB — BLADDER SCAN AMB NON-IMAGING: Scan Result: 401

## 2021-07-18 NOTE — Progress Notes (Signed)
Urological Symptom Review  PVR 401  Patient is experiencing the following symptoms: Frequent urination Hard to postpone urination Get up at night to urinate Leakage of urine Trouble starting stream Have to strain to urinate Weak stream   Review of Systems  Gastrointestinal (upper)  : Negative for upper GI symptoms  Gastrointestinal (lower) : Negative for lower GI symptoms  Constitutional : Weight loss Fatigue  Skin: Itching  Eyes: Negative for eye symptoms  Ear/Nose/Throat : Negative for Ear/Nose/Throat symptoms  Hematologic/Lymphatic: Easy bruising  Cardiovascular : Negative for cardiovascular symptoms  Respiratory : Cough  Endocrine: Negative for endocrine symptoms  Musculoskeletal: Negative for musculoskeletal symptoms  Neurological: Negative for neurological symptoms  Psychologic: Negative for psychiatric symptoms

## 2021-07-23 LAB — URINE CULTURE

## 2021-07-24 ENCOUNTER — Telehealth: Payer: Self-pay

## 2021-07-24 MED ORDER — FLUCONAZOLE 100 MG PO TABS
200.0000 mg | ORAL_TABLET | Freq: Every day | ORAL | 0 refills | Status: AC
Start: 2021-07-24 — End: 2021-08-07

## 2021-07-24 NOTE — Addendum Note (Signed)
Addended by: Milderd Meager on: 07/24/2021 03:01 PM   Modules accepted: Orders

## 2021-07-24 NOTE — Telephone Encounter (Signed)
-----   Message from Milderd Meager, MD sent at 07/24/2021  3:01 PM EST ----- Please notify patient to begin fluconazole 2 tabs daily x 2 weeks for treatment of UTI.  Rx sent.

## 2021-07-24 NOTE — Telephone Encounter (Signed)
Patient called and made aware.

## 2021-08-20 ENCOUNTER — Telehealth: Payer: Self-pay

## 2021-08-20 ENCOUNTER — Other Ambulatory Visit: Payer: Medicare Other | Admitting: Urology

## 2021-08-20 DIAGNOSIS — R339 Retention of urine, unspecified: Secondary | ICD-10-CM

## 2021-08-20 NOTE — Telephone Encounter (Signed)
Patient's Wife left a voice message:  Needing to know if pt can decrease Rx.  Urinating very frequently.  Please advise.   Call back:  Wanda [phone]   Thanks, Rosey Bath

## 2021-08-20 NOTE — Progress Notes (Deleted)
Assessment: 1. BPH with obstruction/lower urinary tract symptoms   2. History of UTI   3. Incomplete bladder emptying      Plan: Urine culture sent today Continue tamsulosin 0.4 mg BID Return to office in 1 month for cystoscopy  Chief Complaint:  No chief complaint on file.   History of Present Illness:  Benjamin [name] is a 88 y.o. year old male who is seen for further evaluation of urinary retention.  He presented to the emergency room on 06/11/2021 with lower abdominal pain.  He was noted to have 700 mL in the bladder and a Foley catheter was placed with resolution of his abdominal pain.  He reported dysuria and gross hematuria for 1-2 days prior to presentation.  He denied any significant urinary symptoms prior to this episode.  Urine culture grew >100 K E. coli.  He was treated with Rocephin and placed on cephalexin.  He has a prior history of hematuria with clot retention and March 2016.  He underwent cystoscopy with clot evacuation, fulguration, and bladder biopsy in March 2016.  Pathology showed no evidence of malignancy.  He is status post a TURP in 2010.  Pathology was benign. He was previously on tamsulosin but had not taken the medication for approximately 1 year. He reported a 20 pound weight loss within the past month.  He is not having any flank or abdominal pain.  Labs from 06/11/2021 showed an elevated white count of 13.5, creatinine 0.95, normal LFTs.  His Foley was removed on 06/27/2021 after a voiding trial.  He was continued on tamsulosin 0.4 mg daily.  He was given Bactrim DS x7 days.  At his visit on 07/09/2021, he continued on tamsulosin 0.4 mg daily.  He reported that he was voiding without difficulty.  No dysuria or gross hematuria. PVR = 733 mL.  He was treated with cefdinir x7 days for possible infection.  His urine culture was not done due to improper unification of the sample.  His Flomax was increased to 0.4 mg twice daily.  He has continued to void  spontaneously.  He has noted some increased nocturia and incontinence at night.  He does have urgency.  No dysuria or gross hematuria.   IPSS = 14.  PVR = >401 ml. Urine culture from 07/18/21:  50-100K candida glabrata.  Treated with fluconazole.  He presents today for cystoscopy.  Portions of the above documentation were copied from a prior visit for review purposes only.   Past Medical History:  Past Medical History:  Diagnosis Date   Hypercholesteremia    Thyroid disease    Urinary retention     Past Surgical History:  Past Surgical History:  Procedure Laterality Date   BACK SURGERY     sciatic nerve    COLONOSCOPY  10/2009   Dr. Oneida Alar: frequent diverticula, pathology with lymphocytic colitis. Large internal hemorrhoids.    FLEXIBLE SIGMOIDOSCOPY N/A 11/12/2016   Procedure: FLEXIBLE SIGMOIDOSCOPY;  Surgeon: Daneil Dolin, MD;  Location: [facility];  Service: Endoscopy;  Laterality: N/A;   TRANSURETHRAL RESECTION OF BLADDER TUMOR Bilateral 10/31/2014   Procedure: CYSTO, BLADDER BIOPSY/CLOT EVACUATION, , BILATERAL RETROGRADE PYELOGRAM,BILATERAL URETERAL STENT PLACEMENT;  Surgeon: Alexis Frock, MD;  Location: [facility];  Service: Urology;  Laterality: Bilateral;    Allergies:  No Known Allergies  Family History:  Family History  Problem Relation Age of Onset   Colon cancer Neg Hx     Social History:  Social History   Tobacco Use  Smoking status: Never   Smokeless tobacco: Never  Substance Use Topics   Alcohol use: No   Drug use: No   ROS: Constitutional:  Negative for fever, chills, weight loss CV: Negative for chest pain, previous MI, hypertension Respiratory:  Negative for shortness of breath, wheezing, sleep apnea, frequent cough GI:  Negative for nausea, vomiting, bloody stool, GERD  Physical exam: There were no vitals taken for this visit. ***  Results: ***  Procedure:  Flexible Cystourethroscopy  Pre-operative Diagnosis: {cysto  diagnosis:26394}  Post-operative Diagnosis: {cysto diagnosis:26394}  Anesthesia:  local with lidocaine jelly  Surgical Narrative:  After appropriate informed consent was obtained, the patient was prepped and draped in the usual sterile fashion in the supine position.  He was correctly identified and the proper procedure delineated prior to proceeding.  Sterile lidocaine gel was instilled in the urethra. The flexible cystoscope was introduced without difficulty.  Findings:  Anterior urethra: {anterior urethral findings:26395}  Posterior urethra: {post urethral findings:26396}  Bladder: {bladder findings:26397}  Ureteral orifices: {Normal/Abnormal Appearance:21344::"normal"}  Additional findings:  Saline bladder wash for cytology {WAS/WAS [phone]::"was not"} performed.    The cystoscope was then removed.  The patient tolerated the procedure well.

## 2021-08-20 NOTE — Telephone Encounter (Signed)
Attempted to contact all numbers listed for patient. Left a voicemail for patients daughter to reschedule appt, due to machine being broken

## 2021-08-21 MED ORDER — TAMSULOSIN HCL 0.4 MG PO CAPS: 0.4000 mg | ORAL_CAPSULE | Freq: Every day | ORAL | 9 refills | Status: DC

## 2021-08-21 NOTE — Addendum Note (Signed)
Addended byGustavus Messing on: 08/21/2021 02:28 PM   Modules accepted: Orders

## 2021-08-21 NOTE — Telephone Encounter (Signed)
Pt was called and no vm could be left as vm box is not set up.

## 2021-08-21 NOTE — Telephone Encounter (Signed)
Wife returned call and states patient takes medication bid per prescription.  Shela Commons [place] PA made aware and states its ok to decrease to qd.  Wife made aware.  New rx sent to reflect medication change.  Wife states she will call back if symptoms continue.

## 2021-08-21 NOTE — Telephone Encounter (Signed)
Pt asking if he can decrease rx due to frequent urination. Please advise.  Thanks!

## 2021-09-12 ENCOUNTER — Encounter: Payer: Self-pay | Admitting: Urology

## 2021-09-12 ENCOUNTER — Ambulatory Visit (INDEPENDENT_AMBULATORY_CARE_PROVIDER_SITE_OTHER): Payer: Medicare Other | Admitting: Urology

## 2021-09-12 ENCOUNTER — Other Ambulatory Visit: Payer: Self-pay

## 2021-09-12 VITALS — BP 151/73 | HR 93

## 2021-09-12 DIAGNOSIS — R8290 Unspecified abnormal findings in urine: Secondary | ICD-10-CM

## 2021-09-12 DIAGNOSIS — N401 Enlarged prostate with lower urinary tract symptoms: Secondary | ICD-10-CM | POA: Diagnosis not present

## 2021-09-12 DIAGNOSIS — N138 Other obstructive and reflux uropathy: Secondary | ICD-10-CM | POA: Diagnosis not present

## 2021-09-12 DIAGNOSIS — Z87440 Personal history of urinary (tract) infections: Secondary | ICD-10-CM

## 2021-09-12 DIAGNOSIS — R339 Retention of urine, unspecified: Secondary | ICD-10-CM

## 2021-09-12 DIAGNOSIS — Z87898 Personal history of other specified conditions: Secondary | ICD-10-CM | POA: Diagnosis not present

## 2021-09-12 LAB — URINALYSIS, ROUTINE W REFLEX MICROSCOPIC
Bilirubin, UA: NEGATIVE
Glucose, UA: NEGATIVE
Ketones, UA: NEGATIVE
Nitrite, UA: NEGATIVE
Specific Gravity, UA: 1.02 (ref 1.005–1.030)
Urobilinogen, Ur: 0.2 mg/dL (ref 0.2–1.0)
pH, UA: 7.5 (ref 5.0–7.5)

## 2021-09-12 LAB — MICROSCOPIC EXAMINATION
Renal Epithel, UA: NONE SEEN /hpf
WBC, UA: >30 /hpf — AB (ref 0–5)

## 2021-09-12 MED ORDER — CEFDINIR 300 MG PO CAPS: 300.0000 mg | ORAL_CAPSULE | Freq: Two times a day (BID) | ORAL | 0 refills | Status: DC

## 2021-09-12 MED ORDER — CIPROFLOXACIN HCL 500 MG PO TABS
500.0000 mg | ORAL_TABLET | Freq: Once | ORAL | Status: AC
  Administered 2021-09-12: 500 mg via ORAL

## 2021-09-12 NOTE — Progress Notes (Addendum)
Assessment: 1. Incomplete bladder emptying   2. History of UTI   3. BPH with obstruction/lower urinary tract symptoms   4. History of urinary retention   5. Abnormal urine findings      Plan: Urine culture sent today Continue tamsulosin 0.4 mg daily Cefdinir 300 mg BID x 7 days. Rx sent. Urine cytology sent today Return to office in 1 month  Consider urodynamics for evaluation of incomplete emptying  Chief Complaint:  Chief Complaint  Patient presents with   Benign Prostatic Hypertrophy    History of Present Illness:  DREAN [name] is a 88 y.o. year old male who is seen for further evaluation of BPH with obstruction, history of urinary retention, and UTI's.   He presented to the emergency room on 06/11/2021 with lower abdominal pain.  He was noted to have 700 mL in the bladder and a Foley catheter was placed with resolution of his abdominal pain.  He reported dysuria and gross hematuria for 1-2 days prior to presentation.  He denied any significant urinary symptoms prior to this episode.  Urine culture grew >100 K E. coli.  He was treated with Rocephin and placed on cephalexin.  He has a prior history of hematuria with clot retention and March 2016.  He underwent cystoscopy with clot evacuation, fulguration, and bladder biopsy in March 2016.  Pathology showed no evidence of malignancy.  He is status post a TURP in 2010.  Pathology was benign. He was previously on tamsulosin but had not taken the medication for approximately 1 year. He reported a 20 pound weight loss within the past month.  No flank or abdominal pain.  Labs from 06/11/2021 showed Benjamin elevated white count of 13.5, creatinine 0.95, normal LFTs.  His Foley was removed on 06/27/2021 after a voiding trial.  He was continued on tamsulosin 0.4 mg daily.  He was given Bactrim DS x7 days.  At his visit on 07/09/2021, he continued on tamsulosin 0.4 mg daily.  He reported that he was voiding without difficulty.  No  dysuria or gross hematuria. PVR = 733 mL.  He was treated with cefdinir x7 days for possible infection.  His urine culture was not done due to improper identification of the sample.  His Flomax was increased to 0.4 mg twice daily.  He continued to void spontaneously with some increased nocturia and incontinence at night as well as urgency.  No dysuria or gross hematuria.   IPSS = 14.  PVR = >401 ml. Urine culture from 07/18/21:  50-100K candida glabrata.  Treated with fluconazole.  He presents today for cystoscopy.  He continues on tamsulosin 0.4 mg daily.  He reports that he is voiding without difficulty.  No dysuria or gross hematuria. IPSS = 8 today  Portions of the above documentation were copied from a prior visit for review purposes only.   Past Medical History:  Past Medical History:  Diagnosis Date   Hypercholesteremia    Thyroid disease    Urinary retention     Past Surgical History:  Past Surgical History:  Procedure Laterality Date   BACK SURGERY     sciatic nerve    COLONOSCOPY  10/2009   Dr. Oneida Alar: frequent diverticula, pathology with lymphocytic colitis. Large internal hemorrhoids.    FLEXIBLE SIGMOIDOSCOPY N/A 11/12/2016   Procedure: FLEXIBLE SIGMOIDOSCOPY;  Surgeon: Daneil Dolin, MD;  Location: [facility];  Service: Endoscopy;  Laterality: N/A;   TRANSURETHRAL RESECTION OF BLADDER TUMOR Bilateral 10/31/2014   Procedure:  CYSTO, BLADDER BIOPSY/CLOT EVACUATION, , BILATERAL RETROGRADE PYELOGRAM,BILATERAL URETERAL STENT PLACEMENT;  Surgeon: Alexis Frock, MD;  Location: [facility];  Service: Urology;  Laterality: Bilateral;    Allergies:  No Known Allergies  Family History:  Family History  Problem Relation Age of Onset   Colon cancer Neg Hx     Social History:  Social History   Tobacco Use   Smoking status: Never   Smokeless tobacco: Never  Substance Use Topics   Alcohol use: No   Drug use: No   ROS: Constitutional:  Negative for fever, chills,  weight loss CV: Negative for chest pain, previous MI, hypertension Respiratory:  Negative for shortness of breath, wheezing, sleep apnea, frequent cough GI:  Negative for nausea, vomiting, bloody stool, GERD  Physical exam: BP (!) 151/73    Pulse 93  GENERAL APPEARANCE:  Well appearing, well developed, well nourished, NAD HEENT:  Atraumatic, normocephalic, oropharynx clear NECK:  Supple without lymphadenopathy or thyromegaly ABDOMEN:  Soft, non-tender, no masses EXTREMITIES:  Moves all extremities well, without clubbing, cyanosis, or edema NEUROLOGIC:  Alert and oriented x 3, normal gait, CN II-XII grossly intact MENTAL STATUS:  appropriate BACK:  Non-tender to palpation, No CVAT SKIN:  Warm, dry, and intact   Results: U/A: >30 WBC, 11-30 RBC, many bacteria, nitrite negative  Procedure:  Flexible Cystourethroscopy  Pre-operative Diagnosis:  Incomplete bladder emptying, history of UTI  Post-operative Diagnosis:  Incomplete bladder emptying, history of UTI  Anesthesia:  local with lidocaine jelly  Surgical Narrative:  After appropriate informed consent was obtained, the patient was prepped and draped in the usual sterile fashion in the supine position.  He was correctly identified and the proper procedure delineated prior to proceeding.  Sterile lidocaine gel was instilled in the urethra. The flexible cystoscope was introduced without difficulty.  Findings:  Anterior urethra: Normal  Posterior urethra: Post TUR changes, minimal anterior prostate tissue without obvious obstruction  Bladder:  large capacity bladder, debris at bladder base limiting visualization; diffuse erythema, trabeculations  Ureteral orifices: not visualized  Additional findings: none  Saline bladder wash for cytology was performed.    The cystoscope was then removed.  The patient tolerated the procedure well.

## 2021-09-12 NOTE — Addendum Note (Signed)
Addended byGustavus Messing on: 09/12/2021 04:43 PM   Modules accepted: Orders

## 2021-09-12 NOTE — Progress Notes (Signed)
Urological Symptom Review  Patient is experiencing the following symptoms: Frequent urination Hard to postpone urination Get up at night to urinate Leakage of urine   Review of Systems  Gastrointestinal (upper)  : Nausea  Gastrointestinal (lower) : Diarrhea  Constitutional : Weight loss  Skin: Negative for skin symptoms  Eyes: Negative for eye symptoms  Ear/Nose/Throat : Negative for Ear/Nose/Throat symptoms  Hematologic/Lymphatic: Negative for Hematologic/Lymphatic symptoms  Cardiovascular : Negative for cardiovascular symptoms  Respiratory : Negative for respiratory symptoms  Endocrine: Negative for endocrine symptoms  Musculoskeletal: Negative for musculoskeletal symptoms  Neurological: Negative for neurological symptoms  Psychologic: Negative for psychiatric symptoms

## 2021-09-15 LAB — URINE CULTURE

## 2021-09-16 ENCOUNTER — Other Ambulatory Visit: Payer: Self-pay

## 2021-09-16 DIAGNOSIS — R8290 Unspecified abnormal findings in urine: Secondary | ICD-10-CM

## 2021-09-16 MED ORDER — CEFDINIR 300 MG PO CAPS: 300.0000 mg | ORAL_CAPSULE | Freq: Two times a day (BID) | ORAL | 0 refills | Status: AC

## 2021-09-17 ENCOUNTER — Telehealth: Payer: Self-pay

## 2021-09-17 MED ORDER — LEVOFLOXACIN 500 MG PO TABS: 500.0000 mg | ORAL_TABLET | Freq: Every day | ORAL | 0 refills | Status: AC

## 2021-09-17 NOTE — Telephone Encounter (Signed)
-----   Message from Milderd Meager, MD sent at 09/17/2021  7:49 AM EST ----- Please notify patient that his urine culture shows evidence of a UTI. Need to change antibiotic to Levaquin 500 mg daily.  New rx sent.  He should stop taking the Cefdinir.

## 2021-09-17 NOTE — Addendum Note (Signed)
Addended by: Milderd Meager on: 09/17/2021 07:49 AM   Modules accepted: Orders

## 2021-09-17 NOTE — Telephone Encounter (Signed)
Patients wife called and made aware.

## 2021-09-19 LAB — BLADDER CANCER FISH, MD REVIEW

## 2021-10-10 ENCOUNTER — Encounter: Payer: Self-pay | Admitting: Urology

## 2021-10-10 ENCOUNTER — Other Ambulatory Visit: Payer: Self-pay

## 2021-10-10 ENCOUNTER — Ambulatory Visit (INDEPENDENT_AMBULATORY_CARE_PROVIDER_SITE_OTHER): Payer: Medicare Other | Admitting: Urology

## 2021-10-10 VITALS — BP 158/81 | HR 73

## 2021-10-10 DIAGNOSIS — R8290 Unspecified abnormal findings in urine: Secondary | ICD-10-CM | POA: Diagnosis not present

## 2021-10-10 DIAGNOSIS — N138 Other obstructive and reflux uropathy: Secondary | ICD-10-CM

## 2021-10-10 DIAGNOSIS — Z87440 Personal history of urinary (tract) infections: Secondary | ICD-10-CM

## 2021-10-10 DIAGNOSIS — R339 Retention of urine, unspecified: Secondary | ICD-10-CM | POA: Diagnosis not present

## 2021-10-10 DIAGNOSIS — N401 Enlarged prostate with lower urinary tract symptoms: Secondary | ICD-10-CM | POA: Diagnosis not present

## 2021-10-10 LAB — URINALYSIS, ROUTINE W REFLEX MICROSCOPIC
Bilirubin, UA: NEGATIVE
Glucose, UA: NEGATIVE
Ketones, UA: NEGATIVE
Nitrite, UA: NEGATIVE
Protein,UA: NEGATIVE
Specific Gravity, UA: 1.025 (ref 1.005–1.030)
Urobilinogen, Ur: 0.2 mg/dL (ref 0.2–1.0)
pH, UA: 6 (ref 5.0–7.5)

## 2021-10-10 LAB — MICROSCOPIC EXAMINATION
Renal Epithel, UA: NONE SEEN /hpf
WBC, UA: >30 /hpf — AB (ref 0–5)

## 2021-10-10 NOTE — Progress Notes (Signed)
Assessment: 1. BPH with obstruction/lower urinary tract symptoms   2. Incomplete bladder emptying   3. History of UTI     Plan: Urine culture sent today Continue tamsulosin 0.4 mg daily Consider urodynamics for evaluation of incomplete emptying He does not wish to pursue any additional evaluation at this time.   Return to office in 3 months  Chief Complaint:  Chief Complaint  Patient presents with   Benign Prostatic Hypertrophy    History of Present Illness:  Benjamin [name] is a 88 y.o. year old male who is seen for further evaluation of BPH with obstruction, history of urinary retention, and UTI's.   He presented to the emergency room on 06/11/2021 with lower abdominal pain.  He was noted to have 700 mL in the bladder and a Foley catheter was placed with resolution of his abdominal pain.  He reported dysuria and gross hematuria for 1-2 days prior to presentation.  He denied any significant urinary symptoms prior to this episode.  Urine culture grew >100 K E. coli.  He was treated with Rocephin and placed on cephalexin.  He has a prior history of hematuria with clot retention and March 2016.  He underwent cystoscopy with clot evacuation, fulguration, and bladder biopsy in March 2016.  Pathology showed no evidence of malignancy.  He is status post a TURP in 2010.  Pathology was benign. He was previously on tamsulosin but had not taken the medication for approximately 1 year. He reported a 20 pound weight loss within the past month.  No flank or abdominal pain.  Labs from 06/11/2021 showed an elevated white count of 13.5, creatinine 0.95, normal LFTs.  His Foley was removed on 06/27/2021 after a voiding trial.  He was continued on tamsulosin 0.4 mg daily.  He was given Bactrim DS x7 days.  At his visit on 07/09/2021, he continued on tamsulosin 0.4 mg daily.  He reported that he was voiding without difficulty.  No dysuria or gross hematuria. PVR = 733 mL.  He was treated with  cefdinir x7 days for possible infection.  His urine culture was not done due to improper identification of the sample.  His Flomax was increased to 0.4 mg twice daily.  He continued to void spontaneously with some increased nocturia and incontinence at night as well as urgency.  No dysuria or gross hematuria.   IPSS = 14.  PVR = >401 ml. Urine culture from 07/18/21:  50-100K candida glabrata.  Treated with fluconazole. He was evaluated with cystoscopy in 1/23.  This showed post TURP changes in the prostatic urethra, a large capacity bladder with debris making visualization difficult, bladder trabeculations, and diffuse erythema of the bladder mucosa. Urine culture grew >100 K Enterococcus.  Treated with Levaquin.   Urine cytology showed a negative FISH.  He returns today for follow-up.  He continues on tamsulosin 0.4 mg daily.  He denies any significant lower urinary tract symptoms.  No dysuria or gross hematuria. IPSS = 3 today.  Portions of the above documentation were copied from a prior visit for review purposes only.   Past Medical History:  Past Medical History:  Diagnosis Date   Hypercholesteremia    Thyroid disease    Urinary retention     Past Surgical History:  Past Surgical History:  Procedure Laterality Date   BACK SURGERY     sciatic nerve    COLONOSCOPY  10/2009   Dr. Oneida Alar: frequent diverticula, pathology with lymphocytic colitis. Large internal hemorrhoids.  FLEXIBLE SIGMOIDOSCOPY N/A 11/12/2016   Procedure: FLEXIBLE SIGMOIDOSCOPY;  Surgeon: Daneil Dolin, MD;  Location: [facility];  Service: Endoscopy;  Laterality: N/A;   TRANSURETHRAL RESECTION OF BLADDER TUMOR Bilateral 10/31/2014   Procedure: CYSTO, BLADDER BIOPSY/CLOT EVACUATION, , BILATERAL RETROGRADE PYELOGRAM,BILATERAL URETERAL STENT PLACEMENT;  Surgeon: Alexis Frock, MD;  Location: [facility];  Service: Urology;  Laterality: Bilateral;    Allergies:  No Known Allergies  Family History:  Family  History  Problem Relation Age of Onset   Colon cancer Neg Hx     Social History:  Social History   Tobacco Use   Smoking status: Never   Smokeless tobacco: Never  Substance Use Topics   Alcohol use: No   Drug use: No   ROS: Constitutional:  Negative for fever, chills, weight loss CV: Negative for chest pain, previous MI, hypertension Respiratory:  Negative for shortness of breath, wheezing, sleep apnea, frequent cough GI:  Negative for nausea, vomiting, bloody stool, GERD  Physical exam: BP (!) 158/81    Pulse 73  GENERAL APPEARANCE:  Well appearing, well developed, well nourished, NAD HEENT:  Atraumatic, normocephalic, oropharynx clear NECK:  Supple without lymphadenopathy or thyromegaly ABDOMEN:  Soft, non-tender, no masses EXTREMITIES:  Moves all extremities well, without clubbing, cyanosis, or edema NEUROLOGIC:  Alert and oriented x 3, normal gait, CN II-XII grossly intact MENTAL STATUS:  appropriate BACK:  Non-tender to palpation, No CVAT SKIN:  Warm, dry, and intact  Results: U/A: >30 WBC, 3-10 RBC, many bacteria, nitrite negative

## 2021-10-12 LAB — URINE CULTURE

## 2021-10-15 ENCOUNTER — Telehealth: Payer: Self-pay

## 2021-10-15 MED ORDER — NITROFURANTOIN MONOHYD MACRO 100 MG PO CAPS: 100.0000 mg | ORAL_CAPSULE | Freq: Every day | ORAL | 2 refills | Status: DC

## 2021-10-15 NOTE — Telephone Encounter (Signed)
-----   Message from Milderd Meager, MD sent at 10/15/2021 10:07 AM EST ----- Please notify patient to begin Macrobid daily for UTI prevention.  Rx sent.

## 2021-10-15 NOTE — Telephone Encounter (Signed)
Patient was called- voiced understanding and has picked up prescription.

## 2021-10-15 NOTE — Addendum Note (Signed)
Addended by: Primus Bravo on: 10/15/2021 10:07 AM   Modules accepted: Orders

## 2022-02-06 ENCOUNTER — Ambulatory Visit (INDEPENDENT_AMBULATORY_CARE_PROVIDER_SITE_OTHER): Payer: Medicare Other | Admitting: Urology

## 2022-02-06 ENCOUNTER — Encounter: Payer: Self-pay | Admitting: Urology

## 2022-02-06 VITALS — BP 117/62 | HR 116

## 2022-02-06 DIAGNOSIS — N138 Other obstructive and reflux uropathy: Secondary | ICD-10-CM

## 2022-02-06 DIAGNOSIS — N401 Enlarged prostate with lower urinary tract symptoms: Secondary | ICD-10-CM | POA: Diagnosis not present

## 2022-02-06 DIAGNOSIS — R8290 Unspecified abnormal findings in urine: Secondary | ICD-10-CM

## 2022-02-06 DIAGNOSIS — R339 Retention of urine, unspecified: Secondary | ICD-10-CM

## 2022-02-06 DIAGNOSIS — Z87440 Personal history of urinary (tract) infections: Secondary | ICD-10-CM

## 2022-02-06 LAB — URINALYSIS, ROUTINE W REFLEX MICROSCOPIC
Bilirubin, UA: NEGATIVE
Glucose, UA: NEGATIVE
Ketones, UA: NEGATIVE
Nitrite, UA: NEGATIVE
Protein,UA: NEGATIVE
Specific Gravity, UA: 1.015 (ref 1.005–1.030)
Urobilinogen, Ur: 0.2 mg/dL (ref 0.2–1.0)
pH, UA: 7 (ref 5.0–7.5)

## 2022-02-06 LAB — MICROSCOPIC EXAMINATION
Renal Epithel, UA: NONE SEEN /hpf
WBC, UA: >30 /hpf — AB (ref 0–5)

## 2022-02-06 LAB — BLADDER SCAN AMB NON-IMAGING: Scan Result: 596

## 2022-02-06 NOTE — Progress Notes (Signed)
post void residual=>596

## 2022-02-06 NOTE — Progress Notes (Signed)
Assessment: 1. BPH with obstruction/lower urinary tract symptoms   2. Incomplete bladder emptying   3. History of UTI   4. Abnormal urine findings     Plan: Urine culture sent today Continue tamsulosin 0.4 mg daily BMP today I again discussed the findings of increased postvoid residual and possible causes. Discussed urodynamics for further evaluation of incomplete emptying He is not interested in pursuing any additional evaluation at this time.   Return to office in 3 months  Chief Complaint:  Chief Complaint  Patient presents with   Benign Prostatic Hypertrophy    History of Present Illness:  Benjamin [name] is a 88 y.o. year old male who is seen for further evaluation of BPH with obstruction, history of urinary retention, and UTI's.   He presented to the emergency room on 06/11/2021 with lower abdominal pain.  He was noted to have 700 mL in the bladder and a Foley catheter was placed with resolution of his abdominal pain.  He reported dysuria and gross hematuria for 1-2 days prior to presentation.  He denied any significant urinary symptoms prior to this episode.  Urine culture grew >100 K E. coli.  He was treated with Rocephin and placed on cephalexin.  He has a prior history of hematuria with clot retention and March 2016.  He underwent cystoscopy with clot evacuation, fulguration, and bladder biopsy in March 2016.  Pathology showed no evidence of malignancy.  He is status post a TURP in 2010.  Pathology was benign. He was previously on tamsulosin but had not taken the medication for approximately 1 year. He reported a 20 pound weight loss within the past month.  No flank or abdominal pain.  Labs from 06/11/2021 showed an elevated white count of 13.5, creatinine 0.95, normal LFTs.  His Foley was removed on 06/27/2021 after a voiding trial.  He was continued on tamsulosin 0.4 mg daily.  He was given Bactrim DS x7 days.  At his visit on 07/09/2021, he continued on  tamsulosin 0.4 mg daily.  He reported that he was voiding without difficulty.  No dysuria or gross hematuria. PVR = 733 mL.  He was treated with cefdinir x7 days for possible infection.  His urine culture was not done due to improper identification of the sample.  His Flomax was increased to 0.4 mg twice daily.  He continued to void spontaneously with some increased nocturia and incontinence at night as well as urgency.  No dysuria or gross hematuria.   IPSS = 14.  PVR = >401 ml. Urine culture from 07/18/21:  50-100K candida glabrata.  Treated with fluconazole. He was evaluated with cystoscopy in 1/23.  This showed post TURP changes in the prostatic urethra, a large capacity bladder with debris making visualization difficult, bladder trabeculations, and diffuse erythema of the bladder mucosa. Urine culture grew >100 K Enterococcus.  Treated with Levaquin.   Urine cytology showed a negative FISH.  At his visit in 2/23, he continued on tamsulosin 0.4 mg daily.  He denied any significant lower urinary tract symptoms.  No dysuria or gross hematuria. IPSS = 3. Urine culture from 2/23 grew 50-100 K mixed flora.  He returns today for follow-up.  He continues on tamsulosin 0.4 mg daily.  He he reports that his urinary symptoms are stable.  He feels like he is voiding without difficulty.  No dysuria or gross hematuria.  No flank pain. IPSS = 9 today.  Portions of the above documentation were copied from a prior visit  for review purposes only.   Past Medical History:  Past Medical History:  Diagnosis Date   Hypercholesteremia    Thyroid disease    Urinary retention     Past Surgical History:  Past Surgical History:  Procedure Laterality Date   BACK SURGERY     sciatic nerve    COLONOSCOPY  10/2009   Dr. Darrick Penna: frequent diverticula, pathology with lymphocytic colitis. Large internal hemorrhoids.    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   Procedure: CYSTO, BLADDER BIOPSY/CLOT EVACUATION, , BILATERAL RETROGRADE PYELOGRAM,BILATERAL URETERAL STENT PLACEMENT;  Surgeon: Sebastian Ache, MD;  Location: [facility];  Service: Urology;  Laterality: Bilateral;    Allergies:  No Known Allergies  Family History:  Family History  Problem Relation Age of Onset   Colon cancer Neg Hx     Social History:  Social History   Tobacco Use   Smoking status: Never   Smokeless tobacco: Never  Substance Use Topics   Alcohol use: No   Drug use: No   ROS: Constitutional:  Negative for fever, chills, weight loss CV: Negative for chest pain, previous MI, hypertension Respiratory:  Negative for shortness of breath, wheezing, sleep apnea, frequent cough GI:  Negative for nausea, vomiting, bloody stool, GERD  Physical exam: BP 117/62   Pulse (!) 116  GENERAL APPEARANCE:  Well appearing, well developed, well nourished, NAD HEENT:  Atraumatic, normocephalic, oropharynx clear NECK:  Supple without lymphadenopathy or thyromegaly ABDOMEN:  Soft, non-tender, no masses EXTREMITIES:  Moves all extremities well, without clubbing, cyanosis, or edema NEUROLOGIC:  Alert and oriented x 3, normal gait, CN II-XII grossly intact MENTAL STATUS:  appropriate BACK:  Non-tender to palpation, No CVAT SKIN:  Warm, dry, and intact  Results: U/A: >30 WBCs, 0-2 RBCs, many bacteria, nitrite negative  PVR = >596 mL

## 2022-02-07 LAB — BASIC METABOLIC PANEL
BUN/Creatinine Ratio: 31 — ABNORMAL HIGH (ref 10–24)
BUN: 27 mg/dL (ref 8–27)
CO2: 24 mmol/L (ref 20–29)
Calcium: 9.3 mg/dL (ref 8.6–10.2)
Chloride: 102 mmol/L (ref 96–106)
Creatinine, Ser: 0.87 mg/dL (ref 0.76–1.27)
Glucose: 121 mg/dL — ABNORMAL HIGH (ref 70–99)
Potassium: 4.6 mmol/L (ref 3.5–5.2)
Sodium: 140 mmol/L (ref 134–144)
eGFR: 84 mL/min/{1.73_m2} (ref >59)

## 2022-02-09 LAB — URINE CULTURE

## 2022-02-11 ENCOUNTER — Telehealth: Payer: Self-pay

## 2022-02-11 NOTE — Telephone Encounter (Signed)
Patients wife called advising that yall had discussed at last appt for to to start taking 1 tamsulosin (FLOMAX) 0.4 MG CAPS capsule a day rather than 2. She wanted next rx to be sent stating same.    Thank you

## 2022-02-12 ENCOUNTER — Other Ambulatory Visit: Payer: Self-pay | Admitting: Urology

## 2022-02-13 ENCOUNTER — Other Ambulatory Visit: Payer: Self-pay

## 2022-02-13 DIAGNOSIS — R339 Retention of urine, unspecified: Secondary | ICD-10-CM

## 2022-02-13 MED ORDER — TAMSULOSIN HCL 0.4 MG PO CAPS: 0.4000 mg | ORAL_CAPSULE | Freq: Every day | ORAL | 11 refills | Status: DC

## 2022-02-13 NOTE — Telephone Encounter (Signed)
Rx originally filled on 08/2021 Refilled medication for flomax 1 tablet.

## 2022-04-08 ENCOUNTER — Telehealth: Payer: Self-pay | Admitting: *Deleted

## 2022-04-08 NOTE — Patient Outreach (Signed)
  Care Coordination   04/08/2022 Name: Benjamin [name] MRN: [id number] DOB: 31-Oct-1932   Care Coordination Outreach Attempts:  An unsuccessful telephone outreach was attempted today to offer the patient information about available care coordination services as a benefit of their health plan.   Follow Up Plan:  Additional outreach attempts will be made to offer the patient care coordination information and services.   Encounter Outcome:  No Answer  Care Coordination Interventions Activated:  No   Care Coordination Interventions:  No, not indicated    Irving Shows [facility], BSN RN Case Manager [phone]

## 2022-04-11 ENCOUNTER — Telehealth: Payer: Self-pay | Admitting: *Deleted

## 2022-04-11 NOTE — Patient Outreach (Signed)
  Care Coordination   04/11/2022 Name: Benjamin [name] MRN: [id number] DOB: 1933-01-22   Care Coordination Outreach Attempts:  A second unsuccessful outreach was attempted today to offer the patient with information about available care coordination services as a benefit of their health plan.     Follow Up Plan:  Additional outreach attempts will be made to offer the patient care coordination information and services.   Encounter Outcome:  No Answer  Care Coordination Interventions Activated:  No   Care Coordination Interventions:  No, not indicated    Irving Shows [facility], BSN RN Care Coordination [phone]

## 2022-04-16 ENCOUNTER — Telehealth: Payer: Self-pay | Admitting: *Deleted

## 2022-04-16 NOTE — Patient Outreach (Signed)
  Care Coordination   04/16/2022 Name: Benjamin [name] MRN: [id number] DOB: 07-Jun-1933   Care Coordination Outreach Attempts:  A third unsuccessful outreach was attempted today to offer the patient with information about available care coordination services as a benefit of their health plan.   Follow Up Plan:  No further outreach attempts will be made at this time. We have been unable to contact the patient to offer or enroll patient in care coordination services  Encounter Outcome:  No Answer  Care Coordination Interventions Activated:  No   Care Coordination Interventions:  No, not indicated    Irving Shows [facility], BSN RN Care Coordinator [phone]

## 2022-05-08 ENCOUNTER — Ambulatory Visit (INDEPENDENT_AMBULATORY_CARE_PROVIDER_SITE_OTHER): Payer: Medicare Other | Admitting: Urology

## 2022-05-08 ENCOUNTER — Encounter: Payer: Self-pay | Admitting: Urology

## 2022-05-08 VITALS — BP 119/68 | HR 92 | Ht 67.0 in | Wt 120.0 lb

## 2022-05-08 DIAGNOSIS — N401 Enlarged prostate with lower urinary tract symptoms: Secondary | ICD-10-CM

## 2022-05-08 DIAGNOSIS — R339 Retention of urine, unspecified: Secondary | ICD-10-CM | POA: Diagnosis not present

## 2022-05-08 DIAGNOSIS — N138 Other obstructive and reflux uropathy: Secondary | ICD-10-CM | POA: Diagnosis not present

## 2022-05-08 DIAGNOSIS — Z87440 Personal history of urinary (tract) infections: Secondary | ICD-10-CM

## 2022-05-08 DIAGNOSIS — R8290 Unspecified abnormal findings in urine: Secondary | ICD-10-CM

## 2022-05-08 NOTE — Progress Notes (Signed)
Assessment: 1. Incomplete bladder emptying   2. BPH with obstruction/lower urinary tract symptoms   3. History of UTI   4. Abnormal urine findings     Plan: Resolved MDX urine culture sent today Continue tamsulosin 0.4 mg daily I again discussed the findings of increased postvoid residual and possible causes. He is not interested in pursuing any additional evaluation at this time.   Return to office in 3 months  Chief Complaint:  Chief Complaint  Patient presents with   Benign Prostatic Hypertrophy    History of Present Illness:  Benjamin [name] is a 88 y.o. year old male who is seen for further evaluation of BPH with obstruction, history of urinary retention, and UTI's.   He presented to the emergency room on 06/11/2021 with lower abdominal pain.  He was noted to have 700 mL in the bladder and a Foley catheter was placed with resolution of his abdominal pain.  He reported dysuria and gross hematuria for 1-2 days prior to presentation.  He denied any significant urinary symptoms prior to this episode.  Urine culture grew >100 K E. coli.  He was treated with Rocephin and placed on cephalexin.  He has a prior history of hematuria with clot retention and March 2016.  He underwent cystoscopy with clot evacuation, fulguration, and bladder biopsy in March 2016.  Pathology showed no evidence of malignancy.  He is status post a TURP in 2010.  Pathology was benign. He was previously on tamsulosin but had not taken the medication for approximately 1 year. He reported a 20 pound weight loss within the past month.  No flank or abdominal pain.  Labs from 06/11/2021 showed an elevated white count of 13.5, creatinine 0.95, normal LFTs.  His Foley was removed on 06/27/2021 after a voiding trial.  He was continued on tamsulosin 0.4 mg daily.  He was given Bactrim DS x7 days.  At his visit on 07/09/2021, he continued on tamsulosin 0.4 mg daily.  He reported that he was voiding without  difficulty.  No dysuria or gross hematuria. PVR = 733 mL.  He was treated with cefdinir x7 days for possible infection.  His urine culture was not done due to improper identification of the sample.  His Flomax was increased to 0.4 mg twice daily.  He continued to void spontaneously with some increased nocturia and incontinence at night as well as urgency.  No dysuria or gross hematuria.   IPSS = 14.  PVR = >401 ml. Urine culture from 07/18/21:  50-100K candida glabrata.  Treated with fluconazole. He was evaluated with cystoscopy in 1/23.  This showed post TURP changes in the prostatic urethra, a large capacity bladder with debris making visualization difficult, bladder trabeculations, and diffuse erythema of the bladder mucosa. Urine culture grew >100 K Enterococcus.  Treated with Levaquin.   Urine cytology showed a negative FISH.  At his visit in 2/23, he continued on tamsulosin 0.4 mg daily.  He denied any significant lower urinary tract symptoms.  No dysuria or gross hematuria. IPSS = 3. Urine culture from 2/23 grew 50-100 K mixed flora. Urine culture from 623 grew >100 K mixed urogenital flora. Creatinine from 6/23 was 0.87.  He returns today for scheduled follow-up.  He continues on tamsulosin 0.4 mg daily.  His urinary symptoms remain stable.  No dysuria or gross hematuria.  No flank or back pain. IPSS = 13 today.  Portions of the above documentation were copied from a prior visit for review purposes  only.   Past Medical History:  Past Medical History:  Diagnosis Date   Hypercholesteremia    Thyroid disease    Urinary retention     Past Surgical History:  Past Surgical History:  Procedure Laterality Date   BACK SURGERY     sciatic nerve    COLONOSCOPY  10/2009   Dr. Darrick Penna: frequent diverticula, pathology with lymphocytic colitis. Large internal hemorrhoids.    FLEXIBLE SIGMOIDOSCOPY N/A 11/12/2016   Procedure: FLEXIBLE SIGMOIDOSCOPY;  Surgeon: Corbin Ade, MD;  Location:  [facility];  Service: Endoscopy;  Laterality: N/A;   TRANSURETHRAL RESECTION OF BLADDER TUMOR Bilateral 10/31/2014   Procedure: CYSTO, BLADDER BIOPSY/CLOT EVACUATION, , BILATERAL RETROGRADE PYELOGRAM,BILATERAL URETERAL STENT PLACEMENT;  Surgeon: Sebastian Ache, MD;  Location: [facility];  Service: Urology;  Laterality: Bilateral;    Allergies:  No Known Allergies  Family History:  Family History  Problem Relation Age of Onset   Colon cancer Neg Hx     Social History:  Social History   Tobacco Use   Smoking status: Never   Smokeless tobacco: Never  Substance Use Topics   Alcohol use: No   Drug use: No   ROS: Constitutional:  Negative for fever, chills, weight loss CV: Negative for chest pain, previous MI, hypertension Respiratory:  Negative for shortness of breath, wheezing, sleep apnea, frequent cough GI:  Negative for nausea, vomiting, bloody stool, GERD  Physical exam: BP 119/68   Pulse 92   Ht 5\' 7"  (1.702 m)   Wt 120 lb (54.4 kg)   BMI 18.79 kg/m  GENERAL APPEARANCE:  Well appearing, well developed, well nourished, NAD HEENT:  Atraumatic, normocephalic, oropharynx clear NECK:  Supple without lymphadenopathy or thyromegaly ABDOMEN:  Soft, non-tender, no masses EXTREMITIES:  Moves all extremities well, without clubbing, cyanosis, or edema NEUROLOGIC:  Alert and oriented x 3, CN II-XII grossly intact MENTAL STATUS:  appropriate BACK:  Non-tender to palpation, No CVAT SKIN:  Warm, dry, and intact  Results: U/A: >30 WBCs, 3-10 RBCs, many bacteria

## 2022-05-09 LAB — URINALYSIS, ROUTINE W REFLEX MICROSCOPIC
Bilirubin, UA: NEGATIVE
Glucose, UA: NEGATIVE
Ketones, UA: NEGATIVE
Nitrite, UA: NEGATIVE
Specific Gravity, UA: 1.02 (ref 1.005–1.030)
Urobilinogen, Ur: 0.2 mg/dL (ref 0.2–1.0)
pH, UA: 8.5 — ABNORMAL HIGH (ref 5.0–7.5)

## 2022-05-09 LAB — MICROSCOPIC EXAMINATION: WBC, UA: >30 /hpf — AB (ref 0–5)

## 2022-05-12 ENCOUNTER — Other Ambulatory Visit: Payer: Self-pay | Admitting: Urology

## 2022-05-12 ENCOUNTER — Telehealth: Payer: Self-pay

## 2022-05-12 DIAGNOSIS — R8290 Unspecified abnormal findings in urine: Secondary | ICD-10-CM

## 2022-05-12 MED ORDER — AMOXICILLIN-POT CLAVULANATE 500-125 MG PO TABS: 1.0000 | ORAL_TABLET | Freq: Two times a day (BID) | ORAL | 0 refills | Status: AC

## 2022-05-12 NOTE — Telephone Encounter (Signed)
Unable to reach patient by phone or leave a voicemail.  Will try again at a later time.

## 2022-05-12 NOTE — Telephone Encounter (Signed)
-----   Message from Bradley J. Stoneking, MD sent at 05/12/2022 11:19 AM EDT ----- Please notify Mr. Goodgame to begin Augmentin BID x 7 days for UTI.  Rx sent.   

## 2022-05-13 ENCOUNTER — Telehealth: Payer: Self-pay

## 2022-05-13 NOTE — Telephone Encounter (Signed)
Patient has already started abx treatment.

## 2022-05-13 NOTE — Telephone Encounter (Signed)
-----   Message from Primus Bravo, MD sent at 05/12/2022 11:19 AM EDT ----- Please notify Mr. Song to begin Augmentin BID x 7 days for UTI.  Rx sent.

## 2022-07-10 ENCOUNTER — Other Ambulatory Visit: Payer: Self-pay | Admitting: Urology

## 2022-07-10 DIAGNOSIS — R339 Retention of urine, unspecified: Secondary | ICD-10-CM

## 2022-08-13 ENCOUNTER — Ambulatory Visit: Payer: No Typology Code available for payment source | Admitting: Urology

## 2022-08-28 ENCOUNTER — Ambulatory Visit (INDEPENDENT_AMBULATORY_CARE_PROVIDER_SITE_OTHER): Payer: Medicare Other | Admitting: Urology

## 2022-08-28 VITALS — BP 131/75 | HR 108

## 2022-08-28 DIAGNOSIS — R339 Retention of urine, unspecified: Secondary | ICD-10-CM | POA: Diagnosis not present

## 2022-08-28 DIAGNOSIS — N138 Other obstructive and reflux uropathy: Secondary | ICD-10-CM | POA: Diagnosis not present

## 2022-08-28 DIAGNOSIS — Z87440 Personal history of urinary (tract) infections: Secondary | ICD-10-CM | POA: Diagnosis not present

## 2022-08-28 DIAGNOSIS — N401 Enlarged prostate with lower urinary tract symptoms: Secondary | ICD-10-CM | POA: Diagnosis not present

## 2022-08-28 DIAGNOSIS — R8290 Unspecified abnormal findings in urine: Secondary | ICD-10-CM | POA: Diagnosis not present

## 2022-08-28 MED ORDER — TAMSULOSIN HCL 0.4 MG PO CAPS: ORAL_CAPSULE | ORAL | 3 refills | Status: DC

## 2022-08-28 NOTE — Progress Notes (Signed)
Subjective:  1. BPH with obstruction/lower urinary tract symptoms   2. Incomplete emptying of bladder   3. History of UTI   4. Abnormal urine findings     08/28/22: Benjamin [name] returns today for 3 month f/u.  He had been seeing Dr. Pete Glatter.  He has a history of BPH with BOO with prior UTI's and retention.  He remains on tamsulosin bid.  He has increased frequency when he drinks tea.  His IPSS is 2.  He doesn't get up at night but wears a diaper that does get a little wet.  He has had no hematuria or dysuria but his UA has >30 WBC's, 3-10 RBC's and many bact. His PVR is 480+ml. He has a chronically elevated PVR.   GU Hx: Benjamin [name] is a 89 y.o. year old male who is seen for further evaluation of BPH with obstruction, history of urinary retention, and UTI's.   He presented to the emergency room on 06/11/2021 with lower abdominal pain.  He was noted to have 700 mL in the bladder and a Foley catheter was placed with resolution of his abdominal pain.  He reported dysuria and gross hematuria for 1-2 days prior to presentation.  He denied any significant urinary symptoms prior to this episode.  Urine culture grew >100 K E. coli.  He was treated with Rocephin and placed on cephalexin.  He has a prior history of hematuria with clot retention and March 2016.  He underwent cystoscopy with clot evacuation, fulguration, and bladder biopsy in March 2016.  Pathology showed no evidence of malignancy.  He is status post a TURP in 2010.  Pathology was benign. He was previously on tamsulosin but had not taken the medication for approximately 1 year. He reported a 20 pound weight loss within the past month.  No flank or abdominal pain.  Labs from 06/11/2021 showed an elevated white count of 13.5, creatinine 0.95, normal LFTs.  His Foley was removed on 06/27/2021 after a voiding trial.  He was continued on tamsulosin 0.4 mg daily.  He was given Bactrim DS x7 days.  At his visit on 07/09/2021, he continued on  tamsulosin 0.4 mg daily.  He reported that he was voiding without difficulty.  No dysuria or gross hematuria. PVR = 733 mL.  He was treated with cefdinir x7 days for possible infection.  His urine culture was not done due to improper identification of the sample.  His Flomax was increased to 0.4 mg twice daily.  He continued to void spontaneously with some increased nocturia and incontinence at night as well as urgency.  No dysuria or gross hematuria.   IPSS = 14.  PVR = >401 ml. Urine culture from 07/18/21:  50-100K candida glabrata.  Treated with fluconazole. He was evaluated with cystoscopy in 1/23.  This showed post TURP changes in the prostatic urethra, a large capacity bladder with debris making visualization difficult, bladder trabeculations, and diffuse erythema of the bladder mucosa. Urine culture grew >100 K Enterococcus.  Treated with Levaquin.   Urine cytology showed a negative FISH.  At his visit in 2/23, he continued on tamsulosin 0.4 mg daily.  He denied any significant lower urinary tract symptoms.  No dysuria or gross hematuria. IPSS = 3. Urine culture from 2/23 grew 50-100 K mixed flora. Urine culture from 623 grew >100 K mixed urogenital flora. Creatinine from 6/23 was 0.87.   IPSS     Row Name 08/28/22 1500  International Prostate Symptom Score   How often have you had the sensation of not emptying your bladder? Not at All     How often have you had to urinate less than every two hours? Not at All     How often have you found you stopped and started again several times when you urinated? Not at All     How often have you found it difficult to postpone urination? Not at All     How often have you had a weak urinary stream? Less than half the time     How often have you had to strain to start urination? Less than half the time     How many times did you typically get up at night to urinate? None     Total IPSS Score 4       Quality of Life due to urinary  symptoms   If you were to spend the rest of your life with your urinary condition just the way it is now how would you feel about that? Delighted               ROS:  ROS:  A complete review of systems was performed.  All systems are negative except for pertinent findings as noted.   Review of Systems  All other systems reviewed and are negative.   No Known Allergies  Outpatient Encounter Medications as of 08/28/2022  Medication Sig   [DISCONTINUED] tamsulosin (FLOMAX) 0.4 MG CAPS capsule TAKE 1 CAPSULE (0.4 MG TOTAL) BY MOUTH IN THE MORNING AND AT BEDTIME.   tamsulosin (FLOMAX) 0.4 MG CAPS capsule TAKE 1 CAPSULE (0.4 MG TOTAL) BY MOUTH IN THE MORNING .   No facility-administered encounter medications on file as of 08/28/2022.    Past Medical History:  Diagnosis Date   Hypercholesteremia    Thyroid disease    Urinary retention     Past Surgical History:  Procedure Laterality Date   BACK SURGERY     sciatic nerve    COLONOSCOPY  10/2009   Dr. Oneida Alar: frequent diverticula, pathology with lymphocytic colitis. Large internal hemorrhoids.    FLEXIBLE SIGMOIDOSCOPY N/A 11/12/2016   Procedure: FLEXIBLE SIGMOIDOSCOPY;  Surgeon: Daneil Dolin, MD;  Location: [facility];  Service: Endoscopy;  Laterality: N/A;   TRANSURETHRAL RESECTION OF BLADDER TUMOR Bilateral 10/31/2014   Procedure: CYSTO, BLADDER BIOPSY/CLOT EVACUATION, , BILATERAL RETROGRADE PYELOGRAM,BILATERAL URETERAL STENT PLACEMENT;  Surgeon: Alexis Frock, MD;  Location: [facility];  Service: Urology;  Laterality: Bilateral;    Social History   Socioeconomic History   Marital status: Married    Spouse name: Not on file   Number of children: Not on file   Years of education: Not on file   Highest education level: Not on file  Occupational History   Occupation: retired  Tobacco Use   Smoking status: Never   Smokeless tobacco: Never  Substance and Sexual Activity   Alcohol use: No   Drug use: No   Sexual  activity: Not on file  Other Topics Concern   Not on file  Social History Narrative   Not on file   Social Determinants of Health   Financial Resource Strain: Not on file  Food Insecurity: Not on file  Transportation Needs: Not on file  Physical Activity: Not on file  Stress: Not on file  Social Connections: Not on file  Intimate Partner Violence: Not on file    Family History  Problem Relation Age of Onset  Colon cancer Neg Hx        Objective: Vitals:   08/28/22 1509  BP: 131/75  Pulse: (!) 108     Physical Exam  Lab Results:  PSA PSA  Date Value Ref Range Status  07/18/2008 8.52 (H) 0.10 - 4.00 ng/mL Final    Comment:    See lab report for associated comment(s)   No results found for: "TESTOSTERONE"  UA reviewed.   Studies/Results: No results found.  PVR is 480+ ml.   Assessment & Plan: BPH with chronically elevated PVR.   He will continue tamsulosin and return in 6 months.  Chronic cystitis.   His UA has stable pyuria and bacteriuria today.   I will get a culture but not treat unless he develops symptoms.    Meds ordered this encounter  Medications   tamsulosin (FLOMAX) 0.4 MG CAPS capsule    Sig: TAKE 1 CAPSULE (0.4 MG TOTAL) BY MOUTH IN THE MORNING .    Dispense:  100 capsule    Refill:  3     Orders Placed This Encounter  Procedures   Urine Culture   Microscopic Examination   Urinalysis, Routine w reflex microscopic      Return in about 6 months (around 02/26/2023) for with PVR.   CC: Pcp, No      Irine Seal 08/29/2022

## 2022-08-29 LAB — URINALYSIS, ROUTINE W REFLEX MICROSCOPIC
Bilirubin, UA: NEGATIVE
Glucose, UA: NEGATIVE
Ketones, UA: NEGATIVE
Nitrite, UA: NEGATIVE
Specific Gravity, UA: 1.025 (ref 1.005–1.030)
Urobilinogen, Ur: 0.2 mg/dL (ref 0.2–1.0)
pH, UA: 5.5 (ref 5.0–7.5)

## 2022-08-29 LAB — MICROSCOPIC EXAMINATION: WBC, UA: >30 /hpf — AB (ref 0–5)

## 2022-08-30 LAB — URINE CULTURE

## 2022-09-01 ENCOUNTER — Telehealth: Payer: Self-pay

## 2022-09-01 NOTE — Telephone Encounter (Signed)
Made patient aware that the urine showed mixed flora and no treatment is needed. Patient voiced understanding

## 2022-09-01 NOTE — Telephone Encounter (Signed)
-----  Message from Irine Seal, MD sent at 09/01/2022  2:19 PM EST ----- Mixed flora.  No treatment.  ----- Message ----- From: Sherrilyn Rist, CMA Sent: 09/01/2022  11:47 AM EST To: Irine Seal, MD  Please review

## 2023-03-05 ENCOUNTER — Ambulatory Visit: Payer: Medicare Other | Admitting: Urology

## 2024-09-13 ENCOUNTER — Encounter (HOSPITAL_COMMUNITY): Payer: Self-pay | Admitting: Emergency Medicine

## 2024-09-13 ENCOUNTER — Other Ambulatory Visit: Payer: Self-pay

## 2024-09-13 ENCOUNTER — Emergency Department (HOSPITAL_COMMUNITY)

## 2024-09-13 ENCOUNTER — Inpatient Hospital Stay (HOSPITAL_COMMUNITY)
Admission: EM | Admit: 2024-09-13 | Discharge: 2024-09-23 | Disposition: A | Source: Home / Self Care | Attending: Pulmonary Disease | Admitting: Pulmonary Disease

## 2024-09-13 DIAGNOSIS — R7989 Other specified abnormal findings of blood chemistry: Secondary | ICD-10-CM | POA: Diagnosis not present

## 2024-09-13 DIAGNOSIS — Z515 Encounter for palliative care: Secondary | ICD-10-CM

## 2024-09-13 DIAGNOSIS — R531 Weakness: Secondary | ICD-10-CM

## 2024-09-13 DIAGNOSIS — K8042 Calculus of bile duct with acute cholecystitis without obstruction: Secondary | ICD-10-CM

## 2024-09-13 DIAGNOSIS — R112 Nausea with vomiting, unspecified: Secondary | ICD-10-CM

## 2024-09-13 DIAGNOSIS — K449 Diaphragmatic hernia without obstruction or gangrene: Secondary | ICD-10-CM

## 2024-09-13 DIAGNOSIS — R7401 Elevation of levels of liver transaminase levels: Secondary | ICD-10-CM | POA: Diagnosis not present

## 2024-09-13 DIAGNOSIS — I480 Paroxysmal atrial fibrillation: Secondary | ICD-10-CM | POA: Diagnosis not present

## 2024-09-13 DIAGNOSIS — K810 Acute cholecystitis: Secondary | ICD-10-CM | POA: Diagnosis present

## 2024-09-13 DIAGNOSIS — J9601 Acute respiratory failure with hypoxia: Principal | ICD-10-CM

## 2024-09-13 DIAGNOSIS — K8000 Calculus of gallbladder with acute cholecystitis without obstruction: Secondary | ICD-10-CM | POA: Diagnosis not present

## 2024-09-13 LAB — CBC WITH DIFFERENTIAL/PLATELET
Abs Immature Granulocytes: 0.06 10*3/uL (ref 0.00–0.07)
Basophils Absolute: 0 10*3/uL (ref 0.0–0.1)
Basophils Relative: 0 %
Eosinophils Absolute: 0 10*3/uL (ref 0.0–0.5)
Eosinophils Relative: 0 %
HCT: 37.3 % — ABNORMAL LOW (ref 39.0–52.0)
Hemoglobin: 12 g/dL — ABNORMAL LOW (ref 13.0–17.0)
Immature Granulocytes: 1 %
Lymphocytes Relative: 9 %
Lymphs Abs: 1 10*3/uL (ref 0.7–4.0)
MCH: 29.2 pg (ref 26.0–34.0)
MCHC: 32.2 g/dL (ref 30.0–36.0)
MCV: 90.8 fL (ref 80.0–100.0)
Monocytes Absolute: 1 10*3/uL (ref 0.1–1.0)
Monocytes Relative: 9 %
Neutro Abs: 9 10*3/uL — ABNORMAL HIGH (ref 1.7–7.7)
Neutrophils Relative %: 81 %
Platelets: 418 10*3/uL — ABNORMAL HIGH (ref 150–400)
RBC: 4.11 MIL/uL — ABNORMAL LOW (ref 4.22–5.81)
RDW: 13.8 % (ref 11.5–15.5)
WBC: 11.1 10*3/uL — ABNORMAL HIGH (ref 4.0–10.5)
nRBC: 0 % (ref 0.0–0.2)

## 2024-09-13 LAB — BLOOD GAS, VENOUS
Acid-Base Excess: 0.3 mmol/L (ref 0.0–2.0)
Bicarbonate: 25.4 mmol/L (ref 20.0–28.0)
Drawn by: 65579
O2 Saturation: 39.7 %
Patient temperature: 37.7
pCO2, Ven: 43 mmHg — ABNORMAL LOW (ref 44–60)
pH, Ven: 7.38 (ref 7.25–7.43)
pO2, Ven: <31 mmHg — CL (ref 32–45)

## 2024-09-13 LAB — TROPONIN T, HIGH SENSITIVITY
Troponin T High Sensitivity: 11 ng/L (ref 0–19)
Troponin T High Sensitivity: 11 ng/L (ref 0–19)

## 2024-09-13 LAB — COMPREHENSIVE METABOLIC PANEL WITH GFR
ALT: 210 U/L — ABNORMAL HIGH (ref 0–44)
AST: 812 U/L — ABNORMAL HIGH (ref 15–41)
Albumin: 3.3 g/dL — ABNORMAL LOW (ref 3.5–5.0)
Alkaline Phosphatase: 422 U/L — ABNORMAL HIGH (ref 38–126)
Anion gap: 17 — ABNORMAL HIGH (ref 5–15)
BUN: 29 mg/dL — ABNORMAL HIGH (ref 8–23)
CO2: 20 mmol/L — ABNORMAL LOW (ref 22–32)
Calcium: 8.7 mg/dL — ABNORMAL LOW (ref 8.9–10.3)
Chloride: 101 mmol/L (ref 98–111)
Creatinine, Ser: 0.84 mg/dL (ref 0.61–1.24)
GFR, Estimated: >60 mL/min
Glucose, Bld: 104 mg/dL — ABNORMAL HIGH (ref 70–99)
Potassium: 3.8 mmol/L (ref 3.5–5.1)
Sodium: 137 mmol/L (ref 135–145)
Total Bilirubin: 1 mg/dL (ref 0.0–1.2)
Total Protein: 6.9 g/dL (ref 6.5–8.1)

## 2024-09-13 LAB — RESP PANEL BY RT-PCR (RSV, FLU A&B, COVID)  RVPGX2
Influenza A by PCR: NEGATIVE
Influenza B by PCR: NEGATIVE
Resp Syncytial Virus by PCR: NEGATIVE
SARS Coronavirus 2 by RT PCR: NEGATIVE

## 2024-09-13 LAB — LACTIC ACID, PLASMA: Lactic Acid, Venous: 1.5 mmol/L (ref 0.5–1.9)

## 2024-09-13 LAB — PRO BRAIN NATRIURETIC PEPTIDE: Pro Brain Natriuretic Peptide: 778 pg/mL — ABNORMAL HIGH

## 2024-09-13 LAB — MAGNESIUM: Magnesium: 2.1 mg/dL (ref 1.7–2.4)

## 2024-09-13 LAB — LIPASE, BLOOD: Lipase: 13 U/L (ref 11–51)

## 2024-09-13 LAB — TSH: TSH: 15.6 u[IU]/mL — ABNORMAL HIGH (ref 0.350–4.500)

## 2024-09-13 MED ORDER — MORPHINE SULFATE (PF) 4 MG/ML IV SOLN
4.0000 mg | Freq: Once | INTRAVENOUS | Status: AC
  Administered 2024-09-13: 4 mg via INTRAVENOUS
  Filled 2024-09-13: qty 1

## 2024-09-13 MED ORDER — ONDANSETRON HCL 4 MG/2ML IJ SOLN
4.0000 mg | Freq: Once | INTRAMUSCULAR | Status: AC
  Administered 2024-09-13: 4 mg via INTRAVENOUS
  Filled 2024-09-13: qty 2

## 2024-09-13 MED ORDER — SODIUM CHLORIDE 0.9 % IV BOLUS
500.0000 mL | Freq: Once | INTRAVENOUS | Status: AC
  Administered 2024-09-13: 500 mL via INTRAVENOUS

## 2024-09-13 MED ORDER — FENTANYL CITRATE (PF) 100 MCG/2ML IJ SOLN
50.0000 ug | Freq: Once | INTRAMUSCULAR | Status: DC
  Filled 2024-09-13: qty 2

## 2024-09-13 MED ORDER — PANTOPRAZOLE SODIUM 40 MG IV SOLR
40.0000 mg | Freq: Once | INTRAVENOUS | Status: AC
  Administered 2024-09-13: 40 mg via INTRAVENOUS
  Filled 2024-09-13: qty 10

## 2024-09-13 MED ORDER — PANTOPRAZOLE SODIUM 40 MG IV SOLR
40.0000 mg | INTRAVENOUS | Status: DC
  Administered 2024-09-14 – 2024-09-18 (×5): 40 mg via INTRAVENOUS
  Filled 2024-09-13 (×5): qty 10

## 2024-09-13 MED ORDER — ALUM & MAG HYDROXIDE-SIMETH 200-200-20 MG/5ML PO SUSP
30.0000 mL | Freq: Once | ORAL | Status: AC
  Administered 2024-09-13: 30 mL via ORAL
  Filled 2024-09-13: qty 30

## 2024-09-13 MED ORDER — MORPHINE SULFATE (PF) 2 MG/ML IV SOLN
2.0000 mg | INTRAVENOUS | Status: DC | PRN
  Administered 2024-09-14: 2 mg via INTRAVENOUS
  Filled 2024-09-13: qty 1

## 2024-09-13 MED ORDER — IOHEXOL 350 MG/ML SOLN
100.0000 mL | Freq: Once | INTRAVENOUS | Status: AC | PRN
  Administered 2024-09-13: 100 mL via INTRAVENOUS

## 2024-09-13 MED ORDER — PIPERACILLIN-TAZOBACTAM 3.375 G IVPB 30 MIN
3.3750 g | Freq: Once | INTRAVENOUS | Status: AC
  Administered 2024-09-14: 3.375 g via INTRAVENOUS
  Filled 2024-09-13: qty 50

## 2024-09-13 NOTE — ED Triage Notes (Signed)
 Pt bib [facility], he c/o n/v/d x 2 weeks with increased weakness. Ems gave pt 500cc of normal saline.

## 2024-09-13 NOTE — ED Notes (Signed)
 Off floor to CT

## 2024-09-13 NOTE — ED Provider Notes (Signed)
 " [facility] Provider Note   CSN: [id number] Arrival date & time: 09/13/24  1723     Patient presents with: Weakness   ANOTHY BUFANO is a 89 y.o. male.  He is brought in by ambulance for evaluation of weakness.  It is unclear how long this has been going on.  He said he has had some nausea and dry heaves, had some abdominal pain earlier which he said is resolved now.  Has been constipated.  No fevers or chills.  Cough productive of white sputum.  No prior history of lung disease.  No urinary symptoms.  No recent falls.  Lives with his wife.  {Add pertinent medical, surgical, social history, OB history to YEP:67052} The history is provided by the patient and the EMS personnel.  Weakness Severity:  Severe Onset quality:  Gradual Timing:  Constant Progression:  Worsening Chronicity:  New Associated symptoms: abdominal pain, cough, nausea, shortness of breath and vomiting   Associated symptoms: no diarrhea, no dysuria, no falls, no fever and no syncope        Prior to Admission medications  Medication Sig Start Date End Date Taking? Authorizing Provider  tamsulosin  (FLOMAX ) 0.4 MG CAPS capsule TAKE 1 CAPSULE (0.4 MG TOTAL) BY MOUTH IN THE MORNING . 08/28/22   Watt Rush, MD    Allergies: Patient has no known allergies.    Review of Systems  Constitutional:  Negative for fever.  Respiratory:  Positive for cough and shortness of breath.   Cardiovascular:  Negative for syncope.  Gastrointestinal:  Positive for abdominal pain, nausea and vomiting. Negative for diarrhea.  Genitourinary:  Negative for dysuria.  Musculoskeletal:  Negative for falls.  Neurological:  Positive for weakness.    Updated Vital Signs Ht 5' 7 (1.702 m)   Wt 54 kg   BMI 18.65 kg/m   Physical Exam Vitals and nursing note reviewed.  Constitutional:      General: He is not in acute distress.    Appearance: Normal appearance. He is well-developed.  HENT:      Head: Normocephalic and atraumatic.  Eyes:     Conjunctiva/sclera: Conjunctivae normal.  Cardiovascular:     Rate and Rhythm: Normal rate and regular rhythm.     Heart sounds: No murmur heard. Pulmonary:     Effort: Pulmonary effort is normal. No respiratory distress.     Breath sounds: Normal breath sounds.  Abdominal:     Palpations: Abdomen is soft.     Tenderness: There is no abdominal tenderness. There is no guarding or rebound.  Musculoskeletal:        General: No deformity.     Cervical back: Neck supple.  Skin:    General: Skin is warm and dry.     Capillary Refill: Capillary refill takes less than 2 seconds.  Neurological:     General: No focal deficit present.     Mental Status: He is alert.     (all labs ordered are listed, but only abnormal results are displayed) Labs Reviewed  RESP PANEL BY RT-PCR (RSV, FLU A&B, COVID)  [id number]  CULTURE, BLOOD (ROUTINE X 2)  CULTURE, BLOOD (ROUTINE X 2)  COMPREHENSIVE METABOLIC PANEL WITH GFR  LACTIC ACID, PLASMA  LACTIC ACID, PLASMA  LIPASE, BLOOD  CBC WITH DIFFERENTIAL/PLATELET  PRO BRAIN NATRIURETIC PEPTIDE  BLOOD GAS, VENOUS  URINALYSIS, ROUTINE W REFLEX MICROSCOPIC  MAGNESIUM   TROPONIN T, HIGH SENSITIVITY    EKG: EKG Interpretation Date/Time:  Tuesday  September 13 2024 17:43:33 EST Ventricular Rate:  101 PR Interval:  161 QRS Duration:  74 QT Interval:  420 QTC Calculation: 545 R Axis:   17  Text Interpretation: Sinus tachycardia with irregular rate Prolonged QT interval new QT prolongation from prior 1/10 Confirmed by Towana Sharper [phone]) on 09/13/2024 5:52:17 PM  Radiology: No results found.  {Document cardiac monitor, telemetry assessment procedure when appropriate:32947} Procedures   Medications Ordered in the ED  sodium chloride  0.9 % bolus 500 mL (has no administration in time range)      {Click here for ABCD2, HEART and other calculators REFRESH Note before signing:1}                               Medical Decision Making Amount and/or Complexity of Data Reviewed Labs: ordered. Radiology: ordered.   ***  {Document critical care time when appropriate  Document review of labs and clinical decision tools ie CHADS2VASC2, etc  Document your independent review of radiology images and any outside records  Document your discussion with family members, caretakers and with consultants  Document social determinants of health affecting pt's care  Document your decision making why or why not admission, treatments were needed:32947:::1}   Final diagnoses:  None    ED Discharge Orders     None        "

## 2024-09-13 NOTE — H&P (Incomplete)
 " History and Physical    Patient: Benjamin [name] FMW:[id number] DOB: Aug 03, 1933 DOA: 09/13/2024 DOS: the patient was seen and examined on 09/13/2024 PCP: Pcp, No  Patient coming from: Home  Chief Complaint:  Chief Complaint  Patient presents with   Weakness   HPI: Benjamin [name] is an 89 y.o. male with no significant medical history who presents to the emergency department via EMS from home due to 2-week onset of flulike symptoms including body aches, weakness, runny nose, subjective fever and decreased oral intake.  He complained of abdominal pain today which was associated with vomiting/dry heaves.  EMS was activated and 500 mL of NS was given  en route to the ED.  ED course In the emergency department, he was intermittently tachypneic and O2 sat was noted to be 84% on RA.  Oxygen at 4L was provided with improvement in O2 sats to 93-100%.  Workup in the ED showed normocytic anemia, WBC 11.1 and platelets of 418.  BMP was normal except for bicarb of 20, blood glucose 104, BUN 29, albumin  3.3, AST 812 ALT 210, ALP 422.  TSH 15.6, proBNP 778.  Respiratory panel was negative. CT abdomen and pelvis with contrast showed cholelithiasis and choledocholithiasis with intrahepatic and extrahepatic biliary ductal dilatation with suspicion for acute cholecystitis. CT angiography of chest ruled out pulmonary embolism, but showed large hiatal hernia Dr. Mavis (general surgery) was consulted and recommended admitting patient with plan to consult on patient in the morning.   Review of Systems: As mentioned in the history of present illness. All other systems reviewed and are negative. Past Medical History:  Diagnosis Date   Hypercholesteremia    Thyroid disease    Urinary retention    Past Surgical History:  Procedure Laterality Date   BACK SURGERY     sciatic nerve    COLONOSCOPY  10/2009   Dr. Harvey: frequent diverticula, pathology with lymphocytic colitis. Large internal hemorrhoids.     FLEXIBLE SIGMOIDOSCOPY N/A 11/12/2016   Procedure: FLEXIBLE SIGMOIDOSCOPY;  Surgeon: Lamar CHRISTELLA Hollingshead, MD;  [facility];  Service: Endoscopy;  Laterality: N/A;   TRANSURETHRAL RESECTION OF BLADDER TUMOR Bilateral 10/31/2014   Procedure: CYSTO, BLADDER BIOPSY/CLOT EVACUATION, , BILATERAL RETROGRADE PYELOGRAM,BILATERAL URETERAL STENT PLACEMENT;  Surgeon: Ricardo Likens, MD;  Location: [facility];  Service: Urology;  Laterality: Bilateral;   Social History:  reports that he has never smoked. He has never used smokeless tobacco. He reports that he does not drink alcohol and does not use drugs.  Allergies[1]  Family History  Problem Relation Age of Onset   Colon cancer Neg Hx     Prior to Admission medications  Medication Sig Start Date End Date Taking? Authorizing Provider  tamsulosin  (FLOMAX ) 0.4 MG CAPS capsule TAKE 1 CAPSULE (0.4 MG TOTAL) BY MOUTH IN THE MORNING . 08/28/22   Watt Rush, MD    Physical Exam: Vitals:   09/13/24 1930 09/13/24 1940 09/13/24 2030 09/13/24 2045  BP: (!) 144/84  (!) 150/72 (!) 151/71  Pulse: 91  64 78  Resp: (!) 23  19 (!) 22  Temp: 98.7 F (37.1 C)     TempSrc: Oral     SpO2: 98% 98% 100% 98%  Weight:      Height:       General: Elderly male. Awake and alert and oriented x3. Not in any acute distress.  HEENT: NCAT.  PERRLA. EOMI. Sclerae anicteric.  Moist mucosal membranes. Neck: Neck supple without lymphadenopathy. No carotid bruits. No  masses palpated.  Cardiovascular: Regular rate with normal S1-S2 sounds. No murmurs, rubs or gallops auscultated. No JVD.  Respiratory: Clear breath sounds.  No accessory muscle use. Abdomen: Soft, tender to palpation in epigastric and RUQ.  Active bowel sounds. Skin: No rashes, lesions, or ulcerations.  Dry, warm to touch. Musculoskeletal:  2+ dorsalis pedis and radial pulses. Good ROM.  No contractures  Psychiatric: Intact judgment and insight.  Mood appropriate to current condition. Neurologic: No  focal neurological deficits. Strength is 5/5 x 4.  CN II - XII grossly intact.  Assessment and Plan: Acute cholecystitis with cholelithiasis CT abdomen and pelvis suspicious for acute cholecystitis IV morphine  4 mg x 1 was given, continue IV morphine  2 mg every 4 hours as needed IV Zosyn  was given, we shall continue with same at this time IV Protonix  40 mg was given in the ED, we shall continue with IV Protonix  40 mg daily RUQ ultrasound will be done in the morning Patient will be kept n.p.o. after midnight General surgery (Dr. Mavis) already consulted by EDP and will see patient in the morning  Nausea and vomiting in the setting of above Continue Zofran  as needed  Transaminasemia possibly due to acute cholecystitis RUQ ultrasound will be done in the morning Hepatitis panel will be done Patient may require MRCP/ERCP General Surgery already consulted for acute cholecystitis  Elevated TSH TSH 15.6, free T4 will be checked  Elevated proBNP proBNP 778; this is below proBNP for ADHF based on age    Advance Care Planning:   Code Status: Prior ***  Consults: ***  Family Communication: ***  Severity of Illness: {Observation/Inpatient:21159}  Author: Posey Maier, DO 09/13/2024 10:07 PM  For on call review [email or web address].        [1] No Known Allergies "

## 2024-09-13 NOTE — H&P (Incomplete)
" °  History and Physical    Patient: Benjamin [name] FMW:[id number] DOB: May 31, 1933 DOA: 09/13/2024 DOS: the patient was seen and examined on 09/13/2024 PCP: Pcp, No  Patient coming from: {Point_of_Origin:26777}  Chief Complaint:  Chief Complaint  Patient presents with   Weakness   HPI: Benjamin [name] is a 89 y.o. male with medical history significant of ***  Review of Systems: {ROS_Text:26778} Past Medical History:  Diagnosis Date   Hypercholesteremia    Thyroid disease    Urinary retention    Past Surgical History:  Procedure Laterality Date   BACK SURGERY     sciatic nerve    COLONOSCOPY  10/2009   Dr. Harvey: frequent diverticula, pathology with lymphocytic colitis. Large internal hemorrhoids.    FLEXIBLE SIGMOIDOSCOPY N/A 11/12/2016   Procedure: FLEXIBLE SIGMOIDOSCOPY;  Surgeon: Lamar CHRISTELLA Hollingshead, MD;  Location: [facility];  Service: Endoscopy;  Laterality: N/A;   TRANSURETHRAL RESECTION OF BLADDER TUMOR Bilateral 10/31/2014   Procedure: CYSTO, BLADDER BIOPSY/CLOT EVACUATION, , BILATERAL RETROGRADE PYELOGRAM,BILATERAL URETERAL STENT PLACEMENT;  Surgeon: Ricardo Likens, MD;  Location: [facility];  Service: Urology;  Laterality: Bilateral;   Social History:  reports that he has never smoked. He has never used smokeless tobacco. He reports that he does not drink alcohol and does not use drugs.  Allergies[1]  Family History  Problem Relation Age of Onset   Colon cancer Neg Hx     Prior to Admission medications  Medication Sig Start Date End Date Taking? Authorizing Provider  tamsulosin  (FLOMAX ) 0.4 MG CAPS capsule TAKE 1 CAPSULE (0.4 MG TOTAL) BY MOUTH IN THE MORNING . 08/28/22   Watt Rush, MD    Physical Exam: Vitals:   09/13/24 1930 09/13/24 1940 09/13/24 2030 09/13/24 2045  BP: (!) 144/84  (!) 150/72 (!) 151/71  Pulse: 91  64 78  Resp: (!) 23  19 (!) 22  Temp: 98.7 F (37.1 C)     TempSrc: Oral     SpO2: 98% 98% 100% 98%  Weight:      Height:       *** Data  Reviewed: {Tip this will not be part of the note when signed- Document your independent interpretation of telemetry tracing, EKG, lab, Radiology test or any other diagnostic tests. Add any new diagnostic test ordered today. (Optional):26781} {Results:26384}  Assessment and Plan: No notes have been filed under this hospital service. Service: Hospitalist     Advance Care Planning:   Code Status: Prior ***  Consults: ***  Family Communication: ***  Severity of Illness: {Observation/Inpatient:21159}  Author: Posey Maier, DO 09/13/2024 10:07 PM  For on call review [email or web address].     [1] No Known Allergies  "

## 2024-09-14 ENCOUNTER — Inpatient Hospital Stay (HOSPITAL_COMMUNITY)

## 2024-09-14 DIAGNOSIS — K8042 Calculus of bile duct with acute cholecystitis without obstruction: Secondary | ICD-10-CM

## 2024-09-14 DIAGNOSIS — E039 Hypothyroidism, unspecified: Secondary | ICD-10-CM | POA: Diagnosis not present

## 2024-09-14 DIAGNOSIS — E872 Acidosis, unspecified: Secondary | ICD-10-CM

## 2024-09-14 DIAGNOSIS — K8309 Other cholangitis: Secondary | ICD-10-CM

## 2024-09-14 DIAGNOSIS — A4151 Sepsis due to Escherichia coli [E. coli]: Secondary | ICD-10-CM

## 2024-09-14 DIAGNOSIS — I480 Paroxysmal atrial fibrillation: Secondary | ICD-10-CM | POA: Diagnosis not present

## 2024-09-14 DIAGNOSIS — R6521 Severe sepsis with septic shock: Secondary | ICD-10-CM

## 2024-09-14 DIAGNOSIS — K810 Acute cholecystitis: Secondary | ICD-10-CM

## 2024-09-14 DIAGNOSIS — E876 Hypokalemia: Secondary | ICD-10-CM | POA: Diagnosis not present

## 2024-09-14 DIAGNOSIS — R7401 Elevation of levels of liver transaminase levels: Secondary | ICD-10-CM | POA: Diagnosis not present

## 2024-09-14 LAB — RENAL FUNCTION PANEL
Albumin: 2.4 g/dL — ABNORMAL LOW (ref 3.5–5.0)
Anion gap: 16 — ABNORMAL HIGH (ref 5–15)
BUN: 29 mg/dL — ABNORMAL HIGH (ref 8–23)
CO2: 18 mmol/L — ABNORMAL LOW (ref 22–32)
Calcium: 7.3 mg/dL — ABNORMAL LOW (ref 8.9–10.3)
Chloride: 106 mmol/L (ref 98–111)
Creatinine, Ser: 1.51 mg/dL — ABNORMAL HIGH (ref 0.61–1.24)
GFR, Estimated: 44 mL/min — ABNORMAL LOW
Glucose, Bld: 155 mg/dL — ABNORMAL HIGH (ref 70–99)
Phosphorus: 3.2 mg/dL (ref 2.5–4.6)
Potassium: 4.2 mmol/L (ref 3.5–5.1)
Sodium: 139 mmol/L (ref 135–145)

## 2024-09-14 LAB — POCT I-STAT 7, (LYTES, BLD GAS, ICA,H+H)
Acid-base deficit: 9 mmol/L — ABNORMAL HIGH (ref 0.0–2.0)
Bicarbonate: 14.8 mmol/L — ABNORMAL LOW (ref 20.0–28.0)
Calcium, Ion: 1.04 mmol/L — ABNORMAL LOW (ref 1.15–1.40)
HCT: 29 % — ABNORMAL LOW (ref 39.0–52.0)
Hemoglobin: 9.9 g/dL — ABNORMAL LOW (ref 13.0–17.0)
O2 Saturation: 100 %
Patient temperature: 36.7
Potassium: 4 mmol/L (ref 3.5–5.1)
Sodium: 138 mmol/L (ref 135–145)
TCO2: 15 mmol/L — ABNORMAL LOW (ref 22–32)
pCO2 arterial: 23.3 mmHg — ABNORMAL LOW (ref 32–48)
pH, Arterial: 7.41 (ref 7.35–7.45)
pO2, Arterial: 390 mmHg — ABNORMAL HIGH (ref 83–108)

## 2024-09-14 LAB — LACTIC ACID, PLASMA
Lactic Acid, Venous: 3.1 mmol/L (ref 0.5–1.9)
Lactic Acid, Venous: 6.1 mmol/L (ref 0.5–1.9)

## 2024-09-14 LAB — URINALYSIS, ROUTINE W REFLEX MICROSCOPIC
Bilirubin Urine: NEGATIVE
Glucose, UA: NEGATIVE mg/dL
Hgb urine dipstick: NEGATIVE
Ketones, ur: 5 mg/dL — AB
Nitrite: NEGATIVE
Protein, ur: 30 mg/dL — AB
Specific Gravity, Urine: 1.034 — ABNORMAL HIGH (ref 1.005–1.030)
WBC, UA: >50 WBC/hpf (ref 0–5)
pH: 5 (ref 5.0–8.0)

## 2024-09-14 LAB — COMPREHENSIVE METABOLIC PANEL WITH GFR
ALT: 1217 U/L — ABNORMAL HIGH (ref 0–44)
AST: 3654 U/L — ABNORMAL HIGH (ref 15–41)
Albumin: 2.9 g/dL — ABNORMAL LOW (ref 3.5–5.0)
Alkaline Phosphatase: 755 U/L — ABNORMAL HIGH (ref 38–126)
Anion gap: 18 — ABNORMAL HIGH (ref 5–15)
BUN: 27 mg/dL — ABNORMAL HIGH (ref 8–23)
CO2: 16 mmol/L — ABNORMAL LOW (ref 22–32)
Calcium: 8.2 mg/dL — ABNORMAL LOW (ref 8.9–10.3)
Chloride: 106 mmol/L (ref 98–111)
Creatinine, Ser: 0.89 mg/dL (ref 0.61–1.24)
GFR, Estimated: >60 mL/min
Glucose, Bld: 100 mg/dL — ABNORMAL HIGH (ref 70–99)
Potassium: 3.4 mmol/L — ABNORMAL LOW (ref 3.5–5.1)
Sodium: 139 mmol/L (ref 135–145)
Total Bilirubin: 1.9 mg/dL — ABNORMAL HIGH (ref 0.0–1.2)
Total Protein: 6.1 g/dL — ABNORMAL LOW (ref 6.5–8.1)

## 2024-09-14 LAB — BLOOD CULTURE ID PANEL (REFLEXED) - BCID2

## 2024-09-14 LAB — HEPATITIS PANEL, ACUTE
HCV Ab: NONREACTIVE
Hep A IgM: NONREACTIVE
Hep B C IgM: NONREACTIVE
Hepatitis B Surface Ag: NONREACTIVE

## 2024-09-14 LAB — CBC
HCT: 39.3 % (ref 39.0–52.0)
Hemoglobin: 12.3 g/dL — ABNORMAL LOW (ref 13.0–17.0)
MCH: 28.6 pg (ref 26.0–34.0)
MCHC: 31.3 g/dL (ref 30.0–36.0)
MCV: 91.4 fL (ref 80.0–100.0)
Platelets: 351 10*3/uL (ref 150–400)
RBC: 4.3 MIL/uL (ref 4.22–5.81)
RDW: 14 % (ref 11.5–15.5)
WBC: 3.6 10*3/uL — ABNORMAL LOW (ref 4.0–10.5)
nRBC: 0 % (ref 0.0–0.2)

## 2024-09-14 LAB — PHOSPHORUS: Phosphorus: 2.3 mg/dL — ABNORMAL LOW (ref 2.5–4.6)

## 2024-09-14 LAB — GLUCOSE, CAPILLARY
Glucose-Capillary: 101 mg/dL — ABNORMAL HIGH (ref 70–99)
Glucose-Capillary: 136 mg/dL — ABNORMAL HIGH (ref 70–99)

## 2024-09-14 LAB — MRSA NEXT GEN BY PCR, NASAL
MRSA by PCR Next Gen: NOT DETECTED
MRSA by PCR Next Gen: NOT DETECTED

## 2024-09-14 LAB — PROTIME-INR
INR: 1.5 — ABNORMAL HIGH (ref 0.8–1.2)
Prothrombin Time: 18.4 s — ABNORMAL HIGH (ref 11.4–15.2)

## 2024-09-14 LAB — LIPASE, BLOOD: Lipase: <10 U/L — ABNORMAL LOW (ref 11–51)

## 2024-09-14 LAB — T4, FREE: Free T4: 1.07 ng/dL (ref 0.80–2.00)

## 2024-09-14 LAB — MAGNESIUM: Magnesium: 1.8 mg/dL (ref 1.7–2.4)

## 2024-09-14 MED ORDER — ONDANSETRON HCL 4 MG/2ML IJ SOLN
4.0000 mg | Freq: Four times a day (QID) | INTRAMUSCULAR | Status: DC | PRN
  Administered 2024-09-14 (×3): 4 mg via INTRAVENOUS
  Filled 2024-09-14 (×3): qty 2

## 2024-09-14 MED ORDER — LACTATED RINGERS IV SOLN: INTRAVENOUS | Status: DC

## 2024-09-14 MED ORDER — LIDOCAINE-EPINEPHRINE 1 %-1:100000 IJ SOLN
INTRAMUSCULAR | Status: AC
  Filled 2024-09-14: qty 20

## 2024-09-14 MED ORDER — PROCHLORPERAZINE EDISYLATE 10 MG/2ML IJ SOLN
10.0000 mg | Freq: Four times a day (QID) | INTRAMUSCULAR | Status: DC | PRN
  Administered 2024-09-14 – 2024-09-15 (×2): 10 mg via INTRAVENOUS
  Filled 2024-09-14 (×3): qty 2

## 2024-09-14 MED ORDER — LIDOCAINE HCL 1 % IJ SOLN
20.0000 mL | Freq: Once | INTRAMUSCULAR | Status: AC
  Administered 2024-09-14: 10 mL
  Filled 2024-09-14: qty 20

## 2024-09-14 MED ORDER — NOREPINEPHRINE 4 MG/250ML-% IV SOLN
0.0000 ug/min | INTRAVENOUS | Status: DC
  Administered 2024-09-14: 15 ug/min via INTRAVENOUS
  Administered 2024-09-14 – 2024-09-15 (×2): 2 ug/min via INTRAVENOUS
  Filled 2024-09-14 (×2): qty 250

## 2024-09-14 MED ORDER — HYDROMORPHONE HCL 1 MG/ML IJ SOLN
0.5000 mg | INTRAMUSCULAR | Status: DC | PRN
  Filled 2024-09-14: qty 1

## 2024-09-14 MED ORDER — CHLORHEXIDINE GLUCONATE CLOTH 2 % EX PADS
6.0000 | MEDICATED_PAD | Freq: Every day | CUTANEOUS | Status: AC
  Administered 2024-09-14 – 2024-09-22 (×10): 6 via TOPICAL

## 2024-09-14 MED ORDER — MAGNESIUM SULFATE IN D5W 1-5 GM/100ML-% IV SOLN
1.0000 g | Freq: Once | INTRAVENOUS | Status: AC
  Administered 2024-09-14: 1 g via INTRAVENOUS
  Filled 2024-09-14: qty 100

## 2024-09-14 MED ORDER — FENTANYL CITRATE (PF) 100 MCG/2ML IJ SOLN
INTRAMUSCULAR | Status: AC
  Filled 2024-09-14: qty 2

## 2024-09-14 MED ORDER — IOHEXOL 300 MG/ML  SOLN
50.0000 mL | Freq: Once | INTRAMUSCULAR | Status: AC | PRN
  Administered 2024-09-14: 10 mL

## 2024-09-14 MED ORDER — ONDANSETRON HCL 4 MG/2ML IJ SOLN
INTRAMUSCULAR | Status: AC
  Filled 2024-09-14: qty 2

## 2024-09-14 MED ORDER — ONDANSETRON HCL 4 MG PO TABS: 4.0000 mg | ORAL_TABLET | Freq: Four times a day (QID) | ORAL | Status: DC | PRN

## 2024-09-14 MED ORDER — ACETAMINOPHEN 325 MG PO TABS
650.0000 mg | ORAL_TABLET | Freq: Four times a day (QID) | ORAL | Status: DC | PRN
  Administered 2024-09-14 – 2024-09-20 (×7): 650 mg via ORAL
  Filled 2024-09-14 (×8): qty 2

## 2024-09-14 MED ORDER — FENTANYL CITRATE (PF) 100 MCG/2ML IJ SOLN
INTRAMUSCULAR | Status: AC | PRN
  Administered 2024-09-14: 25 ug via INTRAVENOUS

## 2024-09-14 MED ORDER — SODIUM BICARBONATE 8.4 % IV SOLN
INTRAVENOUS | Status: AC
  Filled 2024-09-14: qty 150
  Filled 2024-09-14 (×3): qty 1000

## 2024-09-14 MED ORDER — VASOPRESSIN 20 UNITS/100 ML INFUSION FOR SHOCK
0.0000 [IU]/min | INTRAVENOUS | Status: DC
  Administered 2024-09-14: 0.03 [IU]/min via INTRAVENOUS
  Filled 2024-09-14: qty 100

## 2024-09-14 MED ORDER — LACTATED RINGERS IV BOLUS
500.0000 mL | Freq: Once | INTRAVENOUS | Status: AC
  Administered 2024-09-14: 500 mL via INTRAVENOUS

## 2024-09-14 MED ORDER — ACETAMINOPHEN 650 MG RE SUPP: 650.0000 mg | Freq: Four times a day (QID) | RECTAL | Status: DC | PRN

## 2024-09-14 MED ORDER — SODIUM CHLORIDE 0.9 % IV SOLN
INTRAVENOUS | Status: AC
  Filled 2024-09-14: qty 2

## 2024-09-14 MED ORDER — SODIUM CHLORIDE 0.9% FLUSH
5.0000 mL | Freq: Three times a day (TID) | INTRAVENOUS | Status: AC
  Administered 2024-09-14 – 2024-09-23 (×26): 5 mL

## 2024-09-14 MED ORDER — ALBUMIN HUMAN 5 % IV SOLN
12.5000 g | Freq: Once | INTRAVENOUS | Status: AC
  Administered 2024-09-14: 12.5 g via INTRAVENOUS
  Filled 2024-09-14: qty 250

## 2024-09-14 MED ORDER — HYDROMORPHONE HCL 1 MG/ML IJ SOLN
0.5000 mg | INTRAMUSCULAR | Status: AC | PRN
  Administered 2024-09-14 – 2024-09-17 (×3): 0.5 mg via INTRAVENOUS
  Filled 2024-09-14 (×2): qty 1

## 2024-09-14 MED ORDER — KETOROLAC TROMETHAMINE 15 MG/ML IJ SOLN
15.0000 mg | Freq: Once | INTRAMUSCULAR | Status: AC
  Administered 2024-09-14: 15 mg via INTRAVENOUS
  Filled 2024-09-14: qty 1

## 2024-09-14 MED ORDER — BACLOFEN 5 MG HALF TABLET
5.0000 mg | ORAL_TABLET | Freq: Once | ORAL | Status: AC | PRN
  Administered 2024-09-19: 5 mg via ORAL
  Filled 2024-09-14 (×2): qty 1

## 2024-09-14 MED ORDER — SODIUM CHLORIDE 0.9 % IV SOLN
250.0000 mL | INTRAVENOUS | Status: AC
  Administered 2024-09-14: 250 mL via INTRAVENOUS

## 2024-09-14 MED ORDER — PIPERACILLIN-TAZOBACTAM 3.375 G IVPB
3.3750 g | Freq: Three times a day (TID) | INTRAVENOUS | Status: DC
  Administered 2024-09-14 – 2024-09-15 (×4): 3.375 g via INTRAVENOUS
  Filled 2024-09-14 (×4): qty 50

## 2024-09-14 MED ORDER — CALCIUM GLUCONATE-NACL 1-0.675 GM/50ML-% IV SOLN
1.0000 g | Freq: Once | INTRAVENOUS | Status: AC
  Administered 2024-09-14: 1000 mg via INTRAVENOUS
  Filled 2024-09-14: qty 50

## 2024-09-14 MED ORDER — SODIUM CHLORIDE 0.9 % IV BOLUS
500.0000 mL | Freq: Once | INTRAVENOUS | Status: AC
  Administered 2024-09-14: 500 mL via INTRAVENOUS

## 2024-09-14 MED ORDER — POTASSIUM CHLORIDE 10 MEQ/100ML IV SOLN
10.0000 meq | INTRAVENOUS | Status: AC
  Administered 2024-09-14 (×4): 10 meq via INTRAVENOUS
  Filled 2024-09-14 (×4): qty 100

## 2024-09-14 MED ORDER — MIDAZOLAM HCL (PF) 2 MG/2ML IJ SOLN
INTRAMUSCULAR | Status: AC | PRN
  Administered 2024-09-14: .5 mg via INTRAVENOUS

## 2024-09-14 MED ORDER — LACTATED RINGERS IV BOLUS
1500.0000 mL | Freq: Once | INTRAVENOUS | Status: AC
  Administered 2024-09-14: 1500 mL via INTRAVENOUS

## 2024-09-14 MED ORDER — MIDAZOLAM HCL 2 MG/2ML IJ SOLN
INTRAMUSCULAR | Status: AC
  Filled 2024-09-14: qty 2

## 2024-09-14 NOTE — Progress Notes (Signed)
 eLink Physician-Brief Progress Note Patient Name: Benjamin [name] DOB: 25-Jul-1933 MRN: [id number]   Date of Service  09/14/2024  HPI/Events of Note  Received inquiry if to increase bicarb drip running at 100 cc/hr. Most recent CO2 18 from 16. Creatinine increased to 1.51  Patient complaining of leg cramps and reports taking magnesium  for this. Mg 1.8, iCa 1.04  He remains NPO  eICU Interventions  Ordered magnesium  1 g IV and calcium  1g IVPB Ordered dilaudid  0.5 mg prn for pain Continue current bicarb drip for now Discussed with BSRN     Intervention Category Intermediate Interventions: Electrolyte abnormality - evaluation and management;Pain - evaluation and management  Benjamin [name] 09/14/2024, 9:09 PM

## 2024-09-14 NOTE — H&P (Signed)
 "  NAME:  Benjamin [name], MRN:  [id number], DOB:  1933/05/20, LOS: 1 ADMISSION DATE:  09/13/2024, CONSULTATION DATE:  09/14/24 REFERRING MD:  Dr. Ricky, CHIEF COMPLAINT:  septic shock   History of Present Illness:   [name] with PMH of BPH with urinary retention, HLD, thyroid disease, PUD/ GERD, Cdiff (2009), and duodenitis who presented from home to [facility] on 1/27 with 2 week history of body aches, weakness, subjective fever, decreased oral intake and onset of abd pain with vomiting after eating.  Found to have elevated LFTs, lactic, with blood cultures positive for e. Coli bacteremia.  CT a/p showing acute cholelithiasis and choledocholithiasis with intrahepatic and extrahepatic biliary ductal dilation.  Pt with worsening septic shock requiring peripheral NE.  CCS and IR consulted, pt to be transferred to [facility] for percutaneous cholecystostomy drain given hemodynamic instability, not a surgical candidate currently.  PCCM to admit.   Pertinent  Medical History  BPH with urinary retention, HLD, thyroid disease, PUD/ GERD, Cdiff (2009), duodenitis,   Significant Hospital Events: Including procedures, antibiotic start and stop dates in addition to other pertinent events   1/27 admit [facility] 1/28 tx to [facility]  Interim History / Subjective:   Objective    Blood pressure (!) 83/44, pulse 79, temperature 99.7 F (37.6 C), temperature source Rectal, resp. rate 20, height 5' 7 (1.702 m), weight 52.9 kg, SpO2 100%.    FiO2 (%):  [36 %] 36 %   Intake/Output Summary (Last 24 hours) at 09/14/2024 1341 Last data filed at 09/14/2024 1331 Gross per 24 hour  Intake 1047.02 ml  Output 90 ml  Net 957.02 ml   Filed Weights   09/13/24 1730 09/14/24 0250  Weight: 54 kg 52.9 kg    Examination: General:  Frail appearing thin elderly male in bed in NAD HEENT: MM pink/dry, poor dentition, pupils 3/r, low JVP Neuro: Aox4, MAE CV: rr, NSR, +2 radials PULM:  non labored, clear GI: soft, bs hypo, mild tenderness  RUQ Extremities: warm/dry, no LE edema  Skin: no rashes   K 3.4, bicarb 16, sCr 0.89, phos 2.3, ALP 755, albumin  2.9, AST/ ALT 3654/ 1217, t. Bili 1.9, lactic 3.1> 6.1, WBC 11.1> 3.6, Hgb 12.3, TSH 15.6, FT4   Resolved problem list   Assessment and Plan   Septic shock Acute cholangitis E. Coli bacteremia  Suspected UTI Lactic acidosis  Transaminitis  Hypophosphatemia Hypokalemia  Hypothyroidism PAF with RVR, CHAD2S@-VASC 2 Prolonged QTc P:  - cont NE for MAP goal > 65 - currently remains in SR, avoid Qtc prolonging measures  - CVL to be placed - trend lactic - additional LR bolus 1.5L - IR and CCS aware of pt, plans for IR perc chole drain - GI consult in am - NPO - abd US  pending - cont zosyn  per pharmacy - PPI - trend WBC/ fever curve - strict I/Os - follow LFTs/ renal indices/ coags - recheck renal panel and mag now, replete electrolytes prn - discussed with pt and son at bedside, pt desire full code for now but no prolonged measures    Labs   CBC: Recent Labs  Lab 09/13/24 1756 09/14/24 0309  WBC 11.1* 3.6*  NEUTROABS 9.0*  --   HGB 12.0* 12.3*  HCT 37.3* 39.3  MCV 90.8 91.4  PLT 418* 351    Basic Metabolic Panel: Recent Labs  Lab 09/13/24 1756 09/14/24 0309 09/14/24 0544  NA 137 139  --   K 3.8 3.4*  --   CL  101 106  --   CO2 20* 16*  --   GLUCOSE 104* 100*  --   BUN 29* 27*  --   CREATININE 0.84 0.89  --   CALCIUM  8.7* 8.2*  --   MG 2.1  --  1.8  PHOS  --  2.3*  --    GFR: Estimated Creatinine Clearance: 42.1 mL/min (by C-G formula based on SCr of 0.89 mg/dL). Recent Labs  Lab 09/13/24 1756 09/14/24 0309 09/14/24 0544  WBC 11.1* 3.6*  --   LATICACIDVEN 1.5 3.1* 6.1*    Liver Function Tests: Recent Labs  Lab 09/13/24 1756 09/14/24 0309  AST 812* 3,654*  ALT 210* 1,217*  ALKPHOS 422* 755*  BILITOT 1.0 1.9*  PROT 6.9 6.1*  ALBUMIN  3.3* 2.9*   Recent Labs  Lab 09/13/24 1756 09/14/24 0544  LIPASE 13 <10*   No  results for input(s): AMMONIA in the last 168 hours.  ABG    Component Value Date/Time   HCO3 25.4 09/13/2024 1759   O2SAT 39.7 09/13/2024 1759     Coagulation Profile: Recent Labs  Lab 09/14/24 0805  INR 1.5*    Cardiac Enzymes: No results for input(s): CKTOTAL, CKMB, CKMBINDEX, TROPONINI in the last 168 hours.  HbA1C: Hgb A1c MFr Bld  Date/Time Value Ref Range Status  01/03/2009 05:33 AM  4.6 - 6.1 % Final   5.4 (NOTE) The ADA recommends the following therapeutic goal for glycemic control related to Hgb A1c measurement: Goal of therapy: <6.5 Hgb A1c  Reference: American Diabetes Association: Clinical Practice Recommendations 2010, Diabetes Care, 2010, 33: (Suppl  1).    CBG: No results for input(s): GLUCAP in the last 168 hours.  Review of Systems:   Review of Systems  Constitutional:  Positive for malaise/fatigue.  Respiratory:  Negative for cough, sputum production and shortness of breath.   Cardiovascular:  Negative for chest pain.  Gastrointestinal:  Positive for abdominal pain, nausea and vomiting.  Neurological:  Negative for focal weakness.   Past Medical History:  He,  has a past medical history of Hypercholesteremia, Thyroid disease, and Urinary retention.   Surgical History:   Past Surgical History:  Procedure Laterality Date   BACK SURGERY     sciatic nerve    COLONOSCOPY  10/2009   Dr. Harvey: frequent diverticula, pathology with lymphocytic colitis. Large internal hemorrhoids.    FLEXIBLE SIGMOIDOSCOPY N/A 11/12/2016   Procedure: FLEXIBLE SIGMOIDOSCOPY;  Surgeon: Benjamin Benjamin Hollingshead, MD;  Location[facility];  Service: Endoscopy;  Laterality: N/A;   TRANSURETHRAL RESECTION OF BLADDER TUMOR Bilateral 10/31/2014   Procedure: CYSTO, BLADDER BIOPSY/CLOT EVACUATION, , BILATERAL RETROGRADE PYELOGRAM,BILATERAL URETERAL STENT PLACEMENT;  Surgeon: Benjamin Likens, MD;  Location: [facility];  Service: Urology;  Laterality: Bilateral;     Social  History:   reports that he has never smoked. He has never used smokeless tobacco. He reports that he does not drink alcohol and does not use drugs.   Family History:  His family history is negative for Colon cancer.   Allergies Allergies[1]   Home Medications  Prior to Admission medications  Medication Sig Start Date End Date Taking? Authorizing Provider  tamsulosin  (FLOMAX ) 0.4 MG CAPS capsule TAKE 1 CAPSULE (0.4 MG TOTAL) BY MOUTH IN THE MORNING . 08/28/22   Watt Rush, MD         CRITICAL CARE Performed by: Lyle Pesa   Total critical care time: 40 minutes  Critical care time was exclusive of separately billable procedures and treating  other patients.  Critical care was necessary to treat or prevent imminent or life-threatening deterioration.  Critical care was time spent personally by me on the following activities: development of treatment plan with patient and/or surrogate as well as nursing, discussions with consultants, evaluation of patient's response to treatment, examination of patient, obtaining history from patient or surrogate, ordering and performing treatments and interventions, ordering and review of laboratory studies, ordering and review of radiographic studies, pulse oximetry and re-evaluation of patient's condition.    Lyle Pesa, NP [facility] 09/14/2024, 5:23 PM  See Amion for pager If no response to pager , please call [phone] until 7pm After 7:00 pm call Elink  [phone]           [1] No Known Allergies  "

## 2024-09-14 NOTE — Progress Notes (Addendum)
"  ° °      Overnight   NAME: Benjamin [name] MRN: [id number] DOB : 1933/03/08    Date of Service   09/14/2024   HPI/Events of Note           Overnight   NAME: [name] MRN: [id number] DOB : 16-Oct-1932    Date of Service   09/14/2024   HPI/Events of Note    Notified by RN for continued hypotension/borderline hypotension/hyperthermia.   Brief History 89 year old male with no significant medical history admitted through the ER with 2-week onset of flulike symptoms. Complain today of abdominal pain, vomiting. Originally on medical floor for cholelithiasis choledocholithiasis and intrahepatic and extrahepatic biliary ductal dilation with suspicion for acute cholecystitis.  Pulmonary embolism ruled out. General surgery is consulted.  Patient was found to be hypothermic, tachycardic on the floor by Attending just at time of admission to the floor and moved to stepdown/ICU.  Bedside Visit Currently, patient has had fluid boluses by attending at 30 cc/kg. Albumin  has been ordered and is currently infusing. Patient has been on cooling blanket and now has temps 102 F down from 104F  Levophed  is now ordered to maintain MAP 65 or greater.  Patient blood pressure is currently repeatable at 85-90 systolic.  Potassium correction is in progress.   Interventions/ Plan   Levophed  to maintain MAP 65 or greater Potassium Correction is ordered. Continue all previous attending orders Surgery is consulted.             Update 0649 Hrs    Latest Reference Range & Units 09/14/24 03:09  Sodium 135 - 145 mmol/L 139  Potassium 3.5 - 5.1 mmol/L 3.4 (L)  Chloride 98 - 111 mmol/L 106  CO2 22 - 32 mmol/L 16 (L)  Glucose 70 - 99 mg/dL 899 (H)  BUN 8 - 23 mg/dL 27 (H)  Creatinine 9.38 - 1.24 mg/dL 9.10  Calcium  8.9 - 10.3 mg/dL 8.2 (L)  Anion gap 5 - 15  18 (H)  Phosphorus 2.5 - 4.6 mg/dL 2.3 (L)  Alkaline Phosphatase 38 - 126 U/L 755 (H)  Albumin  3.5 - 5.0 g/dL 2.9 (L)   AST 15 - 41 U/L 3,654 (H)  ALT 0 - 44 U/L 1,217 (H)  Total Protein 6.5 - 8.1 g/dL 6.1 (L)  Total Bilirubin 0.0 - 1.2 mg/dL 1.9 (H)  GFR, Estimated >60 mL/min >60  (L): Data is abnormally low (H): Data is abnormally high   Latest Reference Range & Units 09/13/24 17:56 09/14/24 03:09  Lactic Acid, Venous 0.5 - 1.9 mmol/L 1.5 3.1 (HH)  (HH): Data is critically high  This Lab value is drawn just before the bolus' ...    Lynwood Kipper BSN MSNA MSN ACNPC-AG Acute Care Nurse Practitioner [facility] [place]  "

## 2024-09-14 NOTE — Procedures (Signed)
 Vascular and Interventional Radiology Procedure Note  Patient: Benjamin [name] DOB: 12/01/1932 Medical Record Number: [id number] Note Date/Time: 09/14/24 5:33 PM   Performing Physician: Thom Hall, MD Assistant(s): None  Diagnosis: Acute cholecystitis. Q ascending cholangiitis.  Procedure:  CHOLECYSTOSTOMY TUBE PLACEMENT ANTEROGRADE CHOLANGIOGRAM  Anesthesia: Conscious Sedation Complications: None Estimated Blood Loss: Minimal Specimens:  None  Findings:  Successful placement of 78F cholecystostomy tube.  Plan: Flush tube w 5 mL sterile NS q8h and record drain output qShift. Follow up for routine tube evaluation in 6-8 week(s).   See detailed procedure note with images in PACS. The patient tolerated the procedure well without incident or complication and was returned to Recovery in stable condition.    Thom Hall, MD [facility] Radiology   Pager. [phone] Clinic. [phone]

## 2024-09-14 NOTE — Progress Notes (Signed)
 Discussed patient with Dr. Adina Bury, [facility], who is aware of the patient being transferred to [facility] Critical [facility].

## 2024-09-14 NOTE — Progress Notes (Signed)
 09/14/2024 Case d/w IR on call.  Rolan Sharps MD PCCM

## 2024-09-14 NOTE — Progress Notes (Signed)
 eLink Physician-Brief Progress Note Patient Name: Benjamin [name] DOB: April 05, 1933 MRN: [id number]   Date of Service  09/14/2024  HPI/Events of Note  89 yr old male in ICU Acute cholecystitis with cholelithiasis CT abdomen and pelvis suspicious for acute cholecystitis Acute respiratory failure with hypoxia Continue supplemental oxygen to maintain O2 sat > 92% P afib Elevated ast/alt from above Hiatal hernia  Camera: On nasal o2. Sinus tachycardia 120's. Sats 100% MAP 79 Able to protect his airways Cough +  Data: Reviewed Hypokalemia, normal creatinine AST/ALT 3654/1217, ALP 755. TB 1.9 LA 3.1 Leukopenia, trop 11plt normal.  CTA chest large hiatal hernia, no PE Covid/flu neg EKG sinus, prolonged qtc.   eICU Interventions  - keep mag/k over 2/4 respectively, replace if low Aspiration precautions On antibiotics, PPI. NPO S/p fluid boluses  SCD Pain control.       Intervention Category Major Interventions: Sepsis - evaluation and management Evaluation Type: New Patient Evaluation  [name] 09/14/2024, 4:45 AM

## 2024-09-14 NOTE — Progress Notes (Signed)
 During patient assessment charge nurse listened to Heart and pts Heart Rhythm was irregular, pt was admitted Med/Surg and I messaged MD to request cardiac monitoring, order was placed and upon putting tele box on pt. HR was found to be in the 150's-160's, MD notified, Pts oxygen decreased to the 80's on 4Ls of O2. I then turned oxygen up to 6Ls and pts O2 sats improved to 92. Pt was lethargic and Febrile, temp was checked and was 101.6, Tylenol  administered with no temp change. Pt started to decline and MD arrived at bedside, gave verbal orders for 3 x551ml bolus over 1.5 hours. Called family and updated them on pt status. After 1liter of NS administered pt showed not signs of improvement, temp climbed to 102.6, BPs checked-98/55, HR in the 150's. MD notified, and orders placed to transfer to ICU.

## 2024-09-14 NOTE — Progress Notes (Signed)
 PHARMACY - PHYSICIAN COMMUNICATION CRITICAL VALUE ALERT - BLOOD CULTURE IDENTIFICATION (BCID)  Benjamin [name] is an 89 y.o. male who presented to [facility] on 09/13/2024 with a chief complaint of abdominal pain and vomiting  Assessment:  Patient now with acute cholecystitis and bacteremia with ecoli.   Name of physician (or Provider) Contacted: [place]  Current antibiotics: zosyn   Changes to prescribed antibiotics recommended:  Recommendations declined by provider - infection may be polymicrobial, continue zosyn  for now.   Results for orders placed or performed during the hospital encounter of 09/13/24  Blood Culture ID Panel (Reflexed) (Collected: 09/13/2024  6:13 PM)  Result Value Ref Range   Enterococcus faecalis NOT DETECTED NOT DETECTED   Enterococcus Faecium NOT DETECTED NOT DETECTED   Listeria monocytogenes NOT DETECTED NOT DETECTED   Staphylococcus species NOT DETECTED NOT DETECTED   Staphylococcus aureus (BCID) NOT DETECTED NOT DETECTED   Staphylococcus epidermidis NOT DETECTED NOT DETECTED   Staphylococcus lugdunensis NOT DETECTED NOT DETECTED   Streptococcus species NOT DETECTED NOT DETECTED   Streptococcus agalactiae NOT DETECTED NOT DETECTED   Streptococcus pneumoniae NOT DETECTED NOT DETECTED   Streptococcus pyogenes NOT DETECTED NOT DETECTED   A.calcoaceticus-baumannii NOT DETECTED NOT DETECTED   Bacteroides fragilis NOT DETECTED NOT DETECTED   Enterobacterales DETECTED (A) NOT DETECTED   Enterobacter cloacae complex NOT DETECTED NOT DETECTED   Escherichia coli DETECTED (A) NOT DETECTED   Klebsiella aerogenes NOT DETECTED NOT DETECTED   Klebsiella oxytoca NOT DETECTED NOT DETECTED   Klebsiella pneumoniae NOT DETECTED NOT DETECTED   Proteus species NOT DETECTED NOT DETECTED   Salmonella species NOT DETECTED NOT DETECTED   Serratia marcescens NOT DETECTED NOT DETECTED   Haemophilus influenzae NOT DETECTED NOT DETECTED   Neisseria meningitidis NOT DETECTED NOT  DETECTED   Pseudomonas aeruginosa NOT DETECTED NOT DETECTED   Stenotrophomonas maltophilia NOT DETECTED NOT DETECTED   Candida albicans NOT DETECTED NOT DETECTED   Candida auris NOT DETECTED NOT DETECTED   Candida glabrata NOT DETECTED NOT DETECTED   Candida krusei NOT DETECTED NOT DETECTED   Candida parapsilosis NOT DETECTED NOT DETECTED   Candida tropicalis NOT DETECTED NOT DETECTED   Cryptococcus neoformans/gattii NOT DETECTED NOT DETECTED   CTX-M ESBL NOT DETECTED NOT DETECTED   Carbapenem resistance IMP NOT DETECTED NOT DETECTED   Carbapenem resistance KPC NOT DETECTED NOT DETECTED   Carbapenem resistance NDM NOT DETECTED NOT DETECTED   Carbapenem resist OXA 48 LIKE NOT DETECTED NOT DETECTED   Carbapenem resistance VIM NOT DETECTED NOT DETECTED   Dempsey Blush PharmD., BCPS Clinical Pharmacist 09/14/2024 1:29 PM

## 2024-09-14 NOTE — Plan of Care (Addendum)
 Received from [facility] ICU. CVC placed by Dr Claudene. Transported to IR for drain. Levo and Vaso, 1500 LR bolus given. Post IR, pt o2 sat reading 80% [place] with good pleath and site changed to achieve this titrated to 6L, RT notified, placed on NRM and then added salter to NRM d/t persistent o2 sat in 70s-80s. Ground team called ABG collected.   Problem: Education: Goal: Knowledge of General Education information will improve Description: Including pain rating scale, medication(s)/side effects and non-pharmacologic comfort measures Outcome: Progressing   Problem: Health Behavior/Discharge Planning: Goal: Ability to manage health-related needs will improve Outcome: Progressing   Problem: Clinical Measurements: Goal: Ability to maintain clinical measurements within normal limits will improve Outcome: Progressing Goal: Will remain free from infection Outcome: Progressing Goal: Diagnostic test results will improve Outcome: Progressing Goal: Respiratory complications will improve Outcome: Progressing Goal: Cardiovascular complication will be avoided Outcome: Progressing   Problem: Activity: Goal: Risk for activity intolerance will decrease Outcome: Progressing   Problem: Nutrition: Goal: Adequate nutrition will be maintained Outcome: Progressing   Problem: Coping: Goal: Level of anxiety will decrease Outcome: Progressing   Problem: Elimination: Goal: Will not experience complications related to bowel motility Outcome: Progressing Goal: Will not experience complications related to urinary retention Outcome: Progressing   Problem: Pain Managment: Goal: General experience of comfort will improve and/or be controlled Outcome: Progressing   Problem: Safety: Goal: Ability to remain free from injury will improve Outcome: Progressing   Problem: Skin Integrity: Goal: Risk for impaired skin integrity will decrease Outcome: Progressing

## 2024-09-14 NOTE — Procedures (Signed)
 Central Venous Catheter Insertion Procedure Note  Benjamin [name]  [id number]  1932-10-27  Date:09/14/24  Time:5:27 PM   Provider Performing:Benjamin [name] Benjamin [name]   Procedure: Insertion of Non-tunneled Central Venous [phone]) with US  guidance (23062)   Indication(s) Medication administration  Consent Risks of the procedure as well as the alternatives and risks of each were explained to the patient and/or caregiver.  Consent for the procedure was obtained and is signed in the bedside chart  Anesthesia Topical only with 1% lidocaine    Timeout Verified patient identification, verified procedure, site/side was marked, verified correct patient position, special equipment/implants available, medications/allergies/relevant history reviewed, required imaging and test results available.  Sterile Technique Maximal sterile technique including full sterile barrier drape, hand hygiene, sterile gown, sterile gloves, mask, hair covering, sterile ultrasound probe cover (if used).  Procedure Description Area of catheter insertion was cleaned with chlorhexidine  and draped in sterile fashion.  With real-time ultrasound guidance a central venous catheter was placed into the left internal jugular vein. Nonpulsatile blood flow and easy flushing noted in all ports.  The catheter was sutured in place and sterile dressing applied.  Complications/Tolerance None; patient tolerated the procedure well. Chest X-ray is ordered to verify placement for internal jugular or subclavian cannulation.   Chest x-ray is not ordered for femoral cannulation.  EBL Minimal  Specimen(s) None

## 2024-09-14 NOTE — Progress Notes (Signed)
 eLink Physician-Brief Progress Note Patient Name: Benjamin [name] DOB: [birth year] MRN: [id number]   Date of Service  09/14/2024  HPI/Events of Note  Called to camera in due to desaturation after coming back from radiology was placed on non rebreather mask and then heated HFNC.  Seen not in distress at this time.  eICU Interventions  Bedside CCM team went to assess patient and it seems to be a pulse ox issue and was able to wean patient back down to nasal cannula.     Intervention Category Major Interventions: Hypoxemia - evaluation and management  [name] 09/14/2024, 7:35 PM

## 2024-09-14 NOTE — Progress Notes (Signed)
 OT Cancellation Note  Patient Details Name: Benjamin [name] MRN: [id number] DOB: 1932/09/17   Cancelled Treatment:    Reason Eval/Treat Not Completed: Medical issues which prohibited therapy. RN requested hold on pt due to not being medically ready. Will attempt evaluation later as time permits and pt becomes medically ready.   Marysa Wessner OT, MOT   Jayson Person 09/14/2024, 9:06 AM

## 2024-09-14 NOTE — Consult Note (Addendum)
 09/14/2024 Seen/examined, d/w APP and agree with their consult note which is to follow.  Cholangitis with MSOF; possible concurrent UTI Pressor needs mild, shock liver noted Frail but mentating Bounding pulses; mild TTP RUQ Looks dry Will give additional IVF and start a bicarb gtt Place CVL Abx as ordered Not best candidate for anything other than percutaneous drainage and giving time for abx to take effect then can consider ERCP; GI consult in am, our CCS team is aware and will see in am Family would like him full code for now  My cc time 14 mins Rolan Sharps MD PCCM

## 2024-09-14 NOTE — Progress Notes (Signed)
" °  Transition of Care [facility]) Screening Note   Patient Details  Name: Benjamin [name] Date of Birth: 08-26-1932   Transition of Care [facility]) CM/SW Contact:    [name] [name], LCSW Phone Number: 09/14/2024, 12:44 PM    Transition of Care Department [facility]) has reviewed patient and no TOC needs have been identified at this time. We will continue to monitor patient advancement through interdisciplinary progression rounds. If new patient transition needs arise, please place a TOC consult.    09/14/24 1244  TOC Brief Assessment  Insurance and Status Reviewed  Patient has primary care physician No (PCP list added to AVS)  Home environment has been reviewed From home  Prior level of function: Independent/modified independent  Prior/Current Home Services No current home services  Social Drivers of Health Review SDOH reviewed no interventions necessary  Readmission risk has been reviewed Yes  Transition of care needs transition of care needs identified, TOC will continue to follow    "

## 2024-09-14 NOTE — Consult Note (Signed)
 Reason for Consult: acute cholecystitis Referring Physician: [name] is a 89 y.o. male with hypothyroidism and limited PMH due to lack of medical visitation who presented to the ED via EMS for postprandial abdominal pain and vomiting.   HPI: He reports 2 weeks of weakness, subjective fevers, shaking chills, liquid brown stools, and decreased PO intake. After eating yesterday, he reportedly experienced abdominal pain and vomited one hour later, prompting call to EMS.   Past Medical History:  Diagnosis Date   Hypercholesteremia    Thyroid disease    Urinary retention     Past Surgical History:  Procedure Laterality Date   BACK SURGERY     sciatic nerve    COLONOSCOPY  10/2009   Dr. Harvey: frequent diverticula, pathology with lymphocytic colitis. Large internal hemorrhoids.    FLEXIBLE SIGMOIDOSCOPY N/A 11/12/2016   Procedure: FLEXIBLE SIGMOIDOSCOPY;  Surgeon: Benjamin CHRISTELLA Hollingshead, MD;  Location: [facility];  Service: Endoscopy;  Laterality: N/A;   TRANSURETHRAL RESECTION OF BLADDER TUMOR Bilateral 10/31/2014   Procedure: CYSTO, BLADDER BIOPSY/CLOT EVACUATION, , BILATERAL RETROGRADE PYELOGRAM,BILATERAL URETERAL STENT PLACEMENT;  Surgeon: Benjamin Likens, MD;  Location: [facility];  Service: Urology;  Laterality: Bilateral;    Family History  Problem Relation Age of Onset   Colon cancer Neg Hx     Social History:  reports that he has never smoked. He has never used smokeless tobacco. He reports that he does not drink alcohol and does not use drugs.  Allergies: Allergies[1]  Medications: I have reviewed the patient's current medications.  Results for orders placed or performed during the hospital encounter of 09/13/24 (from the past 48 hours)  Resp panel by RT-PCR (RSV, Flu A&B, Covid) Anterior Nasal Swab     Status: None   Collection Time: 09/13/24  5:51 PM   Specimen: Anterior Nasal Swab  Result Value Ref Range   SARS Coronavirus 2 by RT PCR NEGATIVE NEGATIVE     Comment: (NOTE) SARS-CoV-2 target nucleic acids are NOT DETECTED.  The SARS-CoV-2 RNA is generally detectable in upper respiratory specimens during the acute phase of infection. The lowest concentration of SARS-CoV-2 viral copies this assay can detect is 138 copies/mL. A negative result does not preclude SARS-Cov-2 infection and should not be used as the sole basis for treatment or other patient management decisions. A negative result may occur with  improper specimen collection/handling, submission of specimen other than nasopharyngeal swab, presence of viral mutation(s) within the areas targeted by this assay, and inadequate number of viral copies(<138 copies/mL). A negative result must be combined with clinical observations, patient history, and epidemiological information. The expected result is Negative.  Fact Sheet for Patients:  bloggercourse.com  Fact Sheet for Healthcare Providers:  seriousbroker.it  This test is no t yet approved or cleared by the [place]  FDA and  has been authorized for detection and/or diagnosis of SARS-CoV-2 by FDA under an Emergency Use Authorization (EUA). This EUA will remain  in effect (meaning this test can be used) for the duration of the COVID-19 declaration under Section 564(b)(1) of the Act, 21 U.S.C.section 360bbb-3(b)(1), unless the authorization is terminated  or revoked sooner.       Influenza A by PCR NEGATIVE NEGATIVE   Influenza B by PCR NEGATIVE NEGATIVE    Comment: (NOTE) The Xpert Xpress SARS-CoV-2/FLU/RSV plus assay is intended as an aid in the diagnosis of influenza from Nasopharyngeal swab specimens and should not be used as a sole basis for treatment. Nasal washings and  aspirates are unacceptable for Xpert Xpress SARS-CoV-2/FLU/RSV testing.  Fact Sheet for Patients: bloggercourse.com  Fact Sheet for Healthcare  Providers: seriousbroker.it  This test is not yet approved or cleared by the [place]  FDA and has been authorized for detection and/or diagnosis of SARS-CoV-2 by FDA under an Emergency Use Authorization (EUA). This EUA will remain in effect (meaning this test can be used) for the duration of the COVID-19 declaration under Section 564(b)(1) of the Act, 21 U.S.C. section 360bbb-3(b)(1), unless the authorization is terminated or revoked.     Resp Syncytial Virus by PCR NEGATIVE NEGATIVE    Comment: (NOTE) Fact Sheet for Patients: bloggercourse.com  Fact Sheet for Healthcare Providers: seriousbroker.it  This test is not yet approved or cleared by the [place]  FDA and has been authorized for detection and/or diagnosis of SARS-CoV-2 by FDA under an Emergency Use Authorization (EUA). This EUA will remain in effect (meaning this test can be used) for the duration of the COVID-19 declaration under Section 564(b)(1) of the Act, 21 U.S.C. section 360bbb-3(b)(1), unless the authorization is terminated or revoked.  Performed at [facility], [address] [place], [place] [address]   Comprehensive metabolic panel     Status: Abnormal   Collection Time: 09/13/24  5:56 PM  Result Value Ref Range   Sodium 137 135 - 145 mmol/L    Comment: Electrolytes repeated to verify    Potassium 3.8 3.5 - 5.1 mmol/L   Chloride 101 98 - 111 mmol/L   CO2 20 (L) 22 - 32 mmol/L   Glucose, Bld 104 (H) 70 - 99 mg/dL    Comment: Glucose reference range applies only to samples taken after fasting for at least 8 hours.   BUN 29 (H) 8 - 23 mg/dL   Creatinine, Ser 9.15 0.61 - 1.24 mg/dL   Calcium  8.7 (L) 8.9 - 10.3 mg/dL   Total Protein 6.9 6.5 - 8.1 g/dL   Albumin  3.3 (L) 3.5 - 5.0 g/dL   AST 187 (H) 15 - 41 U/L   ALT 210 (H) 0 - 44 U/L   Alkaline Phosphatase 422 (H) 38 - 126 U/L   Total Bilirubin 1.0 0.0 - 1.2 mg/dL   GFR,  Estimated >39 >39 mL/min    Comment: (NOTE) Calculated using the CKD-EPI Creatinine Equation (2021)    Anion gap 17 (H) 5 - 15    Comment: Performed at [facility], [address] [place], [place] [address]  Lactic acid, plasma     Status: None   Collection Time: 09/13/24  5:56 PM  Result Value Ref Range   Lactic Acid, Venous 1.5 0.5 - 1.9 mmol/L    Comment: Performed at [facility], [address], [place], [place] [address]  Lipase, blood     Status: None   Collection Time: 09/13/24  5:56 PM  Result Value Ref Range   Lipase 13 11 - 51 U/L    Comment: Performed at [facility], [address], [place], [place] [address]  Troponin T, High Sensitivity     Status: None   Collection Time: 09/13/24  5:56 PM  Result Value Ref Range   Troponin T High Sensitivity 11 0 - 19 ng/L    Comment: (NOTE) Biotin concentrations > 1000 ng/mL falsely decrease TnT results.  Serial cardiac troponin measurements are suggested.  Refer to the Links section for chest pain algorithms and additional  guidance. Performed at [facility], [address], [place], [place] [address]   CBC with Differential  Status: Abnormal   Collection Time: 09/13/24  5:56 PM  Result Value Ref Range   WBC 11.1 (H) 4.0 - 10.5 K/uL   RBC 4.11 (L) 4.22 - 5.81 MIL/uL   Hemoglobin 12.0 (L) 13.0 - 17.0 g/dL   HCT 62.6 (L) 60.9 - 47.9 %   MCV 90.8 80.0 - 100.0 fL   MCH 29.2 26.0 - 34.0 pg   MCHC 32.2 30.0 - 36.0 g/dL   RDW 86.1 88.4 - 84.4 %   Platelets 418 (H) 150 - 400 K/uL   nRBC 0.0 0.0 - 0.2 %   Neutrophils Relative % 81 %   Neutro Abs 9.0 (H) 1.7 - 7.7 K/uL   Lymphocytes Relative 9 %   Lymphs Abs 1.0 0.7 - 4.0 K/uL   Monocytes Relative 9 %   Monocytes Absolute 1.0 0.1 - 1.0 K/uL   Eosinophils Relative 0 %   Eosinophils Absolute 0.0 0.0 - 0.5 K/uL   Basophils Relative 0 %   Basophils Absolute 0.0 0.0 - 0.1 K/uL   Immature Granulocytes 1 %   Abs Immature Granulocytes 0.06 0.00 - 0.07 K/uL    Comment: Performed  at [facility], [address] [place], [place] [address]  Pro Brain natriuretic peptide     Status: Abnormal   Collection Time: 09/13/24  5:56 PM  Result Value Ref Range   Pro Brain Natriuretic Peptide 778.0 (H) <300.0 pg/mL    Comment: (NOTE) Age Group        Cut-Points    Interpretation  < 50 years     450 pg/mL       NT-proBNP > 450 pg/mL indicates                                ADHF is likely              50 to 75 years  900 pg/mL      NT-proBNP > 900 pg/mL indicates          ADHF is likely  > 75 years      1800 pg/mL     NT-proBNP > 1800 pg/mL indicates          ADHF is likely                           All ages    Results between       Indeterminate. Further clinical             300 and the cut-   information is needed to determine            point for age group   if ADHF is present.                                                             Elecsys proBNP II/ Elecsys proBNP II STAT           Cut-Point                       Interpretation  300 pg/mL  NT-proBNP <300pg/mL indicates                             ADHF is not likely  Performed at [facility], [address], [place], [place] [address]   Magnesium      Status: None   Collection Time: 09/13/24  5:56 PM  Result Value Ref Range   Magnesium  2.1 1.7 - 2.4 mg/dL    Comment: Performed at [facility], [address] [place], [place] [address]  TSH     Status: Abnormal   Collection Time: 09/13/24  5:56 PM  Result Value Ref Range   TSH 15.600 (H) 0.350 - 4.500 uIU/mL    Comment: Performed at [facility], [address] [place], [place] [address]  Blood gas, venous     Status: Abnormal   Collection Time: 09/13/24  5:59 PM  Result Value Ref Range   pH, Ven 7.38 7.25 - 7.43   pCO2, Ven 43 (L) 44 - 60 mmHg   pO2, Ven <31 (LL) 32 - 45 mmHg    Comment: CRITICAL RESULT CALLED TO, READ BACK BY AND VERIFIED WITH: POWELL, J ON 09/13/24 AT 1900 BY LOY, C    Bicarbonate 25.4 20.0 - 28.0 mmol/L    Acid-Base Excess 0.3 0.0 - 2.0 mmol/L   O2 Saturation 39.7 %   Patient temperature 37.7    Collection site RIGHT ANTECUBITAL    Drawn by [id number]     Comment: Performed at [facility], [address] [place], [place] [address]  Culture, blood (routine x 2)     Status: None (Preliminary result)   Collection Time: 09/13/24  6:13 PM   Specimen: BLOOD  Result Value Ref Range   Specimen Description BLOOD BLOOD LEFT ARM    Special Requests      BOTTLES DRAWN AEROBIC AND ANAEROBIC Blood Culture adequate volume   Culture      NO GROWTH < 12 HOURS Performed at [facility], [address] [place], [place] [address]    Report Status PENDING   Culture, blood (routine x 2)     Status: None (Preliminary result)   Collection Time: 09/13/24  6:13 PM   Specimen: BLOOD  Result Value Ref Range   Specimen Description BLOOD LEFT ANTECUBITAL    Special Requests      BOTTLES DRAWN AEROBIC AND ANAEROBIC Blood Culture adequate volume   Culture      NO GROWTH < 12 HOURS Performed at [facility], [address], [place], [place] [address]    Report Status PENDING   Troponin T, High Sensitivity     Status: None   Collection Time: 09/13/24  8:13 PM  Result Value Ref Range   Troponin T High Sensitivity 11 0 - 19 ng/L    Comment: (NOTE) Biotin concentrations > 1000 ng/mL falsely decrease TnT results.  Serial cardiac troponin measurements are suggested.  Refer to the Links section for chest pain algorithms and additional  guidance. Performed at [facility], [address], [place], [place] [address]   Comprehensive metabolic panel     Status: Abnormal   Collection Time: 09/14/24  3:09 AM  Result Value Ref Range   Sodium 139 135 - 145 mmol/L   Potassium 3.4 (L) 3.5 - 5.1 mmol/L   Chloride 106 98 - 111 mmol/L   CO2 16 (L) 22 - 32 mmol/L   Glucose, Bld 100 (H) 70 - 99 mg/dL    Comment:  Glucose reference range applies only to samples taken after fasting for at least 8 hours.   BUN 27 (H) 8 - 23 mg/dL    Creatinine, Ser 9.10 0.61 - 1.24 mg/dL   Calcium  8.2 (L) 8.9 - 10.3 mg/dL   Total Protein 6.1 (L) 6.5 - 8.1 g/dL   Albumin  2.9 (L) 3.5 - 5.0 g/dL   AST 6,345 (H) 15 - 41 U/L   ALT 1,217 (H) 0 - 44 U/L   Alkaline Phosphatase 755 (H) 38 - 126 U/L   Total Bilirubin 1.9 (H) 0.0 - 1.2 mg/dL   GFR, Estimated >39 >39 mL/min    Comment: (NOTE) Calculated using the CKD-EPI Creatinine Equation (2021)    Anion gap 18 (H) 5 - 15    Comment: Performed at [facility], [address], [place], [place] [address]  CBC     Status: Abnormal   Collection Time: 09/14/24  3:09 AM  Result Value Ref Range   WBC 3.6 (L) 4.0 - 10.5 K/uL   RBC 4.30 4.22 - 5.81 MIL/uL   Hemoglobin 12.3 (L) 13.0 - 17.0 g/dL   HCT 60.6 60.9 - 47.9 %   MCV 91.4 80.0 - 100.0 fL   MCH 28.6 26.0 - 34.0 pg   MCHC 31.3 30.0 - 36.0 g/dL   RDW 85.9 88.4 - 84.4 %   Platelets 351 150 - 400 K/uL   nRBC 0.0 0.0 - 0.2 %    Comment: Performed at [facility], [address], [place], [place] [address]  Phosphorus     Status: Abnormal   Collection Time: 09/14/24  3:09 AM  Result Value Ref Range   Phosphorus 2.3 (L) 2.5 - 4.6 mg/dL    Comment: Performed at [facility], [address], [place], [place] [address]  Lactic acid, plasma     Status: Abnormal   Collection Time: 09/14/24  3:09 AM  Result Value Ref Range   Lactic Acid, Venous 3.1 (HH) 0.5 - 1.9 mmol/L    Comment: Critical Value, Read Back and verified with WENDI Sprang [phone], Viray,J Performed at [facility], [address] [place], [place] [address]   Urinalysis, Routine w reflex microscopic -Urine, Clean Catch     Status: Abnormal   Collection Time: 09/14/24  4:50 AM  Result Value Ref Range   Color, Urine AMBER (A) YELLOW    Comment: BIOCHEMICALS MAY BE AFFECTED BY COLOR   APPearance HAZY (A) CLEAR   Specific Gravity, Urine 1.034 (H) 1.005 - 1.030   pH 5.0 5.0 - 8.0   Glucose, UA NEGATIVE NEGATIVE mg/dL   Hgb urine dipstick NEGATIVE NEGATIVE   Bilirubin Urine  NEGATIVE NEGATIVE   Ketones, ur 5 (A) NEGATIVE mg/dL   Protein, ur 30 (A) NEGATIVE mg/dL   Nitrite NEGATIVE NEGATIVE   Leukocytes,Ua MODERATE (A) NEGATIVE   RBC / HPF 0-5 0 - 5 RBC/hpf   WBC, UA >50 0 - 5 WBC/hpf   Bacteria, UA RARE (A) NONE SEEN   Squamous Epithelial / HPF 0-5 0 - 5 /HPF   Mucus PRESENT     Comment: Performed at [facility], [address], [place], [place] [address]  Lactic acid, plasma     Status: Abnormal   Collection Time: 09/14/24  5:44 AM  Result Value Ref Range   Lactic Acid, Venous 6.1 (HH) 0.5 - 1.9 mmol/L    Comment: Critical Value, Read Back and verified with KYM Linger [phone], Viray,J  Performed at [facility], [address], [place], [place] [address]   Magnesium      Status: None   Collection Time: 09/14/24  5:44 AM  Result Value Ref Range   Magnesium  1.8 1.7 - 2.4 mg/dL    Comment: Performed at [facility], [address] [place], [place] [address]    CT ABDOMEN PELVIS W CONTRAST Addendum Date: 09/13/2024  ADDENDUM #2 EXAM: CT ABDOMEN AND PELVIS WITH CONTRAST 09/13/2024 09:22:24 PM TECHNIQUE: CT of the abdomen and pelvis was performed with the administration of 100 mL iohexol  (OMNIPAQUE ) 350 MG/ML injection. Multiplanar reformatted images are provided for review. Automated exposure control, iterative reconstruction, and/or weight-based adjustment of the mA/kV was utilized to reduce the radiation dose to as low as reasonably achievable. COMPARISON: 11/10/2016 CLINICAL HISTORY: Abdominal pain, acute, nonlocalized; elevated LFTs. FINDINGS: LOWER CHEST: No acute abnormality. LIVER: There is evidence of intrahepatic biliary ductal dilatation. GALLBLADDER AND BILE DUCTS: The gallbladder is well distended with findings of wall thickening and mild pericholecystic inflammatory change. Dependent gallstones are noted within the gallbladder. There is evidence of extrahepatic biliary ductal dilatation with a stone in the distal common bile duct. Common bile duct  measures up to 14 mm distally. SPLEEN: No acute abnormality. PANCREAS: No acute abnormality. ADRENAL GLANDS: No acute abnormality. KIDNEYS, URETERS AND BLADDER: Kidneys demonstrate a normal enhancement pattern bilaterally. Renal cysts are noted bilaterally. Per consensus, no follow-up is needed for simple Bosniak type 1 and 2 renal cysts, unless the patient has a malignancy history or risk factors. No stones in the kidneys or ureters. No perinephric or periureteral stranding. The bladder is well distended with trabeculation, which may be related to a degree of bladder outlet obstruction. GI AND BOWEL: Inflammatory changes of the duodenum adjacent to the gallbladder are seen. Scattered diverticular change of the colon is noted without diverticulitis. The appendix is not well visualized and may have been surgically removed. No inflammatory changes to suggest appendicitis are seen. The small bowel and stomach are otherwise within normal limits. There is no bowel obstruction. PERITONEUM AND RETROPERITONEUM: No ascites. No free air. VASCULATURE: Aorta is normal in caliber. Aortic calcifications are seen without aneurysmal dilatation. LYMPH NODES: No lymphadenopathy. REPRODUCTIVE ORGANS: The prostate is within normal limits. BONES AND SOFT TISSUES: No acute osseous abnormality. No focal soft tissue abnormality. IMPRESSION: 1. Cholelithiasis and Choledocholithiasis with intrahepatic and extrahepatic biliary ductal dilatation. 2. Findings suspicious for acute cholecystitis. Ultrasound would be helpful for further evaluation. 3. Inflammatory changes of the adjacent duodenum. Due to a technical error an original report was put out at 9:34 . This will serve as the full and final dictation for this CT of the abdomen and pelvis. Electronically signed by: Oneil Devonshire MD 09/13/2024 09:49 PM EST RP Workstation: [id number]   Addendum Date: 09/13/2024 ADDENDUM #1 EXAM: CT ABDOMEN AND PELVIS WITH CONTRAST 09/13/2024 09:22:24 PM  TECHNIQUE: CT of the abdomen and pelvis was performed with the administration of 100 mL iohexol  (OMNIPAQUE ) 350 MG/ML injection. Multiplanar reformatted images are provided for review. Automated exposure control, iterative reconstruction, and/or weight-based adjustment of the mA/kV was utilized to reduce the radiation dose to as low as reasonably achievable. COMPARISON: 11/10/2016 CLINICAL HISTORY: Abdominal pain, acute, nonlocalized; elevated LFTs. FINDINGS: LOWER CHEST: No acute abnormality. LIVER: There is evidence of intrahepatic biliary ductal dilatation. GALLBLADDER AND BILE DUCTS: The gallbladder is well distended with findings of wall thickening and mild pericholecystic inflammatory change. Dependent gallstones are noted within the gallbladder. There is evidence of extrahepatic biliary ductal dilatation with a stone in  the distal common bile duct. Common bile duct measures up to 14 mm distally. SPLEEN: No acute abnormality. PANCREAS: No acute abnormality. ADRENAL GLANDS: No acute abnormality. KIDNEYS, URETERS AND BLADDER: Kidneys demonstrate a normal enhancement pattern bilaterally. Renal cysts are noted bilaterally. Per consensus, no follow-up is needed for simple Bosniak type 1 and 2 renal cysts, unless the patient has a malignancy history or risk factors. No stones in the kidneys or ureters. No perinephric or periureteral stranding. The bladder is well distended with trabeculation, which may be related to a degree of bladder outlet obstruction. GI AND BOWEL: Inflammatory changes of the duodenum adjacent to the gallbladder are seen. Scattered diverticular change of the colon is noted without diverticulitis. The appendix is not well visualized and may have been surgically removed. No inflammatory changes to suggest appendicitis are seen. The small bowel and stomach are otherwise within normal limits. There is no bowel obstruction. PERITONEUM AND RETROPERITONEUM: No ascites. No free air. VASCULATURE: Aorta  is normal in caliber. Aortic calcifications are seen without aneurysmal dilatation. LYMPH NODES: No lymphadenopathy. REPRODUCTIVE ORGANS: The prostate is within normal limits. BONES AND SOFT TISSUES: No acute osseous abnormality. No focal soft tissue abnormality. IMPRESSION: 1. Cholelithiasis and Choledocholithiasis with intrahepatic and extrahepatic biliary ductal dilatation. 2. Findings suspicious for acute cholecystitis. Ultrasound would be helpful for further evaluation. 3. Inflammatory changes of the adjacent duodenum. Due to a technical error an original report was put out at 9:03 . This will serve as the full and final dictation for this CT of the abdomen and pelvis. Electronically signed by: Oneil Devonshire MD 09/13/2024 09:49 PM EST RP Workstation: [id number]   Result Date: 09/13/2024 ORIGINAL REPORT EXAM: CT ABDOMEN AND PELVIS WITH CONTRAST 09/13/2024 09:22:24 PM TECHNIQUE: CT of the abdomen and pelvis was performed with the administration of intravenous contrast. Multiplanar reformatted images are provided for review. Automated exposure control, iterative reconstruction, and/or weight-based adjustment of the mA/kV was utilized to reduce the radiation dose to as low as reasonably achievable. COMPARISON: None available. CLINICAL HISTORY: Abdominal pain, acute, nonlocalized; elevated LFTs Abdominal pain, acute, nonlocalized; elevated liver function tests. FINDINGS: LOWER CHEST: No acute abnormality. LIVER: The liver is unremarkable. GALLBLADDER AND BILE DUCTS: Gallbladder is unremarkable. No biliary ductal dilatation. SPLEEN: No acute abnormality. PANCREAS: No acute abnormality. ADRENAL GLANDS: No acute abnormality. KIDNEYS, URETERS AND BLADDER: No stones in the kidneys or ureters. No hydronephrosis. No perinephric or periureteral stranding. Urinary bladder is unremarkable. GI AND BOWEL: Stomach demonstrates no acute abnormality. There is no bowel obstruction. PERITONEUM AND RETROPERITONEUM: No ascites. No  free air. VASCULATURE: Aorta is normal in caliber. LYMPH NODES: No lymphadenopathy. REPRODUCTIVE ORGANS: No acute abnormality. BONES AND SOFT TISSUES: No acute osseous abnormality. No focal soft tissue abnormality. IMPRESSION: 1. No acute findings in the abdomen or pelvis. Electronically signed by: Oneil Devonshire MD 09/13/2024 09:34 PM EST RP Workstation: [id number]   CT Angio Chest PE W/Cm &/Or Wo Cm Result Date: 09/13/2024 EXAM: CTA of the Chest with contrast for PE 09/13/2024 09:22:24 PM TECHNIQUE: CTA of the chest was performed after the administration of 100 mL of iohexol  (OMNIPAQUE ) 350 MG/ML injection. Multiplanar reformatted images are provided for review. MIP images are provided for review. Automated exposure control, iterative reconstruction, and/or weight based adjustment of the mA/kV was utilized to reduce the radiation dose to as low as reasonably achievable. COMPARISON: Plain film from earlier in the same day. CLINICAL HISTORY: Nausea and vomiting and weakness. FINDINGS: PULMONARY ARTERIES: The pulmonary artery shows a normal branching  pattern bilaterally. No filling defect to suggest pulmonary embolism is noted. Main pulmonary artery is normal in caliber. MEDIASTINUM: Atherosclerotic calcification of the aorta is noted. No aneurysmal dilatation is seen. Coronary calcifications are noted. The heart and pericardium demonstrate no acute abnormality. LYMPH NODES: No mediastinal, hilar or axillary lymphadenopathy. LUNGS AND PLEURA: The lungs demonstrate mild emphysematous change. Mild atelectatic changes in the right base are seen. No focal infiltrate or pulmonary edema. No pleural effusion or pneumothorax. UPPER ABDOMEN: Large hiatal hernia is seen. SOFT TISSUES AND BONES: No acute bone or soft tissue abnormality. IMPRESSION: 1. No pulmonary embolism. 2. Large hiatal hernia. Electronically signed by: Oneil Devonshire MD 09/13/2024 09:30 PM EST RP Workstation: [id number]   DG Chest Port 1 View Result Date:  09/13/2024 EXAM: 1 VIEW(S) XRAY OF THE CHEST 09/13/2024 06:25:54 PM COMPARISON: None available. CLINICAL HISTORY: Shortness of breath. FINDINGS: LUNGS AND PLEURA: Right retrocardiac opacity consistent with known hiatal hernia. No pleural effusion. No pneumothorax. HEART AND MEDIASTINUM: Aortic calcification. No acute abnormality of the cardiac and mediastinal silhouettes. BONES AND SOFT TISSUES: No acute osseous abnormality. IMPRESSION: 1. No acute findings. 2. Right retrocardiac opacity consistent with hiatal hernia. Electronically signed by: Elsie Gravely MD 09/13/2024 07:04 PM EST RP Workstation: [id number]    ROS:  Pertinent items noted in HPI and remainder of comprehensive ROS otherwise negative.  Blood pressure (!) 83/45, pulse 67, temperature (!) 103 F (39.4 C), temperature source Bladder, resp. rate (!) 21, height 5' 7 (1.702 m), weight 52.9 kg, SpO2 93%. Physical Exam:  General: Ill-appearing older gentleman in no acute distress lying comfortably in bed. CV: Tachycardic, irregular rate, normal S1/S2. Pulm: Breathing comfortably on room air. Lungs CTAB. Abd: Soft, mildly tender, non-distended abdomen. RUQ tenderness to deep palpation. Murphy sign negative. Skin: Anicteric.  Assessment/Plan: # Septic shock # Lactic acidosis (3.1 > 6.1) # Acute cholecystitis with cholelithiasis and choledocholithiasis # Acute cholangitis, [place] Grade III Postprandial N/V, RUQ tenderness, and CT evidence consistent with acute cholecystitis. Hemodynamically unstable, febrile, low WBC, biliary dilitation, and INR 1.5 consistent with [place] Grade III acute cholangitis. Patient is not a surgical candidate at this time due to hemodynamic instability, worsening lactic acidosis, and liver dysfunction 2/2 shock vs. hepatitis vs. other etiology. - Consider percutaneous biliary drainage when patient stabilizes (requires [facility] to [employer]) - Consider interval cholecystectomy after resolution of shock -  Continue IV Zosyn  per primary team - Continue IV hydration and trend lactic acid - Blood cultures pending  - RUQ US  pending  # Paroxysmal AF # Tachycardia, improving - Continue antipyretics and consider rate control per primary; appreciate their recommendations  # Elevated ALT/AST # Hyperbilirubinemia (1.9) # Elevated PT/INR - Hepatitis panel pending - Continue IV hydration as above - Continue to monitor  Benjamin [name] 09/14/2024, 7:42 AM         [1] No Known Allergies

## 2024-09-14 NOTE — Progress Notes (Signed)
 eLink Physician-Brief Progress Note Patient Name: Benjamin [name] DOB: April 07, 1933 MRN: [id number]   Date of Service  09/14/2024  HPI/Events of Note  Pain persists despite dilaudid  described by [facility] as most likely leg cramps. Ongoing Mg and Calcium  given Also with decreased UO  eICU Interventions  Ordered a one time dose of orphenadrine for cramps Ordered a trial of 500 cc LR bolus Discussed with BSRN     Intervention Category Intermediate Interventions: Pain - evaluation and management;Oliguria - evaluation and management  [name] 09/14/2024, 10:57 PM

## 2024-09-14 NOTE — Progress Notes (Signed)
 Patient seen and examined; admitted after midnight secondary to sepsis with septic shock due to cholecystitis/cholangitis and gram neg rod bacteremia. Patient is critically ill and requiring peripheral pressors at the moment to assist with BP stability/management. Reporting RUQ pain and nausea. Afebrile and without murmurs, rubs or gallops present on auscultation. Please refer to H&P written by Dr. Adefeso for further info/details on admission.  Physical exam: General exam: Alert, awake, following commands appropriately and complaining of nausea. Respiratory system: no wheezing or crackles, good sat on 3-4L supplementation  Cardiovascular system:RRR. No murmurs, rubs, gallops. Gastrointestinal system: Abdomen is nondistended, soft and tender to palpation on RUQ. No guarding. Positive BS Central nervous system: Alert and oriented. No focal neurological deficits. Extremities: No C/C/E, +pedal pulses Skin: No rashes, lesions or ulcers Psychiatry: Judgement and insight appear normal. Flat affect appreciated.  Plan: -continue IVF's, IV antibiotics and pressor support -patient critically ill and overall condition guarded. -once further stabilize I will expect the need for cholecystostomy most likely; with cholecystectomy approach to be determine by general surgery. -continue as needed antiemetics and supportive care. -patient is Full code. -family updated at bedside.  Eric Nunnery MD [phone]

## 2024-09-14 NOTE — Discharge Instructions (Signed)

## 2024-09-14 NOTE — Consult Note (Signed)
 "     Chief Complaint: Patient was seen in consultation today for acute cholecystitis, septic shock.  Referring Physician(s): Claudene Sieving, MD  Supervising Physician: Vanice Revel  Patient Status: Benjamin [name] inpatient - transferring to [name]  History of Present Illness: Benjamin [name] is a 89 y.o. male with a past medical history significant for hypothyroidism, hypercholesteremia, BPH s/p TURP who presented to the ED yesterday with complaints of body aches, weakness, runny nose, fever, abdominal pain and poor oral intake for about 2 weeks. On initial evaluation he was noted to be tachypneic with SpO2 84% on room air and hypotensive. Labs negative for COVID/flu/RSV, elevated LFTs with normal bilirubin, mild leukocytosis, lactic acid 3.1, INR 1.5. Blood culture (+) for enterobacterales and e.coli. CT abd/pelvis w/contrast was obtained and showed:  1. Cholelithiasis and Choledocholithiasis with intrahepatic and extrahepatic biliary ductal dilatation. 2. Findings suspicious for acute cholecystitis. Ultrasound would be helpful for further evaluation. 3. Inflammatory changes of the adjacent duodenum  General surgery was consulted and given concern for worsening septic shock with likely need for additional interventions not available at [facility], he is planned to be transferred to [facility] ICU today for ongoing care. IR has been consulted for percutaneous cholecystostomy placement for source control.  Patient seen in the ICU with family at bedside. He feels weak and tired, pain somewhat improved with medications, wondering when he will be going to [facility]. He has not had anything to eat or drink today, he is very thirsty. Discussed the procedure, risks, alternatives and plan for tube to remain in place for a minimum of 6 weeks with the patient and his family who are in agreement with proceeding.   Past Medical History:  Diagnosis Date   Hypercholesteremia    Thyroid disease     Urinary retention     Past Surgical History:  Procedure Laterality Date   BACK SURGERY     sciatic nerve    COLONOSCOPY  10/2009   Dr. Harvey: frequent diverticula, pathology with lymphocytic colitis. Large internal hemorrhoids.    FLEXIBLE SIGMOIDOSCOPY N/A 11/12/2016   Procedure: FLEXIBLE SIGMOIDOSCOPY;  Surgeon: Lamar CHRISTELLA Hollingshead, MD;  Location: [facility];  Service: Endoscopy;  Laterality: N/A;   TRANSURETHRAL RESECTION OF BLADDER TUMOR Bilateral 10/31/2014   Procedure: CYSTO, BLADDER BIOPSY/CLOT EVACUATION, , BILATERAL RETROGRADE PYELOGRAM,BILATERAL URETERAL STENT PLACEMENT;  Surgeon: Ricardo Likens, MD;  Location: [facility] ORS;  Service: Urology;  Laterality: Bilateral;    Allergies: Patient has no known allergies.  Medications: Prior to Admission medications  Medication Sig Start Date End Date Taking? Authorizing Provider  tamsulosin  (FLOMAX ) 0.4 MG CAPS capsule TAKE 1 CAPSULE (0.4 MG TOTAL) BY MOUTH IN THE MORNING . 08/28/22   Watt Rush, MD     Family History  Problem Relation Age of Onset   Colon cancer Neg Hx     Social History   Socioeconomic History   Marital status: Married    Spouse name: Not on file   Number of children: Not on file   Years of education: Not on file   Highest education level: Not on file  Occupational History   Occupation: retired  Tobacco Use   Smoking status: Never   Smokeless tobacco: Never  Substance and Sexual Activity   Alcohol use: No   Drug use: No   Sexual activity: Not on file  Other Topics Concern   Not on file  Social History Narrative   Not on file   Social Drivers  of Health   Tobacco Use: Low Risk (09/13/2024)   Patient History    Smoking Tobacco Use: Never    Smokeless Tobacco Use: Never    Passive Exposure: Not on file  Financial Resource Strain: Not on file  Food Insecurity: No Food Insecurity (09/13/2024)   Epic    Worried About Programme Researcher, Broadcasting/film/video in the Last Year: Never true    Ran Out of Food in the Last Year:  Never true  Transportation Needs: No Transportation Needs (09/13/2024)   Epic    Lack of Transportation (Medical): No    Lack of Transportation (Non-Medical): No  Physical Activity: Not on file  Stress: Not on file  Social Connections: Socially Integrated (09/13/2024)   Social Connection and Isolation Panel    Frequency of Communication with Friends and Family: More than three times a week    Frequency of Social Gatherings with Friends and Family: More than three times a week    Attends Religious Services: 1 to 4 times per year    Active Member of [employer] or Organizations: No    Attends Banker Meetings: 1 to 4 times per year    Marital Status: Married  Depression (EYV7-0): Not on file  Alcohol Screen: Not on file  Housing: Low Risk (09/13/2024)   Epic    Unable to Pay for Housing in the Last Year: No    Number of Times Moved in the Last Year: 0    Homeless in the Last Year: No  Utilities: Not At Risk (09/13/2024)   Epic    Threatened with loss of utilities: No  Health Literacy: Not on file     Review of Systems: A 12 point ROS discussed and pertinent positives are indicated in the HPI above.  All other systems are negative.  Review of Systems  Constitutional:  Positive for appetite change and fatigue.  Respiratory:  Negative for cough and shortness of breath.   Cardiovascular:  Negative for chest pain.  Gastrointestinal:  Positive for abdominal pain and nausea. Negative for blood in stool, diarrhea and vomiting.  Genitourinary:  Negative for hematuria.  Neurological:  Negative for dizziness and headaches.    Vital Signs: BP (!) 84/41   Pulse 82   Temp 99.7 F (37.6 C) (Rectal)   Resp (!) 21   Ht 5' 7 (1.702 m)   Wt 116 lb 10 oz (52.9 kg)   SpO2 100%   BMI 18.27 kg/m   Physical Exam Vitals and nursing note reviewed.  Constitutional:      Appearance: He is ill-appearing.  HENT:     Head: Normocephalic.     Mouth/Throat:     Mouth: Mucous membranes are  dry.     Pharynx: Oropharynx is clear. No oropharyngeal exudate or posterior oropharyngeal erythema.  Cardiovascular:     Rate and Rhythm: Normal rate.  Pulmonary:     Effort: Pulmonary effort is normal.  Abdominal:     General: There is no distension.     Palpations: Abdomen is soft.     Tenderness: There is abdominal tenderness (RUQ).  Skin:    General: Skin is warm and dry.     Coloration: Skin is not jaundiced.  Neurological:     Mental Status: He is alert and oriented to person, place, and time.  Psychiatric:        Mood and Affect: Mood normal.        Behavior: Behavior normal.  Thought Content: Thought content normal.        Judgment: Judgment normal.    MD Evaluation Airway: WNL Heart: WNL Abdomen: WNL Chest/ Lungs: WNL ASA  Classification: 3 Mallampati/Airway Score: Two  Labs:  CBC: Recent Labs    09/13/24 1756 09/14/24 0309  WBC 11.1* 3.6*  HGB 12.0* 12.3*  HCT 37.3* 39.3  PLT 418* 351    COAGS: Recent Labs    09/14/24 0805  INR 1.5*    BMP: Recent Labs    09/13/24 1756 09/14/24 0309  NA 137 139  K 3.8 3.4*  CL 101 106  CO2 20* 16*  GLUCOSE 104* 100*  BUN 29* 27*  CALCIUM  8.7* 8.2*  CREATININE 0.84 0.89  GFRNONAA >60 >60    LIVER FUNCTION TESTS: Recent Labs    09/13/24 1756 09/14/24 0309  BILITOT 1.0 1.9*  AST 812* 3,654*  ALT 210* 1,217*  ALKPHOS 422* 755*  PROT 6.9 6.1*  ALBUMIN  3.3* 2.9*    TUMOR MARKERS: No results for input(s): AFPTM, CEA, CA199, CHROMGRNA in the last 8760 hours.  Assessment and Plan:  89 y/o M with little known medical history as he does not pursue medical care regularly who presented to the ED yesterday with complaints of flu like symptoms, poor oral intake and abdominal pain for about 2 weeks. He was noted to have concern for acute cholecystitis as well as bacteremia. General surgery was consulted who notes he is a not a surgical candidate currently and recommendations made for  percutaneous cholecystostomy in IR for source control and transfer to [facility] for ongoing care given additional service availability.  Patient history and imaging reviewed by Dr. Vanice who approves procedure for today, currently awaiting [facility] for transfer.  Plan: - Remain NPO until post procedure, timing of diet will be at discretion of primary team. May have few sips of water  with oral medications if needed. - Pre procedure labs reviewed, no additional labs needed. - Currently receiving Zosyn  with last dose @ 1329, will order additional antibiotic prophylaxis if indicated depending on procedure time/next scheduled dose. - IR will follow up with patient after cholecystostomy placement.  IR recommendations when nearing discharge: - Percutaneous cholecystostomy drain to remain in place at least 6 weeks.  - Recommend fluoroscopy with injection of the drain in IR to evaluate for patency of the cystic duct. - If the duct is patent and general surgery feels patient is stable for cholecystectomy, the drain would be removed at time of surgery. - If the duct is patent and general surgery feels patient is NEVER a candidate for cholecystectomy, drain can be capped for a trial. - If trial is unsuccessful, then patient will need routine exchanges of the  chole tube about every 8-10 weeks.  Please call IR PA office at [phone] when patient is nearing discharge to ensure appropriate follow up with us .    Thank you for this interesting consult.  I greatly enjoyed meeting Benjamin [name] and look forward to participating in their care.  A copy of this report was sent to the requesting provider on this date.  Electronically Signed: Clotilda DELENA Hesselbach, PA-C 09/14/2024, 2:33 PM   I spent a total of 40 Minutes in face to face in clinical consultation, greater than 50% of which was counseling/coordinating care for acute cholecystitis.   "

## 2024-09-14 NOTE — Progress Notes (Addendum)
 Progress Note  RN called due to patient becoming lethargic and tachycardic shortly after arrival to the floor.  He was noted to be febrile. EKG showed afib with rvr at 143  At bedside, patient looked ill and lethargic.  Mucous membrane was dry.  Patient was noted to be septic, IV hydration per sepsis protocol was started.  Tylenol  was given  Physical Exam BP 105/69   Pulse 67   Temp (!) 103 F (39.4 C) (Bladder)   Resp (!) 22   Ht 5' 7 (1.702 m)   Wt 52.9 kg   SpO2 93%   BMI 18.27 kg/m   Gen:-Ill appearing.  Awake Alert HEENT:- [place].AT, No sclera icterus, dry mucous membranes Neck-Supple Neck,No JVD,.  Lungs-  CTAB  CV- S1, S2 normal Abd-  +ve B.Sounds, Abd Soft, No tenderness,    Extremity/Skin:- No  edema,   warm and dry Psych-affect is appropriate, oriented x3 Neuro-no new focal deficits, no tremors  Assessment and plan Sepsis possibly due to acute cholecystitis with cholelithiasis Patient met SIRS criteria due to being febrile, tachycardic and leukopenic (WBC 3.6) with source of infection being presumed to be due to gallbladder Continue IV Zosyn  Tylenol  was given, this was followed with IV Toradol  and cooling blanket  Lactic acidosis possibly secondary to above Lactic acid 3.1, continue IV hydration and continue to trend lactic acid  Paroxysmal atrial fibrillation with RVR Patient's increased HR was possibly due to patient being febrile Reassess HR when patient becomes afebrile and consider starting rate control.  Family communication: Patient's condition was discussed with son Gini) at bedside.  Total time:  54 minutes This includes time reviewing the chart including progress notes, labs, EKGs, taking medical decisions, ordering labs and documenting findings.  Please refer to admission H&P for details regarding the care of this patient

## 2024-09-14 NOTE — Progress Notes (Signed)
 PT Cancellation Note  Patient Details Name: BREVYN RING MRN: [id number] DOB: 09-Jun-1933   Cancelled Treatment:    Reason Eval/Treat Not Completed: Medical issues which prohibited therapy. Patient not medically ready, will check back tomorrow.   2:05 PM, 09/14/24 Lynwood Music, MPT Physical Therapist with [facility] [phone] office [phone] mobile phone

## 2024-09-14 NOTE — Progress Notes (Signed)
 PHARMACY - PHYSICIAN COMMUNICATION CRITICAL VALUE ALERT - BLOOD CULTURE IDENTIFICATION (BCID)  [name] is an 89 y.o. male who presented to [facility] on 09/13/2024   Assessment:  BCID shows e. Coli w/out resistance  Name of physician (or Provider) Contacted: Dr. Ricky  Current antibiotics: Zosyn   Changes to prescribed antibiotics recommended:  Consider de-escalation to ceftriaxone  2 grams IV every 24 hours and flagyl   No results found for this or any previous visit.  Elspeth Sour, PharmD Clinical Pharmacist 09/14/2024 1:02 PM

## 2024-09-14 NOTE — Progress Notes (Signed)
 09/14/2024 Accepted to any [facility] ICU for E Coli bacteremia, cholecystitis, septic shock, MSOF.  Rolan Sharps MD PCCM

## 2024-09-15 ENCOUNTER — Inpatient Hospital Stay (HOSPITAL_COMMUNITY)

## 2024-09-15 ENCOUNTER — Encounter (HOSPITAL_COMMUNITY): Admission: EM | Disposition: A | Payer: Self-pay | Source: Home / Self Care | Attending: Pulmonary Disease

## 2024-09-15 DIAGNOSIS — R7881 Bacteremia: Secondary | ICD-10-CM

## 2024-09-15 DIAGNOSIS — B9620 Unspecified Escherichia coli [E. coli] as the cause of diseases classified elsewhere: Secondary | ICD-10-CM

## 2024-09-15 DIAGNOSIS — G9341 Metabolic encephalopathy: Secondary | ICD-10-CM

## 2024-09-15 DIAGNOSIS — E876 Hypokalemia: Secondary | ICD-10-CM | POA: Diagnosis not present

## 2024-09-15 DIAGNOSIS — K209 Esophagitis, unspecified without bleeding: Secondary | ICD-10-CM

## 2024-09-15 DIAGNOSIS — K5710 Diverticulosis of small intestine without perforation or abscess without bleeding: Secondary | ICD-10-CM

## 2024-09-15 DIAGNOSIS — R7401 Elevation of levels of liver transaminase levels: Secondary | ICD-10-CM | POA: Diagnosis not present

## 2024-09-15 DIAGNOSIS — K8030 Calculus of bile duct with cholangitis, unspecified, without obstruction: Secondary | ICD-10-CM

## 2024-09-15 DIAGNOSIS — K810 Acute cholecystitis: Secondary | ICD-10-CM | POA: Diagnosis not present

## 2024-09-15 DIAGNOSIS — E8721 Acute metabolic acidosis: Secondary | ICD-10-CM | POA: Diagnosis not present

## 2024-09-15 DIAGNOSIS — E039 Hypothyroidism, unspecified: Secondary | ICD-10-CM | POA: Diagnosis not present

## 2024-09-15 DIAGNOSIS — N179 Acute kidney failure, unspecified: Secondary | ICD-10-CM

## 2024-09-15 DIAGNOSIS — K3189 Other diseases of stomach and duodenum: Secondary | ICD-10-CM

## 2024-09-15 LAB — CBC
HCT: 31.9 % — ABNORMAL LOW (ref 39.0–52.0)
Hemoglobin: 10.6 g/dL — ABNORMAL LOW (ref 13.0–17.0)
MCH: 29.3 pg (ref 26.0–34.0)
MCHC: 33.2 g/dL (ref 30.0–36.0)
MCV: 88.1 fL (ref 80.0–100.0)
Platelets: 260 10*3/uL (ref 150–400)
RBC: 3.62 MIL/uL — ABNORMAL LOW (ref 4.22–5.81)
RDW: 14.6 % (ref 11.5–15.5)
WBC: 28.9 10*3/uL — ABNORMAL HIGH (ref 4.0–10.5)
nRBC: 0.1 % (ref 0.0–0.2)

## 2024-09-15 LAB — COMPREHENSIVE METABOLIC PANEL WITH GFR
ALT: 779 U/L — ABNORMAL HIGH (ref 0–44)
AST: 1409 U/L — ABNORMAL HIGH (ref 15–41)
Albumin: 2.6 g/dL — ABNORMAL LOW (ref 3.5–5.0)
Alkaline Phosphatase: 412 U/L — ABNORMAL HIGH (ref 38–126)
Anion gap: 15 (ref 5–15)
BUN: 33 mg/dL — ABNORMAL HIGH (ref 8–23)
CO2: 19 mmol/L — ABNORMAL LOW (ref 22–32)
Calcium: 7.3 mg/dL — ABNORMAL LOW (ref 8.9–10.3)
Chloride: 101 mmol/L (ref 98–111)
Creatinine, Ser: 1.77 mg/dL — ABNORMAL HIGH (ref 0.61–1.24)
GFR, Estimated: 36 mL/min — ABNORMAL LOW
Glucose, Bld: 147 mg/dL — ABNORMAL HIGH (ref 70–99)
Potassium: 4.9 mmol/L (ref 3.5–5.1)
Sodium: 135 mmol/L (ref 135–145)
Total Bilirubin: 1.5 mg/dL — ABNORMAL HIGH (ref 0.0–1.2)
Total Protein: 5.3 g/dL — ABNORMAL LOW (ref 6.5–8.1)

## 2024-09-15 LAB — LACTIC ACID, PLASMA
Lactic Acid, Venous: 3.8 mmol/L (ref 0.5–1.9)
Lactic Acid, Venous: 4.4 mmol/L (ref 0.5–1.9)

## 2024-09-15 LAB — GLUCOSE, CAPILLARY
Glucose-Capillary: 99 mg/dL (ref 70–99)
Glucose-Capillary: 109 mg/dL — ABNORMAL HIGH (ref 70–99)
Glucose-Capillary: 106 mg/dL — ABNORMAL HIGH (ref 70–99)
Glucose-Capillary: 83 mg/dL (ref 70–99)

## 2024-09-15 LAB — PROTIME-INR
INR: 1.5 — ABNORMAL HIGH (ref 0.8–1.2)
Prothrombin Time: 18.9 s — ABNORMAL HIGH (ref 11.4–15.2)

## 2024-09-15 LAB — MAGNESIUM: Magnesium: 2.3 mg/dL (ref 1.7–2.4)

## 2024-09-15 MED ORDER — SODIUM CHLORIDE 0.9 % IV SOLN: INTRAVENOUS | Status: AC | PRN

## 2024-09-15 MED ORDER — HEPARIN SODIUM (PORCINE) 5000 UNIT/ML IJ SOLN
5000.0000 [IU] | Freq: Three times a day (TID) | INTRAMUSCULAR | Status: DC
  Administered 2024-09-15 – 2024-09-18 (×10): 5000 [IU] via SUBCUTANEOUS
  Filled 2024-09-15 (×11): qty 1

## 2024-09-15 MED ORDER — LACTATED RINGERS IV BOLUS
500.0000 mL | Freq: Once | INTRAVENOUS | Status: AC
  Administered 2024-09-15: 500 mL via INTRAVENOUS

## 2024-09-15 MED ORDER — DICLOFENAC SUPPOSITORY 100 MG
RECTAL | Status: AC
  Filled 2024-09-15: qty 1

## 2024-09-15 MED ORDER — IOHEXOL 300 MG/ML  SOLN
50.0000 mL | Freq: Once | INTRAMUSCULAR | Status: AC | PRN
  Administered 2024-09-15: 40 mL

## 2024-09-15 MED ORDER — ONDANSETRON HCL 4 MG/2ML IJ SOLN
INTRAMUSCULAR | Status: DC | PRN
  Administered 2024-09-15: 4 mg via INTRAVENOUS

## 2024-09-15 MED ORDER — FENTANYL CITRATE (PF) 100 MCG/2ML IJ SOLN
INTRAMUSCULAR | Status: AC
  Filled 2024-09-15: qty 2

## 2024-09-15 MED ORDER — LACTATED RINGERS IV SOLN: INTRAVENOUS | Status: DC

## 2024-09-15 MED ORDER — NOREPINEPHRINE 4 MG/250ML-% IV SOLN
0.0000 ug/min | INTRAVENOUS | Status: DC
  Administered 2024-09-15: 5 ug/min via INTRAVENOUS
  Administered 2024-09-16: 3 ug/min via INTRAVENOUS
  Filled 2024-09-15: qty 250

## 2024-09-15 MED ORDER — SODIUM CHLORIDE 0.9% FLUSH
5.0000 mL | Freq: Three times a day (TID) | INTRAVENOUS | Status: AC
  Administered 2024-09-15 – 2024-09-23 (×23): 5 mL

## 2024-09-15 MED ORDER — SODIUM CHLORIDE 0.9 % IV SOLN: INTRAVENOUS | Status: DC

## 2024-09-15 MED ORDER — LIDOCAINE-EPINEPHRINE 1 %-1:100000 IJ SOLN
INTRAMUSCULAR | Status: AC
  Filled 2024-09-15: qty 20

## 2024-09-15 MED ORDER — CHLORHEXIDINE GLUCONATE 4 % EX SOLN
CUTANEOUS | Status: AC
  Filled 2024-09-15: qty 15

## 2024-09-15 MED ORDER — LACTATED RINGERS IV SOLN
INTRAVENOUS | Status: AC | PRN
  Administered 2024-09-15: 1000 mL via INTRAVENOUS

## 2024-09-15 MED ORDER — SODIUM CHLORIDE 0.9 % IV SOLN
4.0000 g | Freq: Once | INTRAVENOUS | Status: AC
  Administered 2024-09-15: 4 g via INTRAVENOUS
  Filled 2024-09-15: qty 40

## 2024-09-15 MED ORDER — GLUCAGON HCL RDNA (DIAGNOSTIC) 1 MG IJ SOLR
INTRAMUSCULAR | Status: AC
  Filled 2024-09-15: qty 1

## 2024-09-15 MED ORDER — SUGAMMADEX SODIUM 200 MG/2ML IV SOLN
INTRAVENOUS | Status: DC | PRN
  Administered 2024-09-15: 200 mg via INTRAVENOUS

## 2024-09-15 MED ORDER — GLUCAGON HCL RDNA (DIAGNOSTIC) 1 MG IJ SOLR
INTRAMUSCULAR | Status: DC | PRN
  Administered 2024-09-15: .25 mg via INTRAVENOUS

## 2024-09-15 MED ORDER — SODIUM CHLORIDE 0.9 % IV SOLN
2.0000 g | INTRAVENOUS | Status: AC
  Administered 2024-09-15 – 2024-09-23 (×9): 2 g via INTRAVENOUS
  Filled 2024-09-15 (×9): qty 20

## 2024-09-15 MED ORDER — SODIUM CHLORIDE 0.9 % IV SOLN
2.0000 g | INTRAVENOUS | Status: DC
  Filled 2024-09-15: qty 2

## 2024-09-15 MED ORDER — SODIUM BICARBONATE 8.4 % IV SOLN
INTRAVENOUS | Status: DC
  Filled 2024-09-15 (×2): qty 1000

## 2024-09-15 MED ORDER — HEPARIN SODIUM (PORCINE) 5000 UNIT/ML IJ SOLN: 5000.0000 [IU] | Freq: Three times a day (TID) | INTRAMUSCULAR | Status: DC

## 2024-09-15 MED ORDER — ROCURONIUM BROMIDE 10 MG/ML (PF) SYRINGE
PREFILLED_SYRINGE | INTRAVENOUS | Status: DC | PRN
  Administered 2024-09-15: 50 mg via INTRAVENOUS

## 2024-09-15 MED ORDER — DICLOFENAC SUPPOSITORY 100 MG
RECTAL | Status: DC | PRN
  Administered 2024-09-15: 100 mg via RECTAL

## 2024-09-15 MED ORDER — INDOMETHACIN 50 MG RE SUPP
100.0000 mg | Freq: Once | RECTAL | Status: DC
  Filled 2024-09-15: qty 2

## 2024-09-15 MED ORDER — PHENYLEPHRINE 80 MCG/ML (10ML) SYRINGE FOR IV PUSH (FOR BLOOD PRESSURE SUPPORT)
PREFILLED_SYRINGE | INTRAVENOUS | Status: DC | PRN
  Administered 2024-09-15: 160 ug via INTRAVENOUS
  Administered 2024-09-15: 320 ug via INTRAVENOUS

## 2024-09-15 MED ORDER — METRONIDAZOLE 500 MG/100ML IV SOLN
500.0000 mg | Freq: Two times a day (BID) | INTRAVENOUS | Status: DC
  Administered 2024-09-15 – 2024-09-16 (×3): 500 mg via INTRAVENOUS
  Filled 2024-09-15 (×3): qty 100

## 2024-09-15 MED ORDER — CIPROFLOXACIN IN D5W 400 MG/200ML IV SOLN
INTRAVENOUS | Status: AC
  Filled 2024-09-15: qty 200

## 2024-09-15 MED ORDER — LIDOCAINE 2% (20 MG/ML) 5 ML SYRINGE
INTRAMUSCULAR | Status: DC | PRN
  Administered 2024-09-15: 60 mg via INTRAVENOUS

## 2024-09-15 MED ORDER — FENTANYL CITRATE (PF) 100 MCG/2ML IJ SOLN
INTRAMUSCULAR | Status: AC | PRN
  Administered 2024-09-15 (×2): 25 ug via INTRAVENOUS

## 2024-09-15 MED ORDER — MIDAZOLAM HCL 2 MG/2ML IJ SOLN
INTRAMUSCULAR | Status: AC
  Filled 2024-09-15: qty 2

## 2024-09-15 MED ORDER — IOHEXOL 300 MG/ML  SOLN
50.0000 mL | Freq: Once | INTRAMUSCULAR | Status: AC | PRN
  Administered 2024-09-15: 10 mL

## 2024-09-15 MED ORDER — MIDAZOLAM HCL (PF) 2 MG/2ML IJ SOLN
INTRAMUSCULAR | Status: AC | PRN
  Administered 2024-09-15 (×2): .5 mg via INTRAVENOUS

## 2024-09-15 MED ORDER — PROPOFOL 10 MG/ML IV BOLUS
INTRAVENOUS | Status: DC | PRN
  Administered 2024-09-15: 50 mg via INTRAVENOUS

## 2024-09-15 MED ORDER — PHENYLEPHRINE HCL-NACL 20-0.9 MG/250ML-% IV SOLN
INTRAVENOUS | Status: DC | PRN
  Administered 2024-09-15: 40 ug/min via INTRAVENOUS

## 2024-09-15 MED ORDER — LIDOCAINE-EPINEPHRINE 1 %-1:100000 IJ SOLN
20.0000 mL | Freq: Once | INTRAMUSCULAR | Status: AC
  Administered 2024-09-15: 10 mL via INTRADERMAL

## 2024-09-15 NOTE — Progress Notes (Signed)
 eLink Physician-Brief Progress Note Patient Name: Benjamin [name] DOB: Apr 26, 1933 MRN: [id number]   Date of Service  09/15/2024  HPI/Events of Note  Lactate 4.4 from 3.8 CO2 19, creatinine 1.77 from 1.51 Oligoanuric  eICU Interventions  Ordered another 500 cc LR bolus     Intervention Category Intermediate Interventions: Other:  [name] 09/15/2024, 6:20 AM

## 2024-09-15 NOTE — Anesthesia Procedure Notes (Signed)
 Procedure Name: Intubation Date/Time: 09/15/2024 12:11 PM  Performed by: Alen Motto D, CRNAPre-anesthesia Checklist: Patient identified, Emergency Drugs available, Suction available and Patient being monitored Patient Re-evaluated:Patient Re-evaluated prior to induction Oxygen Delivery Method: Circle System Utilized Preoxygenation: Pre-oxygenation with 100% oxygen Induction Type: IV induction Ventilation: Mask ventilation without difficulty Laryngoscope Size: Mac and 4 Grade View: Grade II Tube type: Oral Tube size: 7.5 mm Number of attempts: 1 Airway Equipment and Method: Stylet and Oral airway Placement Confirmation: ETT inserted through vocal cords under direct vision, positive ETCO2 and breath sounds checked- equal and bilateral Secured at: 23 cm Tube secured with: Tape Dental Injury: Teeth and Oropharynx as per pre-operative assessment

## 2024-09-15 NOTE — Anesthesia Postprocedure Evaluation (Signed)
"   Anesthesia Post Note  Patient: Benjamin [name]  Procedure(s) Performed: ERCP, WITH INTERVENTION IF INDICATED     Patient location during evaluation: PACU Anesthesia Type: General Level of consciousness: awake and alert Pain management: pain level controlled Vital Signs Assessment: post-procedure vital signs reviewed and stable Respiratory status: spontaneous breathing, nonlabored ventilation, respiratory function stable and patient connected to nasal cannula oxygen Cardiovascular status: blood pressure returned to baseline and stable Postop Assessment: no apparent nausea or vomiting Anesthetic complications: no   No notable events documented.  Last Vitals:  Vitals:   09/15/24 1400 09/15/24 1406  BP: (!) 101/56   Pulse: 88 91  Resp: (!) 22 18  Temp:    SpO2: 92% 94%    Last Pain:  Vitals:   09/15/24 1340  TempSrc:   PainSc: 0-No pain                 Cordella P Nephi Savage      "

## 2024-09-15 NOTE — Consult Note (Signed)
 Reason for Consult:sepsis Referring Physician: Dr Claudene Marsha JAYSON Benjamin [name] is an 89 y.o. male.  HPI: 66 yom with mmp who presents from OSH after 2 weeks of weakness, decreased po intake, ab pain, vomiting after eating.  He was evaluated and found to have elevated lfts, LA and has e coli bacteremia.  CT showed gallstones and choledocholithiasis with biliary ductal dilatation.  An US  showed distended gb with stones, sludge and pericholecystic fluid.  LA remains elevated today.  He has a perc chole in place.    Past Medical History:  Diagnosis Date   Hypercholesteremia    Thyroid disease    Urinary retention     Past Surgical History:  Procedure Laterality Date   BACK SURGERY     sciatic nerve    COLONOSCOPY  10/2009   Dr. Harvey: frequent diverticula, pathology with lymphocytic colitis. Large internal hemorrhoids.    FLEXIBLE SIGMOIDOSCOPY N/A 11/12/2016   Procedure: FLEXIBLE SIGMOIDOSCOPY;  Surgeon: Lamar CHRISTELLA Hollingshead, MD;  Location[facility];  Service: Endoscopy;  Laterality: N/A;   IR PERC CHOLECYSTOSTOMY  09/14/2024   TRANSURETHRAL RESECTION OF BLADDER TUMOR Bilateral 10/31/2014   Procedure: CYSTO, BLADDER BIOPSY/CLOT EVACUATION, , BILATERAL RETROGRADE PYELOGRAM,BILATERAL URETERAL STENT PLACEMENT;  Surgeon: Ricardo Likens, MD;  Location: [facility];  Service: Urology;  Laterality: Bilateral;    Family History  Problem Relation Age of Onset   Colon cancer Neg Hx     Social History:  reports that he has never smoked. He has never used smokeless tobacco. He reports that he does not drink alcohol and does not use drugs.  Allergies: Allergies[1]  Medications: Prior to Admission:  No medications prior to admission.    Results for orders placed or performed during the hospital encounter of 09/13/24 (from the past 48 hours)  Resp panel by RT-PCR (RSV, Flu A&B, Covid) Anterior Nasal Swab     Status: None   Collection Time: 09/13/24  5:51 PM   Specimen: Anterior Nasal Swab  Result Value  Ref Range   SARS Coronavirus 2 by RT PCR NEGATIVE NEGATIVE    Comment: (NOTE) SARS-CoV-2 target nucleic acids are NOT DETECTED.  The SARS-CoV-2 RNA is generally detectable in upper respiratory specimens during the acute phase of infection. The lowest concentration of SARS-CoV-2 viral copies this assay can detect is 138 copies/mL. A negative result does not preclude SARS-Cov-2 infection and should not be used as the sole basis for treatment or other patient management decisions. A negative result may occur with  improper specimen collection/handling, submission of specimen other than nasopharyngeal swab, presence of viral mutation(s) within the areas targeted by this assay, and inadequate number of viral copies(<138 copies/mL). A negative result must be combined with clinical observations, patient history, and epidemiological information. The expected result is Negative.  Fact Sheet for Patients:  bloggercourse.com  Fact Sheet for Healthcare Providers:  seriousbroker.it  This test is no t yet approved or cleared by the [place]  FDA and  has been authorized for detection and/or diagnosis of SARS-CoV-2 by FDA under an Emergency Use Authorization (EUA). This EUA will remain  in effect (meaning this test can be used) for the duration of the COVID-19 declaration under Section 564(b)(1) of the Act, 21 U.S.C.section 360bbb-3(b)(1), unless the authorization is terminated  or revoked sooner.       Influenza A by PCR NEGATIVE NEGATIVE   Influenza B by PCR NEGATIVE NEGATIVE    Comment: (NOTE) The Xpert Xpress SARS-CoV-2/FLU/RSV plus assay is intended as an  aid in the diagnosis of influenza from Nasopharyngeal swab specimens and should not be used as a sole basis for treatment. Nasal washings and aspirates are unacceptable for Xpert Xpress SARS-CoV-2/FLU/RSV testing.  Fact Sheet for  Patients: bloggercourse.com  Fact Sheet for Healthcare Providers: seriousbroker.it  This test is not yet approved or cleared by the [place]  FDA and has been authorized for detection and/or diagnosis of SARS-CoV-2 by FDA under an Emergency Use Authorization (EUA). This EUA will remain in effect (meaning this test can be used) for the duration of the COVID-19 declaration under Section 564(b)(1) of the Act, 21 U.S.C. section 360bbb-3(b)(1), unless the authorization is terminated or revoked.     Resp Syncytial Virus by PCR NEGATIVE NEGATIVE    Comment: (NOTE) Fact Sheet for Patients: bloggercourse.com  Fact Sheet for Healthcare Providers: seriousbroker.it  This test is not yet approved or cleared by the [place]  FDA and has been authorized for detection and/or diagnosis of SARS-CoV-2 by FDA under an Emergency Use Authorization (EUA). This EUA will remain in effect (meaning this test can be used) for the duration of the COVID-19 declaration under Section 564(b)(1) of the Act, 21 U.S.C. section 360bbb-3(b)(1), unless the authorization is terminated or revoked.  Performed at [facility], [address], [place], [place] [address]   Comprehensive metabolic panel     Status: Abnormal   Collection Time: 09/13/24  5:56 PM  Result Value Ref Range   Sodium 137 135 - 145 mmol/L    Comment: Electrolytes repeated to verify    Potassium 3.8 3.5 - 5.1 mmol/L   Chloride 101 98 - 111 mmol/L   CO2 20 (L) 22 - 32 mmol/L   Glucose, Bld 104 (H) 70 - 99 mg/dL    Comment: Glucose reference range applies only to samples taken after fasting for at least 8 hours.   BUN 29 (H) 8 - 23 mg/dL   Creatinine, Ser 9.15 0.61 - 1.24 mg/dL   Calcium  8.7 (L) 8.9 - 10.3 mg/dL   Total Protein 6.9 6.5 - 8.1 g/dL   Albumin  3.3 (L) 3.5 - 5.0 g/dL   AST 187 (H) 15 - 41 U/L   ALT 210 (H) 0 - 44 U/L    Alkaline Phosphatase 422 (H) 38 - 126 U/L   Total Bilirubin 1.0 0.0 - 1.2 mg/dL   GFR, Estimated >39 >39 mL/min    Comment: (NOTE) Calculated using the CKD-EPI Creatinine Equation (2021)    Anion gap 17 (H) 5 - 15    Comment: Performed at [facility], [address] [place], [place] [address]  Lactic acid, plasma     Status: None   Collection Time: 09/13/24  5:56 PM  Result Value Ref Range   Lactic Acid, Venous 1.5 0.5 - 1.9 mmol/L    Comment: Performed at [facility], [address] [place], [place] [address]  Lipase, blood     Status: None   Collection Time: 09/13/24  5:56 PM  Result Value Ref Range   Lipase 13 11 - 51 U/L    Comment: Performed at [facility], [address] [place], [place] [address]  Troponin T, High Sensitivity     Status: None   Collection Time: 09/13/24  5:56 PM  Result Value Ref Range   Troponin T High Sensitivity 11 0 - 19 ng/L    Comment: (NOTE) Biotin concentrations > 1000 ng/mL falsely decrease TnT results.  Serial cardiac troponin measurements are suggested.  Refer to the Links section for  chest pain algorithms and additional  guidance. Performed at [facility], [address], [place], [place] [address]   CBC with Differential     Status: Abnormal   Collection Time: 09/13/24  5:56 PM  Result Value Ref Range   WBC 11.1 (H) 4.0 - 10.5 K/uL   RBC 4.11 (L) 4.22 - 5.81 MIL/uL   Hemoglobin 12.0 (L) 13.0 - 17.0 g/dL   HCT 62.6 (L) 60.9 - 47.9 %   MCV 90.8 80.0 - 100.0 fL   MCH 29.2 26.0 - 34.0 pg   MCHC 32.2 30.0 - 36.0 g/dL   RDW 86.1 88.4 - 84.4 %   Platelets 418 (H) 150 - 400 K/uL   nRBC 0.0 0.0 - 0.2 %   Neutrophils Relative % 81 %   Neutro Abs 9.0 (H) 1.7 - 7.7 K/uL   Lymphocytes Relative 9 %   Lymphs Abs 1.0 0.7 - 4.0 K/uL   Monocytes Relative 9 %   Monocytes Absolute 1.0 0.1 - 1.0 K/uL   Eosinophils Relative 0 %   Eosinophils Absolute 0.0 0.0 - 0.5 K/uL   Basophils Relative 0 %   Basophils Absolute 0.0 0.0 - 0.1 K/uL   Immature  Granulocytes 1 %   Abs Immature Granulocytes 0.06 0.00 - 0.07 K/uL    Comment: Performed at [facility], [address], [place], [place] [address]  Pro Brain natriuretic peptide     Status: Abnormal   Collection Time: 09/13/24  5:56 PM  Result Value Ref Range   Pro Brain Natriuretic Peptide 778.0 (H) <300.0 pg/mL    Comment: (NOTE) Age Group        Cut-Points    Interpretation  < 50 years     450 pg/mL       NT-proBNP > 450 pg/mL indicates                                ADHF is likely              50 to 75 years  900 pg/mL      NT-proBNP > 900 pg/mL indicates          ADHF is likely  > 75 years      1800 pg/mL     NT-proBNP > 1800 pg/mL indicates          ADHF is likely                           All ages    Results between       Indeterminate. Further clinical             300 and the cut-   information is needed to determine            point for age group   if ADHF is present.                                                             Elecsys proBNP II/ Elecsys proBNP II STAT           Cut-Point  Interpretation  300 pg/mL                    NT-proBNP <300pg/mL indicates                             ADHF is not likely  Performed at [facility], [address], [place], [place] [address]   Magnesium      Status: None   Collection Time: 09/13/24  5:56 PM  Result Value Ref Range   Magnesium  2.1 1.7 - 2.4 mg/dL    Comment: Performed at [facility], [address] [place], [place] [address]  TSH     Status: Abnormal   Collection Time: 09/13/24  5:56 PM  Result Value Ref Range   TSH 15.600 (H) 0.350 - 4.500 uIU/mL    Comment: Performed at [facility], [address], [place], [place] [address]  Blood gas, venous     Status: Abnormal   Collection Time: 09/13/24  5:59 PM  Result Value Ref Range   pH, Ven 7.38 7.25 - 7.43   pCO2, Ven 43 (L) 44 - 60 mmHg   pO2, Ven <31 (LL) 32 - 45 mmHg    Comment: CRITICAL RESULT CALLED TO, READ BACK BY AND VERIFIED  WITH: POWELL, J ON 09/13/24 AT 1900 BY LOY, C    Bicarbonate 25.4 20.0 - 28.0 mmol/L   Acid-Base Excess 0.3 0.0 - 2.0 mmol/L   O2 Saturation 39.7 %   Patient temperature 37.7    Collection site RIGHT ANTECUBITAL    Drawn by [id number]     Comment: Performed at [facility], [address], [place], [place] [address]  Culture, blood (routine x 2)     Status: None (Preliminary result)   Collection Time: 09/13/24  6:13 PM   Specimen: BLOOD  Result Value Ref Range   Specimen Description      BLOOD BLOOD LEFT ARM Performed at [facility], [address] [place], [place] [address]    Special Requests      BOTTLES DRAWN AEROBIC AND ANAEROBIC Blood Culture adequate volume Performed at [facility], [address], [place], [place] [address]    Culture  Setup Time      AEROBIC BOTTLE ONLY GRAM NEGATIVE RODS Gram Stain Report Called to,Read Back By and Verified WithBETHA JANELL GOSLING @ [phone] ON 09/14/24 JAYSON BUCK Performed at [facility], [address], [place], [place] [address]    Culture      GRAM NEGATIVE RODS IDENTIFICATION TO FOLLOW Performed at [facility], [address], [place], [place] [address]    Report Status PENDING   Culture, blood (routine x 2)     Status: Abnormal (Preliminary result)   Collection Time: 09/13/24  6:13 PM   Specimen: BLOOD  Result Value Ref Range   Specimen Description      BLOOD LEFT ANTECUBITAL Performed at [facility], [address] [place], [place] [address]    Special Requests      BOTTLES DRAWN AEROBIC AND ANAEROBIC Blood Culture adequate volume Performed at [facility], [address] [place], [place] [address]    Culture  Setup Time      IN BOTH AEROBIC AND ANAEROBIC BOTTLES GRAM NEGATIVE RODS Gram Stain Report Called to,Read Back By and Verified With: KATELYN ROGERS @ [phone] ON 09/14/24 C VARNER CRITICAL RESULT CALLED TO, READ BACK BY AND VERIFIED WITH: PHARMD Elspeth Sour on [phone] @1300  by  SM    Culture (A)     ESCHERICHIA  COLI SUSCEPTIBILITIES TO FOLLOW Performed at [facility], [address], [place], [place] [address]    Report Status PENDING   Blood Culture ID Panel (Reflexed)     Status: Abnormal   Collection Time: 09/13/24  6:13 PM  Result Value Ref Range   Enterococcus faecalis NOT DETECTED NOT DETECTED   Enterococcus Faecium NOT DETECTED NOT DETECTED   Listeria monocytogenes NOT DETECTED NOT DETECTED   Staphylococcus species NOT DETECTED NOT DETECTED   Staphylococcus aureus (BCID) NOT DETECTED NOT DETECTED   Staphylococcus epidermidis NOT DETECTED NOT DETECTED   Staphylococcus lugdunensis NOT DETECTED NOT DETECTED   Streptococcus species NOT DETECTED NOT DETECTED   Streptococcus agalactiae NOT DETECTED NOT DETECTED   Streptococcus pneumoniae NOT DETECTED NOT DETECTED   Streptococcus pyogenes NOT DETECTED NOT DETECTED   A.calcoaceticus-baumannii NOT DETECTED NOT DETECTED   Bacteroides fragilis NOT DETECTED NOT DETECTED   Enterobacterales DETECTED (A) NOT DETECTED    Comment: Enterobacterales represent a large order of gram negative bacteria, not a single organism. CRITICAL RESULT CALLED TO, READ BACK BY AND VERIFIED WITH: PHARMD Elspeth Sour on [phone] @1300  by SM    Enterobacter cloacae complex NOT DETECTED NOT DETECTED   Escherichia coli DETECTED (A) NOT DETECTED    Comment: CRITICAL RESULT CALLED TO, READ BACK BY AND VERIFIED WITH: PHARMD Elspeth Sour on [phone] @1300  by SM    Klebsiella aerogenes NOT DETECTED NOT DETECTED   Klebsiella oxytoca NOT DETECTED NOT DETECTED   Klebsiella pneumoniae NOT DETECTED NOT DETECTED   Proteus species NOT DETECTED NOT DETECTED   Salmonella species NOT DETECTED NOT DETECTED   Serratia marcescens NOT DETECTED NOT DETECTED   Haemophilus influenzae NOT DETECTED NOT DETECTED   Neisseria meningitidis NOT DETECTED NOT DETECTED   Pseudomonas aeruginosa NOT DETECTED NOT DETECTED   Stenotrophomonas maltophilia NOT DETECTED NOT DETECTED   Candida albicans  NOT DETECTED NOT DETECTED   Candida auris NOT DETECTED NOT DETECTED   Candida glabrata NOT DETECTED NOT DETECTED   Candida krusei NOT DETECTED NOT DETECTED   Candida parapsilosis NOT DETECTED NOT DETECTED   Candida tropicalis NOT DETECTED NOT DETECTED   Cryptococcus neoformans/gattii NOT DETECTED NOT DETECTED   CTX-M ESBL NOT DETECTED NOT DETECTED   Carbapenem resistance IMP NOT DETECTED NOT DETECTED   Carbapenem resistance KPC NOT DETECTED NOT DETECTED   Carbapenem resistance NDM NOT DETECTED NOT DETECTED   Carbapenem resist OXA 48 LIKE NOT DETECTED NOT DETECTED   Carbapenem resistance VIM NOT DETECTED NOT DETECTED    Comment: Performed at [facility], [address], [place], [place] [address]  Troponin T, High Sensitivity     Status: None   Collection Time: 09/13/24  8:13 PM  Result Value Ref Range   Troponin T High Sensitivity 11 0 - 19 ng/L    Comment: (NOTE) Biotin concentrations > 1000 ng/mL falsely decrease TnT results.  Serial cardiac troponin measurements are suggested.  Refer to the Links section for chest pain algorithms and additional  guidance. Performed at [facility], [address], [place], [place] [address]   Hepatitis panel, acute     Status: None   Collection Time: 09/13/24  8:13 PM  Result Value Ref Range   Hepatitis B Surface Ag NON REACTIVE NON REACTIVE   HCV Ab NON REACTIVE NON REACTIVE    Comment: (NOTE) Nonreactive HCV antibody screen is consistent with no HCV infections,  unless recent infection is  suspected or other evidence exists to indicate HCV infection.     Hep A IgM NON REACTIVE NON REACTIVE   Hep B C IgM NON REACTIVE NON REACTIVE    Comment: Performed at [facility], [address] [place], [place] [address]  T4, free     Status: None   Collection Time: 09/13/24  8:13 PM  Result Value Ref Range   Free T4 1.07 0.80 - 2.00 ng/dL    Comment: Performed at [facility], [address], [place], [place] [address]   Comprehensive metabolic panel     Status: Abnormal   Collection Time: 09/14/24  3:09 AM  Result Value Ref Range   Sodium 139 135 - 145 mmol/L   Potassium 3.4 (L) 3.5 - 5.1 mmol/L   Chloride 106 98 - 111 mmol/L   CO2 16 (L) 22 - 32 mmol/L   Glucose, Bld 100 (H) 70 - 99 mg/dL    Comment: Glucose reference range applies only to samples taken after fasting for at least 8 hours.   BUN 27 (H) 8 - 23 mg/dL   Creatinine, Ser 9.10 0.61 - 1.24 mg/dL   Calcium  8.2 (L) 8.9 - 10.3 mg/dL   Total Protein 6.1 (L) 6.5 - 8.1 g/dL   Albumin  2.9 (L) 3.5 - 5.0 g/dL   AST 6,345 (H) 15 - 41 U/L   ALT 1,217 (H) 0 - 44 U/L   Alkaline Phosphatase 755 (H) 38 - 126 U/L   Total Bilirubin 1.9 (H) 0.0 - 1.2 mg/dL   GFR, Estimated >39 >39 mL/min    Comment: (NOTE) Calculated using the CKD-EPI Creatinine Equation (2021)    Anion gap 18 (H) 5 - 15    Comment: Performed at [facility], [address] [place], [place] [address]  CBC     Status: Abnormal   Collection Time: 09/14/24  3:09 AM  Result Value Ref Range   WBC 3.6 (L) 4.0 - 10.5 K/uL   RBC 4.30 4.22 - 5.81 MIL/uL   Hemoglobin 12.3 (L) 13.0 - 17.0 g/dL   HCT 60.6 60.9 - 47.9 %   MCV 91.4 80.0 - 100.0 fL   MCH 28.6 26.0 - 34.0 pg   MCHC 31.3 30.0 - 36.0 g/dL   RDW 85.9 88.4 - 84.4 %   Platelets 351 150 - 400 K/uL   nRBC 0.0 0.0 - 0.2 %    Comment: Performed at [facility], [address] [place], [place] [address]  Phosphorus     Status: Abnormal   Collection Time: 09/14/24  3:09 AM  Result Value Ref Range   Phosphorus 2.3 (L) 2.5 - 4.6 mg/dL    Comment: Performed at [facility], [address], [place], [place] [address]  Lactic acid, plasma     Status: Abnormal   Collection Time: 09/14/24  3:09 AM  Result Value Ref Range   Lactic Acid, Venous 3.1 (HH) 0.5 - 1.9 mmol/L    Comment: Critical Value, Read Back and verified with WENDI Sprang [phone] [id number], Cpmjb,G Performed at [facility], [address], [place], [place] [address]   MRSA Next Gen by  PCR, Nasal     Status: None   Collection Time: 09/14/24  4:07 AM   Specimen: Nasal Mucosa; Nasal Swab  Result Value Ref Range   MRSA by PCR Next Gen NOT DETECTED NOT DETECTED    Comment: (NOTE) The GeneXpert MRSA Assay (FDA approved for NASAL specimens only), is one component of a  comprehensive MRSA colonization surveillance program. It is not intended to diagnose MRSA infection nor to guide or monitor treatment for MRSA infections. Test performance is not FDA approved in patients less than 31 years old. Performed at [facility], [address], [place], [place] [address]   Urinalysis, Routine w reflex microscopic -Urine, Clean Catch     Status: Abnormal   Collection Time: 09/14/24  4:50 AM  Result Value Ref Range   Color, Urine AMBER (A) YELLOW    Comment: BIOCHEMICALS MAY BE AFFECTED BY COLOR   APPearance HAZY (A) CLEAR   Specific Gravity, Urine 1.034 (H) 1.005 - 1.030   pH 5.0 5.0 - 8.0   Glucose, UA NEGATIVE NEGATIVE mg/dL   Hgb urine dipstick NEGATIVE NEGATIVE   Bilirubin Urine NEGATIVE NEGATIVE   Ketones, ur 5 (A) NEGATIVE mg/dL   Protein, ur 30 (A) NEGATIVE mg/dL   Nitrite NEGATIVE NEGATIVE   Leukocytes,Ua MODERATE (A) NEGATIVE   RBC / HPF 0-5 0 - 5 RBC/hpf   WBC, UA >50 0 - 5 WBC/hpf   Bacteria, UA RARE (A) NONE SEEN   Squamous Epithelial / HPF 0-5 0 - 5 /HPF   Mucus PRESENT     Comment: Performed at [facility], [address] [place], [place] [address]  Lactic acid, plasma     Status: Abnormal   Collection Time: 09/14/24  5:44 AM  Result Value Ref Range   Lactic Acid, Venous 6.1 (HH) 0.5 - 1.9 mmol/L    Comment: Critical Value, Read Back and verified with KYM Linger [phone], Collin PARAS  Performed at [facility], [address], [place], [place] [address]   Magnesium      Status: None   Collection Time: 09/14/24  5:44 AM  Result Value Ref Range   Magnesium  1.8 1.7 - 2.4 mg/dL    Comment: Performed at [facility], [address] [place], [place] [address]   Lipase, blood     Status: Abnormal   Collection Time: 09/14/24  5:44 AM  Result Value Ref Range   Lipase <10 (L) 11 - 51 U/L    Comment: Performed at [facility], [address] [place], [place] [address]  Protime-INR     Status: Abnormal   Collection Time: 09/14/24  8:05 AM  Result Value Ref Range   Prothrombin Time 18.4 (H) 11.4 - 15.2 seconds   INR 1.5 (H) 0.8 - 1.2    Comment: (NOTE) INR goal varies based on device and disease states. Performed at [facility], [address] [place], [place] [address]   Glucose, capillary     Status: Abnormal   Collection Time: 09/14/24  4:35 PM  Result Value Ref Range   Glucose-Capillary 101 (H) 70 - 99 mg/dL    Comment: Glucose reference range applies only to samples taken after fasting for at least 8 hours.  MRSA Next Gen by PCR, Nasal     Status: None   Collection Time: 09/14/24  5:09 PM   Specimen: Nasal Mucosa; Nasal Swab  Result Value Ref Range   MRSA by PCR Next Gen NOT DETECTED NOT DETECTED    Comment: (NOTE) The GeneXpert MRSA Assay (FDA approved for NASAL specimens only), is one component of a comprehensive MRSA colonization surveillance program. It is not intended to diagnose MRSA infection nor to guide or monitor treatment for MRSA infections. Test performance is not FDA approved in patients less than 42 years old. Performed at [facility], [address], [place], [place]  [address]   Aerobic/Anaerobic Culture w Gram Stain (surgical/deep wound)     Status: None (Preliminary result)   Collection Time: 09/14/24  5:33 PM   Specimen: BILE  Result Value Ref Range   Specimen Description BILE    Special Requests NONE    Gram Stain      ABUNDANT WBC PRESENT, PREDOMINANTLY PMN MODERATE GRAM NEGATIVE RODS FEW GRAM POSITIVE COCCI Performed at [facility], [address], [place], [place] [address]    Culture ABUNDANT GRAM NEGATIVE RODS    Report Status PENDING   Renal function panel     Status: Abnormal    Collection Time: 09/14/24  7:13 PM  Result Value Ref Range   Sodium 139 135 - 145 mmol/L   Potassium 4.2 3.5 - 5.1 mmol/L    Comment: Delta check noted    Chloride 106 98 - 111 mmol/L   CO2 18 (L) 22 - 32 mmol/L   Glucose, Bld 155 (H) 70 - 99 mg/dL    Comment: Glucose reference range applies only to samples taken after fasting for at least 8 hours.   BUN 29 (H) 8 - 23 mg/dL   Creatinine, Ser 8.48 (H) 0.61 - 1.24 mg/dL   Calcium  7.3 (L) 8.9 - 10.3 mg/dL   Phosphorus 3.2 2.5 - 4.6 mg/dL   Albumin  2.4 (L) 3.5 - 5.0 g/dL   GFR, Estimated 44 (L) >60 mL/min    Comment: (NOTE) Calculated using the CKD-EPI Creatinine Equation (2021)    Anion gap 16 (H) 5 - 15    Comment: Performed at [facility], [address] [place], [place] [address]  Lactic acid, plasma     Status: Abnormal   Collection Time: 09/14/24  7:13 PM  Result Value Ref Range   Lactic Acid, Venous 3.8 (HH) 0.5 - 1.9 mmol/L    Comment: CRITICAL RESULT CALLED TO, READ BACK BY AND VERIFIED WITH: BILLY PARAS, RN [id number] 09/15/2024 SANDOVAL K  Performed at [facility], [address], [place], [place] [address] CORRECTED ON 01/29 AT 9386: PREVIOUSLY REPORTED AS 3.8 Critical value noted. Value is consistent with previously reported and called value    I-STAT 7, (LYTES, BLD GAS, ICA, H+H)     Status: Abnormal   Collection Time: 09/14/24  7:20 PM  Result Value Ref Range   pH, Arterial 7.410 7.35 - 7.45   pCO2 arterial 23.3 (L) 32 - 48 mmHg   pO2, Arterial 390 (H) 83 - 108 mmHg   Bicarbonate 14.8 (L) 20.0 - 28.0 mmol/L   TCO2 15 (L) 22 - 32 mmol/L   O2 Saturation 100 %   Acid-base deficit 9.0 (H) 0.0 - 2.0 mmol/L   Sodium 138 135 - 145 mmol/L   Potassium 4.0 3.5 - 5.1 mmol/L   Calcium , Ion 1.04 (L) 1.15 - 1.40 mmol/L   HCT 29.0 (L) 39.0 - 52.0 %   Hemoglobin 9.9 (L) 13.0 - 17.0 g/dL   Patient temperature 63.2 C    Collection site RADIAL, ALLEN'S TEST ACCEPTABLE    Drawn by RT    Sample type ARTERIAL   Glucose,  capillary     Status: Abnormal   Collection Time: 09/14/24  7:55 PM  Result Value Ref Range   Glucose-Capillary 136 (H) 70 - 99 mg/dL    Comment: Glucose reference range applies only to samples taken after fasting for at least 8 hours.  Comprehensive metabolic panel     Status: Abnormal   Collection  Time: 09/15/24  5:02 AM  Result Value Ref Range   Sodium 135 135 - 145 mmol/L   Potassium 4.9 3.5 - 5.1 mmol/L   Chloride 101 98 - 111 mmol/L   CO2 19 (L) 22 - 32 mmol/L   Glucose, Bld 147 (H) 70 - 99 mg/dL    Comment: Glucose reference range applies only to samples taken after fasting for at least 8 hours.   BUN 33 (H) 8 - 23 mg/dL   Creatinine, Ser 8.22 (H) 0.61 - 1.24 mg/dL   Calcium  7.3 (L) 8.9 - 10.3 mg/dL   Total Protein 5.3 (L) 6.5 - 8.1 g/dL   Albumin  2.6 (L) 3.5 - 5.0 g/dL   AST 8,590 (H) 15 - 41 U/L   ALT 779 (H) 0 - 44 U/L   Alkaline Phosphatase 412 (H) 38 - 126 U/L   Total Bilirubin 1.5 (H) 0.0 - 1.2 mg/dL   GFR, Estimated 36 (L) >60 mL/min    Comment: (NOTE) Calculated using the CKD-EPI Creatinine Equation (2021)    Anion gap 15 5 - 15    Comment: Performed at [facility], [address] [place], [place] [address]  CBC     Status: Abnormal   Collection Time: 09/15/24  5:02 AM  Result Value Ref Range   WBC 28.9 (H) 4.0 - 10.5 K/uL   RBC 3.62 (L) 4.22 - 5.81 MIL/uL   Hemoglobin 10.6 (L) 13.0 - 17.0 g/dL   HCT 68.0 (L) 60.9 - 47.9 %   MCV 88.1 80.0 - 100.0 fL   MCH 29.3 26.0 - 34.0 pg   MCHC 33.2 30.0 - 36.0 g/dL   RDW 85.3 88.4 - 84.4 %   Platelets 260 150 - 400 K/uL   nRBC 0.1 0.0 - 0.2 %    Comment: Performed at [facility], [address], [place], [place] [address]  Protime-INR     Status: Abnormal   Collection Time: 09/15/24  5:02 AM  Result Value Ref Range   Prothrombin Time 18.9 (H) 11.4 - 15.2 seconds   INR 1.5 (H) 0.8 - 1.2    Comment: (NOTE) INR goal varies based on device and disease states. Performed at [facility], [address], [place], [place] [address]   Lactic acid, plasma     Status: Abnormal   Collection Time: 09/15/24  5:02 AM  Result Value Ref Range   Lactic Acid, Venous 4.4 (HH) 0.5 - 1.9 mmol/L    Comment: Critical Value, Read Back and verified with BILLY PARAS, RN [phone] 09/15/2024 SANDOVAL K  Performed at [facility], [address], [place], [place] [address]   Magnesium      Status: None   Collection Time: 09/15/24  5:02 AM  Result Value Ref Range   Magnesium  2.3 1.7 - 2.4 mg/dL    Comment: Performed at [facility], [address], [place], [place] [address]  Glucose, capillary     Status: None   Collection Time: 09/15/24  7:30 AM  Result Value Ref Range   Glucose-Capillary 99 70 - 99 mg/dL    Comment: Glucose reference range applies only to samples taken after fasting for at least 8 hours.      Review of Systems  Unable to perform ROS: Mental status change   Blood pressure (!) 100/59, pulse 88, temperature 97.9 F (36.6 C), resp. rate (!) 27, height 5' 7 (1.702 m), weight 57.9 kg, SpO2 93%. Physical Exam  Vitals reviewed.  Constitutional:      Appearance: He is ill-appearing.  Eyes:     General: No scleral icterus. Cardiovascular:     Rate and Rhythm: Regular rhythm. Tachycardia present.  Pulmonary:     Effort: Pulmonary effort is normal.  Abdominal:     Palpations: Abdomen is soft.     Comments: Perc chole in place  Skin:    Capillary Refill: Capillary refill takes less than 2 seconds.  Neurological:     Mental Status: He is disoriented.     Assessment/Plan: Cholecystitis ? Cholangitis E coli bacteremia -agree with perc chole and GI eval for ERCP -no role for surgery right now -will follow  Donnice Bury 09/15/2024, 9:07 AM         [1] No Known Allergies

## 2024-09-15 NOTE — Procedures (Signed)
 Pre procedural Dx: CBD obstruction, failed ERCP  Post procedural Dx: Same  Technically successful [lacementof a 10 Fr drainage catheter placement into tbiliary tree, transhepatic int/ext drain.  Tip in duodenum.      EBL: Trace Complications: None immediate  KANDICE Banner, MD Pager #: [phone]

## 2024-09-15 NOTE — Consult Note (Addendum)
 "   Consultation  Referring Provider: CCM/Smith Primary Care Physician:  Pcp, No Primary Gastroenterologist: unassigned  Reason for Consultation: Acute cholangitis, E. coli bacteremia  HPI: Benjamin [name] is a 89 y.o. male with no known previous significant medical problems other than elevated cholesterol and hypothyroidism.  He presented to the ER at [facility] 09/14/2027 after a 2-week history of cold flulike symptoms with fever, malaise, then developed abdominal pain nausea and vomiting on 09/13/2024 and was brought to the ER.  Initial sats were low in the 80s, he required oxygen.  Initial labs show WBC of 11.1/BNP 718 Respiratory panel negative Initial lactate 1.5>3.1 T. bili 1.0/alk phos 422/AST 812/ALT 210 Hepatitis panel negative  Blood cultures obtained   Started on broad-spectrum biotic coverage.   CT angio negative for PE CT abdomen/pelvis/120 02/2025 shows well-distended gallbladder with wall thickening and mild pericholecystic inflammatory changes gallstones in the gallbladder extrahepatic biliary ductal dilation with stone in the distal common bile duct duct measures 14mm  He was seen by surgery, who recommended percutaneous cholecystostomy.  Arrangements were made for transfer [facility] for IR PERC drainage.  Lactate was up to 6.1 last p.m.  OnTransfer he did require pressor support, off this morning. Cholecystostomy was placed last p.m.  This a.m.-pro time 66.9/INR 1.5 WBC 28.9/Hb 10.6/hematocrit 31.9 Potassium 4.9/BUN 33/creatinine 1.77 T. bili 1.5/AST 1409/ALT 779----yesterday he had AST of 3654/ALT 1217 Lactate 4.4 this a.m.  Blood cultures growing, antibiotics have been transitioned to ceftriaxone  and metronidazole .  Patient is lethargic, arousable but not really able to answer any questions, family at bedside, 2 sons, I updated them and explained need for upcoming ERCP.  Past Medical History:  Diagnosis Date   Hypercholesteremia    Thyroid disease     Urinary retention     Past Surgical History:  Procedure Laterality Date   BACK SURGERY     sciatic nerve    COLONOSCOPY  10/2009   Dr. Harvey: frequent diverticula, pathology with lymphocytic colitis. Large internal hemorrhoids.    FLEXIBLE SIGMOIDOSCOPY N/A 11/12/2016   Procedure: FLEXIBLE SIGMOIDOSCOPY;  Surgeon: Lamar CHRISTELLA Hollingshead, MD;  Location: [facility] SUITE;  Service: Endoscopy;  Laterality: N/A;   IR PERC CHOLECYSTOSTOMY  09/14/2024   TRANSURETHRAL RESECTION OF BLADDER TUMOR Bilateral 10/31/2014   Procedure: CYSTO, BLADDER BIOPSY/CLOT EVACUATION, , BILATERAL RETROGRADE PYELOGRAM,BILATERAL URETERAL STENT PLACEMENT;  Surgeon: Ricardo Likens, MD;  Location: [facility] ORS;  Service: Urology;  Laterality: Bilateral;    Prior to Admission medications  Not on File    Current Facility-Administered Medications  Medication Dose Route Frequency Provider Last Rate Last Admin   0.9 %  sodium chloride  infusion   Intravenous PRN Sharie Bourbon, MD 5 mL/hr at 09/15/24 0939 New Bag at 09/15/24 9060   acetaminophen  (TYLENOL ) tablet 650 mg  650 mg Oral Q6H PRN Adefeso, Oladapo, DO   650 mg at 09/14/24 9160   Or   acetaminophen  (TYLENOL ) suppository 650 mg  650 mg Rectal Q6H PRN Adefeso, Oladapo, DO       baclofen  (LIORESAL ) tablet 5 mg  5 mg Oral Once PRN Marion Damien DASEN, MD       calcium  gluconate 4 g in sodium chloride  0.9 % 100 mL IVPB  4 g Intravenous Once Hussein, Abdullahi, MD 140 mL/hr at 09/15/24 0940 4 g at 09/15/24 0940   cefTRIAXone  (ROCEPHIN ) 2 g in sodium chloride  0.9 % 100 mL IVPB  2 g Intravenous Q24H Sharie Bourbon, MD       Chlorhexidine  Gluconate  Cloth 2 % PADS 6 each  6 each Topical Daily Adefeso, Oladapo, DO   6 each at 09/14/24 1135   heparin  injection 5,000 Units  5,000 Units Subcutaneous Q8H Hussein, Lenny, MD       HYDROmorphone  (DILAUDID ) injection 0.5 mg  0.5 mg Intravenous Q3H PRN Marion Damien DASEN, MD   0.5 mg at 09/15/24 0502   lactated ringers  infusion   Intravenous  Continuous Sharie Lenny, MD 75 mL/hr at 09/15/24 0937 New Bag at 09/15/24 9062   metroNIDAZOLE  (FLAGYL ) IVPB 500 mg  500 mg Intravenous BID Sharie Lenny, MD 100 mL/hr at 09/15/24 0948 500 mg at 09/15/24 0948   ondansetron  (ZOFRAN ) tablet 4 mg  4 mg Oral Q6H PRN Adefeso, Oladapo, DO       Or   ondansetron  (ZOFRAN ) injection 4 mg  4 mg Intravenous Q6H PRN Adefeso, Oladapo, DO   4 mg at 09/14/24 2036   pantoprazole  (PROTONIX ) injection 40 mg  40 mg Intravenous Q24H Adefeso, Oladapo, DO   40 mg at 09/15/24 0503   prochlorperazine  (COMPAZINE ) injection 10 mg  10 mg Intravenous Q6H PRN Ricky Fines, MD   10 mg at 09/15/24 0000   sodium bicarbonate  150 mEq in dextrose  5 % 1,150 mL infusion   Intravenous Continuous Sharie Lenny, MD 100 mL/hr at 09/15/24 0925 Rate Change at 09/15/24 9074   sodium chloride  flush (NS) 0.9 % injection 5 mL  5 mL Intracatheter Q8H Mugweru, Jon, MD   5 mL at 09/15/24 0557    Allergies as of 09/13/2024   (No Known Allergies)    Family History  Problem Relation Age of Onset   Colon cancer Neg Hx     Social History   Socioeconomic History   Marital status: Married    Spouse name: Not on file   Number of children: Not on file   Years of education: Not on file   Highest education level: Not on file  Occupational History   Occupation: retired  Tobacco Use   Smoking status: Never   Smokeless tobacco: Never  Substance and Sexual Activity   Alcohol use: No   Drug use: No   Sexual activity: Not on file  Other Topics Concern   Not on file  Social History Narrative   Not on file   Social Drivers of Health   Tobacco Use: Low Risk (09/13/2024)   Patient History    Smoking Tobacco Use: Never    Smokeless Tobacco Use: Never    Passive Exposure: Not on file  Financial Resource Strain: Not on file  Food Insecurity: No Food Insecurity (09/13/2024)   Epic    Worried About Radiation Protection Practitioner of Food in the Last Year: Never true    Ran Out of Food in the  Last Year: Never true  Transportation Needs: No Transportation Needs (09/13/2024)   Epic    Lack of Transportation (Medical): No    Lack of Transportation (Non-Medical): No  Physical Activity: Not on file  Stress: Not on file  Social Connections: Socially Integrated (09/13/2024)   Social Connection and Isolation Panel    Frequency of Communication with Friends and Family: More than three times a week    Frequency of Social Gatherings with Friends and Family: More than three times a week    Attends Religious Services: 1 to 4 times per year    Active Member of [employer] or Organizations: No    Attends Banker Meetings: 1 to 4 times per year  Marital Status: Married  Catering Manager Violence: Not At Risk (09/13/2024)   Epic    Fear of Current or Ex-Partner: No    Emotionally Abused: No    Physically Abused: No    Sexually Abused: No  Depression (PHQ2-9): Not on file  Alcohol Screen: Not on file  Housing: Low Risk (09/13/2024)   Epic    Unable to Pay for Housing in the Last Year: No    Number of Times Moved in the Last Year: 0    Homeless in the Last Year: No  Utilities: Not At Risk (09/13/2024)   Epic    Threatened with loss of utilities: No  Health Literacy: Not on file    Review of Systems: Unable to offer  Physical Exam: Vital signs in last 24 hours: Temp:  [97.7 F (36.5 C)-99.1 F (37.3 C)] 97.9 F (36.6 C) (01/29 0930) Pulse Rate:  [77-107] 99 (01/29 0900) Resp:  [9-40] 29 (01/29 0930) BP: (80-159)/(29-102) 91/56 (01/29 0930) SpO2:  [67 %-100 %] 95 % (01/29 0900) Weight:  [57.9 kg] 57.9 kg (01/28 1641) Last BM Date : 09/14/24 General:   Alert,  Well-developed, thin elderly WM  cooperative in NAD, trying to answer question s/ confused , family at bedside Head:  Normocephalic and atraumatic. Eyes:  Sclera icteric   Conjunctiva pink. Ears:  Normal auditory acuity. Nose:  No deformity, discharge,  or lesions. Mouth:  No deformity or lesions.   Neck:   Supple; no masses or thyromegaly. Lungs:  Clear throughout to auscultation.   No wheezes, crackles, or rhonchi.  Heart:  tachyRegular rate and rhythm; no murmurs, clicks, rubs,  or gallops. Abdomen:  Soft,tender across upper abdomen , BS present, nonpalp mass or hsm.  Cholecystostomy tube in place with non purulent bilious effluent  Rectal:  Deferred  Msk:  Symmetrical without gross deformities. . Pulses:  Normal pulses noted. Extremities:  Without clubbing or edema. Neurologic:  lethargic  arousable  Skin:  Intact without significant lesions or rashes.. Psych:  Alert and cooperative. Normal mood and affect.  Intake/Output from previous day: 01/28 0701 - 01/29 0700 In: 5006.3 [I.V.:2544.5; IV Piggyback:2456.8] Out: 605 [Urine:355; Drains:250] Intake/Output this shift: Total I/O In: 552.9 [I.V.:167.3; IV Piggyback:385.6] Out: 55 [Urine:5; Drains:50]  Lab Results: Recent Labs    09/13/24 1756 09/14/24 0309 09/14/24 1920 09/15/24 0502  WBC 11.1* 3.6*  --  28.9*  HGB 12.0* 12.3* 9.9* 10.6*  HCT 37.3* 39.3 29.0* 31.9*  PLT 418* 351  --  260   BMET Recent Labs    09/14/24 0309 09/14/24 1913 09/14/24 1920 09/15/24 0502  NA 139 139 138 135  K 3.4* 4.2 4.0 4.9  CL 106 106  --  101  CO2 16* 18*  --  19*  GLUCOSE 100* 155*  --  147*  BUN 27* 29*  --  33*  CREATININE 0.89 1.51*  --  1.77*  CALCIUM  8.2* 7.3*  --  7.3*   LFT Recent Labs    09/15/24 0502  PROT 5.3*  ALBUMIN  2.6*  AST 1,409*  ALT 779*  ALKPHOS 412*  BILITOT 1.5*   PT/INR Recent Labs    09/14/24 0805 09/15/24 0502  LABPROT 18.4* 18.9*  INR 1.5* 1.5*   Hepatitis Panel Recent Labs    09/13/24 2013  HEPBSAG NON REACTIVE  HCVAB NON REACTIVE  HEPAIGM NON REACTIVE  HEPBIGM NON REACTIVE     IMPRESSION:  #67 89 year old white male with acute cholecystitis, E. coli bacteremia/sepsis, and choledocholithiasis. Patient  had transaminases yesterday in the thousands suggesting a component of shock  liver, improved today  Off pressors this morning Cholecystostomy tube successfully placed last p.m., no purulent drainage  Progressive leukocytosis today, and persistent lactic acidosis.  Remain concern for choledocholithiasis being triggered for sepsis.  #2 mild coagulopathy secondary to sepsis #3 marked transaminitis 09/14/2025-consistent with component of shock liver/improved today  Plan; keep n.p.o. Continue IV ceftriaxone  and IV metronidazole  Patient will be scheduled for ERCP with stone extraction with Dr. Wilhelmenia for today.  I discussed the procedure in detail with both of his sons who are at bedside and they will, sign consent.  We specifically discussed anesthesia risks with general anesthesia, perforation, bleeding, procedure failure and 3 to 4% risk of pancreatitis.  Patient is at significant risk for anesthesia complications given his advanced age and sepsis.  GI will continue to follow with you    Amy Esterwood PA-C 09/15/2024, 10:25 AM     Attending Physician's Attestation   I have taken an interval history, reviewed the chart and examined the patient.   This is a patient that the GI service is asked to evaluate in the setting of abnormal LFTs, cholecystitis, choledocholithiasis, biliary obstruction, with concern for GNR bacteremia and potential progressive cholangitis.  The patient initially presented to an outside facility and was transferred here for further evaluation.  Patient underwent percutaneous cholecystostomy tube placement yesterday as a result of the patient's issues.  Patient had progressive LFT abnormalities and significant hepatitis.  Patient today white count increased significantly into the 30,000 range.  With this concern and while still being on pressors it seems that underlying cholangitis is very possible.  It is important for us  to remember that some of the liver test abnormalities may be a result of recent septic physiology and hypertension, but with  this being said I do think ERCP makes sense.  Will plan for attempted ERCP later today.  The risks of an ERCP were discussed at length, including but not limited to the risk of perforation, bleeding, abdominal pain, post-ERCP pancreatitis (while usually mild can be severe and even life threatening).  The risks and benefits of endoscopic evaluation/treatment were discussed with the patient and/or family; these include but are not limited to the risk of perforation, infection, bleeding, missed lesions, lack of diagnosis, severe illness requiring hospitalization, as well as anesthesia and sedation related illnesses.  The patient's history has been reviewed, patient examined, no change in status, and deemed stable for procedure.  The patient and/or family was provided an opportunity to ask questions and all were answered.  The patient and/or family is agreeable to proceed.  Patient's son agreed with this plan of action.  If there are issues with biliary duct cannulation, PTBD in addition to percutaneous cholecystostomy tube placement may need to be considered.  We will going with a positive attitude and if this taken care of however.  Time will tell.  I agree with the Advanced Practitioner's note, impression, and recommendations with updates and my documentation as noted above.  The majority of the medical decision making/process, formulation of the impression/plan of action for the patient were performed by me with substantive portion of this encounter (>50% time spent including complete performance of at least one of the key components of MDM, History, and/or Exam).   Aloha Wilhelmenia, MD [facility] Advanced Endoscopy Office # [phone]  "

## 2024-09-15 NOTE — Progress Notes (Addendum)
 "  NAME:  Benjamin [name], MRN:  [id number], DOB:  11-03-1932, LOS: 2 ADMISSION DATE:  09/13/2024, CONSULTATION DATE:  09/14/24 REFERRING MD:  Dr. Ricky, CHIEF COMPLAINT:  septic shock   History of Present Illness:   14 yoM with PMH of BPH with urinary retention, HLD, thyroid disease, PUD/ GERD, Cdiff (2009), and duodenitis who presented from home to [facility] on 1/27 with 2 week history of body aches, weakness, subjective fever, decreased oral intake and onset of abd pain with vomiting after eating.  Found to have elevated LFTs, lactic, with blood cultures positive for e. Coli bacteremia.  CT a/p showing acute cholelithiasis and choledocholithiasis with intrahepatic and extrahepatic biliary ductal dilation.  Pt with worsening septic shock requiring peripheral NE.  CCS and IR consulted, pt to be transferred to [facility] for percutaneous cholecystostomy drain given hemodynamic instability, not a surgical candidate currently.  PCCM to admit.   Pertinent  Medical History  BPH with urinary retention, HLD, thyroid disease, PUD/ GERD, Cdiff (2009), duodenitis,   Significant Hospital Events: Including procedures, antibiotic start and stop dates in addition to other pertinent events   1/27 admit [facility] 1/28 tx to [facility]  Interim History / Subjective:   S/p cholecystostomy tube on 1/28, briefly was hypoxic requiring HFNC, but now back to nasal cannula Now off vasopressors Decreased urinary output 140 mL overnight   Objective    Blood pressure (!) 100/59, pulse 88, temperature 97.9 F (36.6 C), resp. rate (!) 27, height 5' 7 (1.702 m), weight 57.9 kg, SpO2 93%.        Intake/Output Summary (Last 24 hours) at 09/15/2024 0752 Last data filed at 09/15/2024 0700 Gross per 24 hour  Intake 5006.25 ml  Output 605 ml  Net 4401.25 ml   Filed Weights   09/13/24 1730 09/14/24 0250 09/14/24 1641  Weight: 54 kg 52.9 kg 57.9 kg    Examination: General: Frail appearing thin elderly male, not in distress HEENT:  NCAT, Chest: Symmetrical chest movement, clear to auscultation Abdomen: Soft nontender nondistended, per chole tube in place-bilious output Extremities: No edema, warm Neuro: AO X3, following commands   Resolved problem list   Assessment and Plan   Acute metabolic encephalopathy AKI with decreased urinary output Transaminitis-improving Cholecystitis-s/p PERC Chole on 1/28 E. coli bacteremia Lactic acidosis Metabolic acidosis Hypophosphatemia Hypokalemia Hypothyroidism PAF with RVR, CHAD2S@-VASC 2   Plan - Off vasopressors perfusing well aiming for MAP goal>65 - narrowing antibiotics ceftriaxone  and Flagyl  - Continue bicarb gtt. for metabolic acidosis, AKI and decreased urinary output - Difficult ERCP with GI team unable to drain due to diverticula and complex anatomy. - IR will assist with transhepatic drain today.  GI will then attempt ERCP later - Continue n.p.o - LR at 75 - PPI - trend WBC/ fever curve - strict I/Os - Monitor and replace electrolyte as needed - discussed with pt and son at bedside, pt desire full code for now but no prolonged measures    Labs   CBC: Recent Labs  Lab 09/13/24 1756 09/14/24 0309 09/14/24 1920 09/15/24 0502  WBC 11.1* 3.6*  --  28.9*  NEUTROABS 9.0*  --   --   --   HGB 12.0* 12.3* 9.9* 10.6*  HCT 37.3* 39.3 29.0* 31.9*  MCV 90.8 91.4  --  88.1  PLT 418* 351  --  260    Basic Metabolic Panel: Recent Labs  Lab 09/13/24 1756 09/14/24 0309 09/14/24 0544 09/14/24 1913 09/14/24 1920 09/15/24 0502  NA 137 139  --  139 138 135  K 3.8 3.4*  --  4.2 4.0 4.9  CL 101 106  --  106  --  101  CO2 20* 16*  --  18*  --  19*  GLUCOSE 104* 100*  --  155*  --  147*  BUN 29* 27*  --  29*  --  33*  CREATININE 0.84 0.89  --  1.51*  --  1.77*  CALCIUM  8.7* 8.2*  --  7.3*  --  7.3*  MG 2.1  --  1.8  --   --  2.3  PHOS  --  2.3*  --  3.2  --   --    GFR: Estimated Creatinine Clearance: 23.2 mL/min (A) (by C-G formula based on SCr of  1.77 mg/dL (H)). Recent Labs  Lab 09/13/24 1756 09/14/24 0309 09/14/24 0544 09/14/24 1913 09/15/24 0502  WBC 11.1* 3.6*  --   --  28.9*  LATICACIDVEN 1.5 3.1* 6.1* 3.8* 4.4*    Liver Function Tests: Recent Labs  Lab 09/13/24 1756 09/14/24 0309 09/14/24 1913 09/15/24 0502  AST 812* 3,654*  --  1,409*  ALT 210* 1,217*  --  779*  ALKPHOS 422* 755*  --  412*  BILITOT 1.0 1.9*  --  1.5*  PROT 6.9 6.1*  --  5.3*  ALBUMIN  3.3* 2.9* 2.4* 2.6*   Recent Labs  Lab 09/13/24 1756 09/14/24 0544  LIPASE 13 <10*   No results for input(s): AMMONIA in the last 168 hours.  ABG    Component Value Date/Time   PHART 7.410 09/14/2024 1920   PCO2ART 23.3 (L) 09/14/2024 1920   PO2ART 390 (H) 09/14/2024 1920   HCO3 14.8 (L) 09/14/2024 1920   TCO2 15 (L) 09/14/2024 1920   ACIDBASEDEF 9.0 (H) 09/14/2024 1920   O2SAT 100 09/14/2024 1920     Coagulation Profile: Recent Labs  Lab 09/14/24 0805 09/15/24 0502  INR 1.5* 1.5*    Cardiac Enzymes: No results for input(s): CKTOTAL, CKMB, CKMBINDEX, TROPONINI in the last 168 hours.  HbA1C: Hgb A1c MFr Bld  Date/Time Value Ref Range Status  01/03/2009 05:33 AM  4.6 - 6.1 % Final   5.4 (NOTE) The ADA recommends the following therapeutic goal for glycemic control related to Hgb A1c measurement: Goal of therapy: <6.5 Hgb A1c  Reference: American Diabetes Association: Clinical Practice Recommendations 2010, Diabetes Care, 2010, 33: (Suppl  1).    CBG: Recent Labs  Lab 09/14/24 1635 09/14/24 1955 09/15/24 0730  GLUCAP 101* 136* 99    Review of Systems:   Review of Systems  Constitutional:  Positive for malaise/fatigue.  Respiratory:  Negative for cough, sputum production and shortness of breath.   Cardiovascular:  Negative for chest pain.  Gastrointestinal:  Positive for abdominal pain, nausea and vomiting.  Neurological:  Negative for focal weakness.   Past Medical History:  He,  has a past medical history of  Hypercholesteremia, Thyroid disease, and Urinary retention.   Surgical History:   Past Surgical History:  Procedure Laterality Date   BACK SURGERY     sciatic nerve    COLONOSCOPY  10/2009   Dr. Harvey: frequent diverticula, pathology with lymphocytic colitis. Large internal hemorrhoids.    FLEXIBLE SIGMOIDOSCOPY N/A 11/12/2016   Procedure: FLEXIBLE SIGMOIDOSCOPY;  Surgeon: Lamar CHRISTELLA Hollingshead, MD;  Location: [facility]ITE;  Service: Endoscopy;  Laterality: N/A;   IR PERC CHOLECYSTOSTOMY  09/14/2024   TRANSURETHRAL RESECTION OF BLADDER TUMOR Bilateral 10/31/2014   Procedure: CYSTO, BLADDER BIOPSY/CLOT EVACUATION, ,  BILATERAL RETROGRADE PYELOGRAM,BILATERAL URETERAL STENT PLACEMENT;  Surgeon: Ricardo Likens, MD;  Location: [facility] ORS;  Service: Urology;  Laterality: Bilateral;     Social History:   reports that he has never smoked. He has never used smokeless tobacco. He reports that he does not drink alcohol and does not use drugs.   Family History:  His family history is negative for Colon cancer.   Allergies Allergies[1]   Home Medications  Prior to Admission medications  Medication Sig Start Date End Date Taking? Authorizing Provider  tamsulosin  (FLOMAX ) 0.4 MG CAPS capsule TAKE 1 CAPSULE (0.4 MG TOTAL) BY MOUTH IN THE MORNING . 08/28/22   Watt Rush, MD           CRITICAL CARE  Lenny Drought, MD  [facility] Prefer epic messenger for cross cover needs   My critical care time: 30 minutes  Critical care time was exclusive of separately billable procedures and treating other patients.  Critical care was necessary to treat or prevent imminent or life-threatening deterioration.  Critical care was time spent personally by me on the following activities: development of treatment plan with patient and/or surrogate as well as nursing, discussions with consultants, evaluation of patient's response to treatment, examination of patient, obtaining history from  patient or surrogate, ordering and performing treatments and interventions, ordering and review of laboratory studies, ordering and review of radiographic studies, pulse oximetry, re-evaluation of patient's condition and participation in multidisciplinary rounds.      [1] No Known Allergies  "

## 2024-09-15 NOTE — Progress Notes (Signed)
 Interventional Radiology Brief Note   Dr. Jennefer was contacted by GI for possible bili drain placement.   Patient is known to IR for perc chole placement performed by Dr. Hughes last night.  GI took the patient for ERCP with possible stent placement which was unsuccessful.  Case reviewed and approved for int/ext bili drain placement.   Patient received sedation today for the ERCP, discussed with son [name] and obtained informed consent.   Patient seen in ICU. Laying in bed, sleeping. Younger son [name] at bedside.  Patient opens his eye and able to open his mouth on command but falls asleep. ROS not obtained.   BP (!) 101/56   Pulse 91   Temp (!) 96.8 F (36 C) (Temporal)   Resp 18   Ht 5' 7 (1.702 m)   Wt 127 lb 10.3 oz (57.9 kg) Comment: from chart  SpO2 94%   BMI 19.99 kg/m   Physical Exam Vitals and nursing note reviewed.  Constitutional:      General: Patient is not in acute distress.    Appearance: appears tired, sleeping.  HENT:     Head: Normocephalic and atraumatic.     Mouth/Throat:     Mouth: Mucous membranes are dry.     Pharynx: Oropharynx is clear.  Cardiovascular:     Rate and Rhythm: Normal rate and regular rhythm.     Pulses: Normal pulses.     Heart sounds: Normal heart sounds.  Pulmonary:     Effort: Pulmonary effort is normal.     Breath sounds: Normal breath sounds.  Abdominal:     General: Abdomen is flat. Bowel sounds are normal.     Palpations: Abdomen is soft.  Musculoskeletal:     Cervical back: Neck supple.  Skin:    General: Skin is warm and dry.     Coloration: Skin is not jaundiced or pale.     Risks and benefits of int/ext bili drain placement discussed with the patient's son including, but not limited to bleeding, infection which may lead to sepsis or even death and damage to adjacent structures.  This interventional procedure involves the use of X-rays and because of the nature of the planned procedure, it is  possible that we will have prolonged use of X-ray fluoroscopy.  Potential radiation risks to you include (but are not limited to) the following: - A slightly elevated risk for cancer  several years later in life. This risk is typically less than 0.5% percent. This risk is low in comparison to the normal incidence of human cancer, which is 33% for women and 50% for men according to the American Cancer Society. - Radiation induced injury can include skin redness, resembling a rash, tissue breakdown / ulcers and hair loss (which can be temporary or permanent).   The likelihood of either of these occurring depends on the difficulty of the procedure and whether you are sensitive to radiation due to previous procedures, disease, or genetic conditions.   IF your procedure requires a prolonged use of radiation, you will be notified and given written instructions for further action.  It is your responsibility to monitor the irradiated area for the 2 weeks following the procedure and to notify your physician if you are concerned that you have suffered a radiation induced injury.    All of the son's questions were answered, there is an agreement to proceed.  Consent signed and in IR.   Plan to proceed today.  Keep patient NPO.  Cefoxitin  2 g  to be given during the procedure.  Please call IR for questions and concerns.   Benjamin [name] Benjamin Euceda PA-C 09/15/2024 3:24 PM

## 2024-09-15 NOTE — Anesthesia Preprocedure Evaluation (Addendum)
 "                                  Anesthesia Evaluation  Patient identified by MRN, date of birth, ID band Patient awake    Reviewed: Allergy & Precautions, NPO status , Patient's Chart, lab work & pertinent test results  Airway Mallampati: II  TM Distance: >3 FB Neck ROM: Full    Dental no notable dental hx. (+) Edentulous Upper, Edentulous Lower   Pulmonary    Pulmonary exam normal        Cardiovascular  Rhythm:Regular Rate:Normal  HLD   Neuro/Psych    GI/Hepatic PUD,GERD  ,,Choledocholithiasis s/p IR perc colecystostomy 09/14/2024   Endo/Other  Hypothyroidism    Renal/GU    BPH    Musculoskeletal   Abdominal Normal abdominal exam  (+)   Peds  Hematology  (+) Blood dyscrasia, anemia Lab Results      Component                Value               Date                      WBC                      28.9 (H)            09/15/2024                HGB                      10.6 (L)            09/15/2024                HCT                      31.9 (L)            09/15/2024                MCV                      88.1                09/15/2024                PLT                      260                 09/15/2024              Anesthesia Other Findings   Reproductive/Obstetrics                              Anesthesia Physical Anesthesia Plan  ASA: 3  Anesthesia Plan: General   Post-op Pain Management:    Induction: Intravenous  PONV Risk Score and Plan: Ondansetron , Dexamethasone  and Treatment may vary due to age or medical condition  Airway Management Planned: Mask and Oral ETT  Additional Equipment: None  Intra-op Plan:   Post-operative Plan: Extubation in OR  Informed Consent: I have reviewed the patients History and Physical, chart, labs and discussed the procedure including the risks, benefits  and alternatives for the proposed anesthesia with the patient or authorized representative who has indicated his/her  understanding and acceptance.     Dental advisory given  Plan Discussed with: CRNA  Anesthesia Plan Comments:         Anesthesia Quick Evaluation  "

## 2024-09-15 NOTE — Progress Notes (Signed)
 eLink Physician-Brief Progress Note Patient Name: Benjamin [name] DOB: [birth year] MRN: [id number]   Date of Service  09/15/2024  HPI/Events of Note  His UOP dropped back down to 5/hr for this last hour and had to turn levo back ( at 2) to maintain MAP at 65. He is now 3 liters positive and continues to be on bicarb drip at 100 cc/hr  eICU Interventions  Continue current fluids  Also regarding leg cramps. Orphenadrine was not available and pharm D had recommended baclofen  which patient did not require as he was able to rest after being given compazine .     Intervention Category Intermediate Interventions: Oliguria - evaluation and management  Benjamin [name] 09/15/2024, 2:57 AM

## 2024-09-15 NOTE — H&P (Signed)
 "  GASTROENTEROLOGY PROCEDURE H&P NOTE   Primary Care Physician: Pcp, No  HPI: Benjamin [name] is a 89 y.o. male who presents for ERCP for CDL and cholangitis attempt at biliary cannulation and stone extraction +/- stenting.  Past Medical History:  Diagnosis Date   Hypercholesteremia    Thyroid disease    Urinary retention    Past Surgical History:  Procedure Laterality Date   BACK SURGERY     sciatic nerve    COLONOSCOPY  10/2009   Dr. Harvey: frequent diverticula, pathology with lymphocytic colitis. Large internal hemorrhoids.    FLEXIBLE SIGMOIDOSCOPY N/A 11/12/2016   Procedure: FLEXIBLE SIGMOIDOSCOPY;  Surgeon: Lamar CHRISTELLA Hollingshead, MD;  Location: [facility];  Service: Endoscopy;  Laterality: N/A;   IR PERC CHOLECYSTOSTOMY  09/14/2024   TRANSURETHRAL RESECTION OF BLADDER TUMOR Bilateral 10/31/2014   Procedure: CYSTO, BLADDER BIOPSY/CLOT EVACUATION, , BILATERAL RETROGRADE PYELOGRAM,BILATERAL URETERAL STENT PLACEMENT;  Surgeon: Ricardo Likens, MD;  Location: [facility];  Service: Urology;  Laterality: Bilateral;   Current Facility-Administered Medications  Medication Dose Route Frequency Provider Last Rate Last Admin   0.9 %  sodium chloride  infusion   Intravenous Continuous [name], [name], MD       [facility] Hold] 0.9 %  sodium chloride  infusion   Intravenous PRN Sharie Bourbon, MD 5 mL/hr at 09/15/24 0939 New Bag at 09/15/24 0939   [MAR Hold] acetaminophen  (TYLENOL ) tablet 650 mg  650 mg Oral Q6H PRN Ricky Fines, MD   650 mg at 09/14/24 0839   Or   [MAR Hold] acetaminophen  (TYLENOL ) suppository 650 mg  650 mg Rectal Q6H PRN Ricky Fines, MD       [facility] Hold] baclofen  (LIORESAL ) tablet 5 mg  5 mg Oral Once PRN Aventura, Emily T, MD       [MAR Hold] cefTRIAXone  (ROCEPHIN ) 2 g in sodium chloride  0.9 % 100 mL IVPB  2 g Intravenous Q24H Hussein, Bourbon, MD       [MAR Hold] Chlorhexidine  Gluconate Cloth 2 % PADS 6 each  6 each Topical Daily Ricky Fines, MD   6 each at  09/14/24 1135   [MAR Hold] HYDROmorphone  (DILAUDID ) injection 0.5 mg  0.5 mg Intravenous Q3H PRN Marion Perkins T, MD   0.5 mg at 09/15/24 0502   lactated ringers  infusion   Intravenous Continuous Sharie Bourbon, MD 75 mL/hr at 09/15/24 0937 New Bag at 09/15/24 9062   lactated ringers  infusion    Continuous PRN [name], [name], MD 10 mL/hr at 09/15/24 1150 Continued from Pre-op at 09/15/24 1150   [MAR Hold] metroNIDAZOLE  (FLAGYL ) IVPB 500 mg  500 mg Intravenous BID Sharie Bourbon, MD 100 mL/hr at 09/15/24 0948 500 mg at 09/15/24 0948   [MAR Hold] ondansetron  (ZOFRAN ) tablet 4 mg  4 mg Oral Q6H PRN Ricky Fines, MD       Or   [facility] Hold] ondansetron  (ZOFRAN ) injection 4 mg  4 mg Intravenous Q6H PRN Ricky Fines, MD   4 mg at 09/14/24 2036   [facility] Hold] pantoprazole  (PROTONIX ) injection 40 mg  40 mg Intravenous Q24H Ricky Fines, MD   40 mg at 09/15/24 0503   [MAR Hold] prochlorperazine  (COMPAZINE ) injection 10 mg  10 mg Intravenous Q6H PRN Ricky Fines, MD   10 mg at 09/15/24 0000   sodium bicarbonate  150 mEq in dextrose  5 % 1,150 mL infusion   Intravenous Continuous Sharie Bourbon, MD 100 mL/hr at 09/15/24 0925 Rate Change at 09/15/24 0925   [MAR Hold] sodium chloride  flush (  NS) 0.9 % injection 5 mL  5 mL Intracatheter Q8H Mugweru, Jon, MD   5 mL at 09/15/24 0557   Current Medications[1] Allergies[2] Family History  Problem Relation Age of Onset   Colon cancer Neg Hx    Social History   Socioeconomic History   Marital status: Married    Spouse name: Not on file   Number of children: Not on file   Years of education: Not on file   Highest education level: Not on file  Occupational History   Occupation: retired  Tobacco Use   Smoking status: Never   Smokeless tobacco: Never  Substance and Sexual Activity   Alcohol use: No   Drug use: No   Sexual activity: Not on file  Other Topics Concern   Not on file  Social History Narrative   Not on file   Social  Drivers of Health   Tobacco Use: Low Risk (09/15/2024)   Patient History    Smoking Tobacco Use: Never    Smokeless Tobacco Use: Never    Passive Exposure: Not on file  Financial Resource Strain: Not on file  Food Insecurity: No Food Insecurity (09/13/2024)   Epic    Worried About Programme Researcher, Broadcasting/film/video in the Last Year: Never true    Ran Out of Food in the Last Year: Never true  Transportation Needs: No Transportation Needs (09/13/2024)   Epic    Lack of Transportation (Medical): No    Lack of Transportation (Non-Medical): No  Physical Activity: Not on file  Stress: Not on file  Social Connections: Socially Integrated (09/13/2024)   Social Connection and Isolation Panel    Frequency of Communication with Friends and Family: More than three times a week    Frequency of Social Gatherings with Friends and Family: More than three times a week    Attends Religious Services: 1 to 4 times per year    Active Member of Clubs or Organizations: No    Attends Banker Meetings: 1 to 4 times per year    Marital Status: Married  Catering Manager Violence: Not At Risk (09/13/2024)   Epic    Fear of Current or Ex-Partner: No    Emotionally Abused: No    Physically Abused: No    Sexually Abused: No  Depression (PHQ2-9): Not on file  Alcohol Screen: Not on file  Housing: Low Risk (09/13/2024)   Epic    Unable to Pay for Housing in the Last Year: No    Number of Times Moved in the Last Year: 0    Homeless in the Last Year: No  Utilities: Not At Risk (09/13/2024)   Epic    Threatened with loss of utilities: No  Health Literacy: Not on file    Physical Exam: Today's Vitals   09/15/24 1000 09/15/24 1030 09/15/24 1100 09/15/24 1134  BP: (!) 105/56 (!) 105/56 (!) 112/59 (!) 100/53  Pulse: 94 93 (!) 103 (!) 101  Resp: (!) 27 (!) 29 (!) 30 (!) 29  Temp: 98.1 F (36.7 C) 97.9 F (36.6 C) 97.9 F (36.6 C) 98.2 F (36.8 C)  TempSrc:    Temporal  SpO2: 92% 93% 94% 94%  Weight:    57.9  kg  Height:    5' 7 (1.702 m)  PainSc:       Body mass index is 19.99 kg/m. GEN: NAD EYE: Sclerae anicteric ENT: MMM CV: Non-tachycardic GI: Soft, NT/ND NEURO:  Alert & Oriented x 3  Lab Results:  Recent Labs    09/13/24 1756 09/14/24 0309 09/14/24 1920 09/15/24 0502  WBC 11.1* 3.6*  --  28.9*  HGB 12.0* 12.3* 9.9* 10.6*  HCT 37.3* 39.3 29.0* 31.9*  PLT 418* 351  --  260   BMET Recent Labs    09/14/24 0309 09/14/24 1913 09/14/24 1920 09/15/24 0502  NA 139 139 138 135  K 3.4* 4.2 4.0 4.9  CL 106 106  --  101  CO2 16* 18*  --  19*  GLUCOSE 100* 155*  --  147*  BUN 27* 29*  --  33*  CREATININE 0.89 1.51*  --  1.77*  CALCIUM  8.2* 7.3*  --  7.3*   LFT Recent Labs    09/15/24 0502  PROT 5.3*  ALBUMIN  2.6*  AST 1,409*  ALT 779*  ALKPHOS 412*  BILITOT 1.5*   PT/INR Recent Labs    09/14/24 0805 09/15/24 0502  LABPROT 18.4* 18.9*  INR 1.5* 1.5*     Impression / Plan: This is a 89 y.o.male who presents for ERCP for CDL and cholangitis attempt at biliary cannulation and stone extraction +/- stenting.  The risks of an ERCP were discussed at length, including but not limited to the risk of perforation, bleeding, abdominal pain, post-ERCP pancreatitis (while usually mild can be severe and even life threatening).   The risks and benefits of endoscopic evaluation/treatment were discussed with the patient and/or family; these include but are not limited to the risk of perforation, infection, bleeding, missed lesions, lack of diagnosis, severe illness requiring hospitalization, as well as anesthesia and sedation related illnesses.  The patient's history has been reviewed, patient examined, no change in status, and deemed stable for procedure.  The patient and/or family was provided an opportunity to ask questions and all were answered.  The patient and/or family is agreeable to proceed.    Aloha Finner, MD [facility]  Endoscopy Office # [phone]     [1]  Current Facility-Administered Medications:    0.9 %  sodium chloride  infusion, , Intravenous, Continuous, [name], [name], MD   [facility] Hold] 0.9 %  sodium chloride  infusion, , Intravenous, PRN, Sharie Bourbon, MD, Last Rate: 5 mL/hr at 09/15/24 0939, New Bag at 09/15/24 0939   [MAR Hold] acetaminophen  (TYLENOL ) tablet 650 mg, 650 mg, Oral, Q6H PRN, 650 mg at 09/14/24 0839 **OR** [MAR Hold] acetaminophen  (TYLENOL ) suppository 650 mg, 650 mg, Rectal, Q6H PRN, Ricky Fines, MD   [facility] Hold] baclofen  (LIORESAL ) tablet 5 mg, 5 mg, Oral, Once PRN, Aventura, Damien DASEN, MD   [facility] Hold] cefTRIAXone  (ROCEPHIN ) 2 g in sodium chloride  0.9 % 100 mL IVPB, 2 g, Intravenous, Q24H, Hussein, Bourbon, MD   [facility] Hold] Chlorhexidine  Gluconate Cloth 2 % PADS 6 each, 6 each, Topical, Daily, Ricky Fines, MD, 6 each at 09/14/24 1135   [MAR Hold] HYDROmorphone  (DILAUDID ) injection 0.5 mg, 0.5 mg, Intravenous, Q3H PRN, Aventura, Emily T, MD, 0.5 mg at 09/15/24 0502   lactated ringers  infusion, , Intravenous, Continuous, Hussein, Bourbon, MD, Last Rate: 75 mL/hr at 09/15/24 0937, New Bag at 09/15/24 9062   lactated ringers  infusion, , , Continuous PRN, [name], [name], MD, Last Rate: 10 mL/hr at 09/15/24 1150, Continued from Pre-op at 09/15/24 1150   [MAR Hold] metroNIDAZOLE  (FLAGYL ) IVPB 500 mg, 500 mg, Intravenous, BID, Hussein, Bourbon, MD, Last Rate: 100 mL/hr at 09/15/24 0948, 500 mg at 09/15/24 0948   [MAR Hold] ondansetron  (ZOFRAN ) tablet 4 mg, 4 mg, Oral, Q6H PRN **OR** [MAR  Hold] ondansetron  (ZOFRAN ) injection 4 mg, 4 mg, Intravenous, Q6H PRN, Ricky Fines, MD, 4 mg at 09/14/24 2036   [facility] Hold] pantoprazole  (PROTONIX ) injection 40 mg, 40 mg, Intravenous, Q24H, Ricky Fines, MD, 40 mg at 09/15/24 0503   [MAR Hold] prochlorperazine  (COMPAZINE ) injection 10 mg, 10 mg, Intravenous, Q6H PRN, Ricky Fines, MD, 10 mg at 09/15/24 0000   sodium  bicarbonate 150 mEq in dextrose  5 % 1,150 mL infusion, , Intravenous, Continuous, Hussein, Lenny, MD, Last Rate: 100 mL/hr at 09/15/24 0925, Rate Change at 09/15/24 0925   [facility] Hold] sodium chloride  flush (NS) 0.9 % injection 5 mL, 5 mL, Intracatheter, Q8H, Mugweru, Jon, MD, 5 mL at 09/15/24 0557 [2] No Known Allergies  "

## 2024-09-15 NOTE — Op Note (Signed)
 [facility] Patient Name: Benjamin [name] Procedure Date : 09/15/2024 MRN: [id number] Attending MD: Aloha Finner , MD, [id number] Date of Birth: [birth year] CSN: [id number] Age: 89 Admit Type: Inpatient Procedure:                ERCP Indications:              Bile duct stone(s), Abnormal abdominal CT,                            Ascending cholangitis, For therapy of ascending                            cholangitis, Elevated liver enzymes Providers:                Aloha Finner, MD, [name] Penner, RN, Corky Czech, Technician Referring MD:              Medicines:                General Anesthesia, Diclofenac  100 mg rectal,                            Glucagon  1 mg IV Complications:            No immediate complications. Estimated Blood Loss:     Estimated blood loss was minimal. Procedure:                Pre-Anesthesia Assessment:                           - Prior to the procedure, a History and Physical                            was performed, and patient medications and                            allergies were reviewed. The patient's tolerance of                            previous anesthesia was also reviewed. The risks                            and benefits of the procedure and the sedation                            options and risks were discussed with the patient.                            All questions were answered, and informed consent                            was obtained. Prior Anticoagulants: The patient has                            taken  no anticoagulant or antiplatelet agents. ASA                            Grade Assessment: IV - A patient with severe                            systemic disease that is a constant threat to life.                            After reviewing the risks and benefits, the patient                            was deemed in satisfactory condition to undergo the                             procedure.                           After obtaining informed consent, the scope was                            passed under direct vision. Throughout the                            procedure, the patient's blood pressure, pulse, and                            oxygen saturations were monitored continuously. The                            [employer] D single use                            duodenoscope was introduced through the mouth, and                            used to inject contrast into without successful                            cannulation. The GIF-H190 ([id number] Olympus                            endoscope was introduced through the mouth, and                            used to inject contrast into without successful                            cannulation. The ERCP was extremely difficult due                            to challenging cannulation, challenging cannulation  because of inadequate scope positioning,                            challenging cannulation because of                            intradiverticular papilla and challenging                            cannulation because of peridiverticular papilla.                            Successful completion of the procedure was aided by                            changing the patient's position and using manual                            pressure. The patient tolerated the procedure. Scope In: Scope Out: Findings:      A scout film of the abdomen was obtained. One percutaneous drain ending       in the Right upper quadrant was seen.      A standard esophagogastroduodenoscopy scope was used for the examination       of the upper gastrointestinal tract. The scope was passed under direct       vision through the upper GI tract. LA Grade B (one or more mucosal       breaks greater than 5 mm, not extending between the tops of two mucosal       folds) esophagitis with no bleeding was  found in the distal esophagus. A       10 cm hiatal hernia was present. Patchy mildly erythematous mucosa       without bleeding was found in the entire examined stomach. A deformity       was found in the stomach. A medium diverticulum was found in the second       portion of the duodenum, in the area of the papilla and in the third       portion of the duodenum. The major papilla was not found because it was       located within a diverticulum.      The duodenoscope was withdrawn from the patient. Impression:               - LA Grade B esophagitis with no bleeding.                           - 10 cm hiatal hernia.                           - Erythematous mucosa in the stomach.                           - Deformity.                           - Duodenal diverticulum.                           -  The major papilla was not found because it was                            located within a diverticulum. Recommendation:           - The patient will be observed post-procedure,                            until all discharge criteria are met.                           - Return patient to ICU for ongoing care.                           - Obtain CXR STAT post procedure.                           - Observe patient's clinical course.                           - Will need to discuss with IR and with ICU about                            the inability to cannulate the biliary tree as a                            result of his particular anatomy and thus will need                            percutaneous transhepatic biliary drainage if his                            septic/cholangitic picture remains present even                            with the cholecystostomy tube. Otherwise, at some                            point, a Rendezvous will be required for further                            attempt at cannulation of his biliary duct.                           - Watch for pancreatitis, bleeding, perforation,                             and cholangitis. Procedure Code(s):        --- Professional ---                           [phone], Esophagogastroduodenoscopy, flexible,                            transoral; diagnostic, including collection of  specimen(s) by brushing or washing, when performed                            (separate procedure) Diagnosis Code(s):        --- Professional ---                           K20.90, Esophagitis, unspecified without bleeding                           K44.9, Diaphragmatic hernia without obstruction or                            gangrene                           K31.89, Other diseases of stomach and duodenum                           K80.30, Calculus of bile duct with cholangitis,                            unspecified, without obstruction                           K83.09, Other cholangitis                           R74.8, Abnormal levels of other serum enzymes                           K57.10, Diverticulosis of small intestine without                            perforation or abscess without bleeding                           R93.5, Abnormal findings on diagnostic imaging of                            other abdominal regions, including retroperitoneum CPT copyright 2022 American Medical Association. All rights reserved. The codes documented in this report are preliminary and upon coder review may  be revised to meet current compliance requirements. Aloha Finner, MD 09/15/2024 1:45:20 PM Number of Addenda: 0

## 2024-09-15 NOTE — Progress Notes (Signed)
 PT Cancellation Note  Patient Details Name: Benjamin [name] MRN: [id number] DOB: 05/04/1933   Cancelled Treatment:    Reason Eval/Treat Not Completed: Patient at procedure or test/unavailable (Currently pt is off the floor. Will follow up as able and appropriate.)  [name], DPT, CLT  Acute Rehabilitation Services Office: [phone] (Secure chat preferred)   [name] 09/15/2024, 12:01 PM

## 2024-09-15 NOTE — Transfer of Care (Signed)
 Immediate Anesthesia Transfer of Care Note  Patient: Benjamin [name]  Procedure(s) Performed: ERCP, WITH INTERVENTION IF INDICATED  Patient Location: PACU  Anesthesia Type:General  Level of Consciousness: awake, alert , and oriented  Airway & Oxygen Therapy: Patient Spontanous Breathing and Patient connected to face mask oxygen  Post-op Assessment: Report given to RN and Post -op Vital signs reviewed and stable  Post vital signs: Reviewed and stable  Last Vitals:  Vitals Value Taken Time  BP    Temp    Pulse 94 09/15/24 13:38  Resp 29 09/15/24 13:38  SpO2 94 % 09/15/24 13:38  Vitals shown include unfiled device data.  Last Pain:  Vitals:   09/15/24 1134  TempSrc: Temporal  PainSc:          Complications: No notable events documented.

## 2024-09-16 ENCOUNTER — Inpatient Hospital Stay (HOSPITAL_COMMUNITY)

## 2024-09-16 DIAGNOSIS — R6521 Severe sepsis with septic shock: Secondary | ICD-10-CM | POA: Diagnosis not present

## 2024-09-16 DIAGNOSIS — R578 Other shock: Secondary | ICD-10-CM

## 2024-09-16 DIAGNOSIS — A4151 Sepsis due to Escherichia coli [E. coli]: Secondary | ICD-10-CM | POA: Diagnosis not present

## 2024-09-16 DIAGNOSIS — N179 Acute kidney failure, unspecified: Secondary | ICD-10-CM | POA: Diagnosis not present

## 2024-09-16 DIAGNOSIS — K810 Acute cholecystitis: Secondary | ICD-10-CM | POA: Diagnosis not present

## 2024-09-16 DIAGNOSIS — G9341 Metabolic encephalopathy: Secondary | ICD-10-CM | POA: Diagnosis not present

## 2024-09-16 DIAGNOSIS — Z9889 Other specified postprocedural states: Secondary | ICD-10-CM

## 2024-09-16 DIAGNOSIS — E039 Hypothyroidism, unspecified: Secondary | ICD-10-CM | POA: Diagnosis not present

## 2024-09-16 DIAGNOSIS — I480 Paroxysmal atrial fibrillation: Secondary | ICD-10-CM | POA: Diagnosis not present

## 2024-09-16 DIAGNOSIS — K8309 Other cholangitis: Secondary | ICD-10-CM | POA: Diagnosis not present

## 2024-09-16 LAB — ECHOCARDIOGRAM COMPLETE
Est EF: 45
Height: 67 in
S' Lateral: 3.3 cm
Weight: 2042.34 [oz_av]

## 2024-09-16 LAB — CULTURE, BLOOD (ROUTINE X 2)
Special Requests: ADEQUATE
Special Requests: ADEQUATE

## 2024-09-16 LAB — COMPREHENSIVE METABOLIC PANEL WITH GFR
ALT: 491 U/L — ABNORMAL HIGH (ref 0–44)
AST: 906 U/L — ABNORMAL HIGH (ref 15–41)
Albumin: 1.9 g/dL — ABNORMAL LOW (ref 3.5–5.0)
Alkaline Phosphatase: 249 U/L — ABNORMAL HIGH (ref 38–126)
Anion gap: 11 (ref 5–15)
BUN: 42 mg/dL — ABNORMAL HIGH (ref 8–23)
CO2: 27 mmol/L (ref 22–32)
Calcium: 6.9 mg/dL — ABNORMAL LOW (ref 8.9–10.3)
Chloride: 97 mmol/L — ABNORMAL LOW (ref 98–111)
Creatinine, Ser: 2.45 mg/dL — ABNORMAL HIGH (ref 0.61–1.24)
GFR, Estimated: 25 mL/min — ABNORMAL LOW
Glucose, Bld: 97 mg/dL (ref 70–99)
Potassium: 4.5 mmol/L (ref 3.5–5.1)
Sodium: 136 mmol/L (ref 135–145)
Total Bilirubin: 0.9 mg/dL (ref 0.0–1.2)
Total Protein: 4.5 g/dL — ABNORMAL LOW (ref 6.5–8.1)

## 2024-09-16 LAB — GLUCOSE, CAPILLARY: Glucose-Capillary: 93 mg/dL (ref 70–99)

## 2024-09-16 LAB — CBC
HCT: 30.1 % — ABNORMAL LOW (ref 39.0–52.0)
Hemoglobin: 10.3 g/dL — ABNORMAL LOW (ref 13.0–17.0)
MCH: 29.3 pg (ref 26.0–34.0)
MCHC: 34.2 g/dL (ref 30.0–36.0)
MCV: 85.8 fL (ref 80.0–100.0)
Platelets: 157 10*3/uL (ref 150–400)
RBC: 3.51 MIL/uL — ABNORMAL LOW (ref 4.22–5.81)
RDW: 14.3 % (ref 11.5–15.5)
WBC: 21.8 10*3/uL — ABNORMAL HIGH (ref 4.0–10.5)
nRBC: 0.2 % (ref 0.0–0.2)

## 2024-09-16 LAB — LACTIC ACID, PLASMA
Lactic Acid, Venous: 2.7 mmol/L (ref 0.5–1.9)
Lactic Acid, Venous: 2.9 mmol/L (ref 0.5–1.9)

## 2024-09-16 MED ORDER — ALBUMIN HUMAN 25 % IV SOLN
25.0000 g | Freq: Four times a day (QID) | INTRAVENOUS | Status: AC
  Administered 2024-09-16 – 2024-09-17 (×3): 25 g via INTRAVENOUS
  Filled 2024-09-16 (×3): qty 100

## 2024-09-16 MED ORDER — BOOST / RESOURCE BREEZE PO LIQD CUSTOM
1.0000 | Freq: Three times a day (TID) | ORAL | Status: DC
  Administered 2024-09-16 – 2024-09-17 (×4): 1 via ORAL
  Administered 2024-09-17: 237 mL via ORAL
  Administered 2024-09-17 – 2024-09-19 (×7): 1 via ORAL
  Administered 2024-09-20: 237 mL via ORAL
  Administered 2024-09-20 – 2024-09-21 (×3): 1 via ORAL
  Filled 2024-09-16 (×2): qty 1

## 2024-09-16 MED ORDER — ONDANSETRON HCL 4 MG/2ML IJ SOLN
4.0000 mg | Freq: Once | INTRAMUSCULAR | Status: AC
  Administered 2024-09-14: 4 mg via INTRAVENOUS

## 2024-09-16 MED ORDER — TRAZODONE HCL 50 MG PO TABS
50.0000 mg | ORAL_TABLET | Freq: Every day | ORAL | Status: DC
  Administered 2024-09-16 – 2024-09-20 (×5): 50 mg via ORAL
  Filled 2024-09-16 (×6): qty 1

## 2024-09-16 MED ORDER — PERFLUTREN LIPID MICROSPHERE
1.0000 mL | INTRAVENOUS | Status: AC | PRN
  Administered 2024-09-16: 3 mL via INTRAVENOUS

## 2024-09-16 MED ORDER — LACTATED RINGERS IV BOLUS
1000.0000 mL | Freq: Once | INTRAVENOUS | Status: AC
  Administered 2024-09-16: 1000 mL via INTRAVENOUS

## 2024-09-16 MED ORDER — LACTATED RINGERS IV SOLN: INTRAVENOUS | Status: AC

## 2024-09-16 MED ORDER — MELATONIN 3 MG PO TABS
3.0000 mg | ORAL_TABLET | Freq: Every day | ORAL | Status: DC
  Administered 2024-09-16 – 2024-09-20 (×5): 3 mg via ORAL
  Filled 2024-09-16 (×6): qty 1

## 2024-09-16 MED ORDER — METRONIDAZOLE 500 MG/100ML IV SOLN
500.0000 mg | Freq: Two times a day (BID) | INTRAVENOUS | Status: AC
  Administered 2024-09-16 – 2024-09-23 (×14): 500 mg via INTRAVENOUS
  Filled 2024-09-16 (×14): qty 100

## 2024-09-16 NOTE — Progress Notes (Signed)
 "   Referring Physician(s): [name], [name], MD  Supervising Physician: Jenna Hacker  Patient Status:  [facility] - In-pt  Chief Complaint:  S/p cholecystostomy tube placed by Dr. Hughes on 09/14/24 and s/p biliary drain on 09/15/24 by Dr. Jenna.  The patient initially arrived to the ED on 09/13/24 for N/V/D and increased weakness. CT abdomen and pelvis with contrast showed cholelithiasis and choledocholithiasis with intrahepatic and extrahepatic biliary ductal dilatation with suspicion for acute cholecystitis. He was admitted to the floor for septic shock in the setting of E coli bacteremia due to acute cholecystitis with AKI and metabolic encephalopathy. IR was consulted for percutaneous cholecystostomy placement for source control. On 09/15/24 GI took the patient for an ERCP, which was unsuccessful, so IR was contacted for biliary drain placement.  Subjective:  Patient seen in the ICU, lying in the bed sleeping today. Family is at bedside. Replaced biliary drain gravity bag today.   Allergies: Patient has no known allergies.  Medications: Prior to Admission medications  Not on File    Vital Signs: BP 107/72   Pulse 97   Temp 97.9 F (36.6 C)   Resp (!) 27   Ht 5' 7 (1.702 m)   Wt 127 lb 10.3 oz (57.9 kg) Comment: from chart  SpO2 93%   BMI 19.99 kg/m   Physical Exam Constitutional:      General: He is not in acute distress.    Appearance: He is ill-appearing.  Abdominal:     General: There is no distension.     Palpations: Abdomen is soft.     Comments: 10 Fr cholecystostomy tube intact, no signs of drainage around site or on dressing; dark, bilious drainage noted in gravity bag. Flushes easily.  10 Fr biliary drain tube placement intact, no signs of drainage around site or on dressing; dark, bilious drainage noted in gravity bag. Small, clotted area at the superior connector portion of gravity bag, biliary drain tube replaced today. Flushes easily after new  placement.  Skin:    General: Skin is warm and dry.     Findings: No erythema or rash.    Labs:  CBC: Recent Labs    09/13/24 1756 09/14/24 0309 09/14/24 1920 09/15/24 0502 09/16/24 0658  WBC 11.1* 3.6*  --  28.9* 21.8*  HGB 12.0* 12.3* 9.9* 10.6* 10.3*  HCT 37.3* 39.3 29.0* 31.9* 30.1*  PLT 418* 351  --  260 157    COAGS: Recent Labs    09/14/24 0805 09/15/24 0502  INR 1.5* 1.5*    BMP: Recent Labs    09/14/24 0309 09/14/24 1913 09/14/24 1920 09/15/24 0502 09/16/24 0658  NA 139 139 138 135 136  K 3.4* 4.2 4.0 4.9 4.5  CL 106 106  --  101 97*  CO2 16* 18*  --  19* 27  GLUCOSE 100* 155*  --  147* 97  BUN 27* 29*  --  33* 42*  CALCIUM  8.2* 7.3*  --  7.3* 6.9*  CREATININE 0.89 1.51*  --  1.77* 2.45*  GFRNONAA >60 44*  --  36* 25*    LIVER FUNCTION TESTS: Recent Labs    09/13/24 1756 09/14/24 0309 09/14/24 1913 09/15/24 0502 09/16/24 0658  BILITOT 1.0 1.9*  --  1.5* 0.9  AST 812* 3,654*  --  1,409* 906*  ALT 210* 1,217*  --  779* 491*  ALKPHOS 422* 755*  --  412* 249*  PROT 6.9 6.1*  --  5.3* 4.5*  ALBUMIN  3.3* 2.9*  2.4* 2.6* 1.9*    Assessment and Plan:  Patient with cholecystostomy and biliary drain placement. Labs from today showed improving WBC, improving bilirubin. Increased creatinine today at 2.45.   Drain Location: RUQ Size: Fr size: 10 Fr Date of placement: Cholecystostomy tube (09/14/24) Currently to: Drain collection device: gravity  Drain Location: RUQ Size: Fr size: 10 Fr Date of placement: Biliary drain (09/15/24) Currently to: Drain collection device: gravity 24 hour output:  Output by Drain (mL) 09/14/24 0701 - 09/14/24 1900 09/14/24 1901 - 09/15/24 0700 09/15/24 0701 - 09/15/24 1900 09/15/24 1901 - 09/16/24 0700 09/16/24 0701 - 09/16/24 1513  Biliary Tube RUQ  250 225 350 250  Biliary Tube Other (Comment) 10 Fr. RUQ   60 20     Interval imaging/drain manipulation:  None  Current examination: Flushes/aspirates easily.   Insertion site unremarkable. Suture in place. Dressed appropriately.   Plan: Continue TID flushes with 5 cc NS. Record output Q shift. Dressing changes QD or PRN if soiled.  Call IR APP or on call IR MD if difficulty flushing or sudden change in drain output.   Electronically Signed: Greig FORBES Jasmine, PA-C 09/16/2024, 12:02 PM   I spent a total of 25 Minutes at the the patient's bedside AND on the patient's hospital floor or unit, greater than 50% of which was counseling/coordinating care for acute cholecystitis with ascending cholangitis.       "

## 2024-09-16 NOTE — Plan of Care (Signed)
   Problem: Clinical Measurements: Goal: Respiratory complications will improve Outcome: Progressing

## 2024-09-16 NOTE — TOC Progression Note (Signed)
 Transition of Care [facility]) - Progression Note    Patient Details  Name: Benjamin [name] MRN: [id number] Date of Birth: 27-Nov-1932  Transition of Care [facility]) CM/SW Contact  Lendia Dais, [place] Phone Number: 09/16/2024, 3:27 PM  Clinical Narrative: CSW spoke to pt, pt's son [name] and daughter in law at bedside. CSW introduced self and role.  CSW informed pt and family about PT rec of SNF. Pt and family were agreeable to SNF, CSW made the family aware of SNF process and stated that it will be sent out once the pt progresses more medically to prevent unnecessary denials. Pt and family stated understanding.  Pt remains ICU appropriate to be monitor for further pressor needs.  CSW will continue to monitor.                      Expected Discharge Plan and Services                                               Social Drivers of Health (SDOH) Interventions SDOH Screenings   Food Insecurity: No Food Insecurity (09/13/2024)  Housing: Low Risk (09/13/2024)  Transportation Needs: No Transportation Needs (09/13/2024)  Utilities: Not At Risk (09/13/2024)  Social Connections: Socially Integrated (09/13/2024)  Tobacco Use: Low Risk (09/15/2024)    Readmission Risk Interventions    09/14/2024   12:43 PM  Readmission Risk Prevention Plan  Transportation Screening Complete  PCP or Specialist Appt within 5-7 Days Complete  Home Care Screening Complete  Medication Review (RN CM) Complete

## 2024-09-16 NOTE — Progress Notes (Addendum)
 Patient ID: Benjamin [name], male   DOB: Jul 22, 1933, 89 y.o.   MRN: [id number]    Progress Note   Subjective   Day # 2 CC; acute cholecystitis, acute cholangitis and E. coli bacteremia  Status post percutaneous cholecystostomy 1/298/2026  ERCP yesterday unable to cannulate the common bile duct secondary to diverticuli in the second portion of the duodenum and third portion of the duodenum 10 cm hiatal hernia  Patient underwent percutaneous biliary drainage yesterday per IR  IV ceftriaxone /IV metronidazole  Levophed  off at present  Labs-WBC 21.8 down from 28.9 Hemoglobin 10.3/hematocrit 30.1 Lactate 2.7 trending down Sodium 136/potassium 4.5/BUN 42/creatinine 2.45 up from 1.51  Resting comfortably, family at bedside tolerated clear liquids for lunch, some complaints of pain into the back  Cholecystostomy tube-about 400 cc bilious in bag, Percutaneous biliary drain-about 50 cc of dark brown nonpurulent material   Objective   Vital signs in last 24 hours: Temp:  [96.8 F (36 C)-98.1 F (36.7 C)] 97.7 F (36.5 C) (01/30 1345) Pulse Rate:  [82-125] 100 (01/30 1345) Resp:  [14-30] 23 (01/30 1345) BP: (90-147)/(47-100) 103/92 (01/30 1345) SpO2:  [85 %-100 %] 94 % (01/30 1345) Last BM Date : 09/14/24 General: Elderly white male in NAD, resting comfortably sleeping, did not awaken to my exam Heart:  Regular rate and rhythm; no murmurs Lungs: Respirations even and unlabored, lungs CTA bilaterally Abdomen:  Soft, nondistended, no appreciable tenderness normal bowel sounds.  Drains in right upper quadrant Extremities:  Without edema. Neurologic: Somnolent did not awaken, Psych: Somnolent did not awaken  Intake/Output from previous day: 01/29 0701 - 01/30 0700 In: 4035.8 [I.V.:2316; IV Piggyback:1689.8] Out: 730 [Urine:75; Drains:655] Intake/Output this shift: Total I/O In: 1335.5 [I.V.:609.5; Other:10; IV Piggyback:716] Out: 275 [Urine:25; Drains:250]  Lab Results: Recent  Labs    09/14/24 0309 09/14/24 1920 09/15/24 0502 09/16/24 0658  WBC 3.6*  --  28.9* 21.8*  HGB 12.3* 9.9* 10.6* 10.3*  HCT 39.3 29.0* 31.9* 30.1*  PLT 351  --  260 157   BMET Recent Labs    09/14/24 1913 09/14/24 1920 09/15/24 0502 09/16/24 0658  NA 139 138 135 136  K 4.2 4.0 4.9 4.5  CL 106  --  101 97*  CO2 18*  --  19* 27  GLUCOSE 155*  --  147* 97  BUN 29*  --  33* 42*  CREATININE 1.51*  --  1.77* 2.45*  CALCIUM  7.3*  --  7.3* 6.9*   LFT Recent Labs    09/16/24 0658  PROT 4.5*  ALBUMIN  1.9*  AST 906*  ALT 491*  ALKPHOS 249*  BILITOT 0.9   PT/INR Recent Labs    09/14/24 0805 09/15/24 0502  LABPROT 18.4* 18.9*  INR 1.5* 1.5*    Studies/Results: IR BILIARY DRAIN PLACEMENT WITH CHOLANGIOGRAM Result Date: 09/16/2024 INDICATION: Failed ERCP a patient with ascending cholangitis, elevated liver enzymes, bile duct stones. The ERCP failed secondary to the ampulla being in the duodenal diverticulum EXAM: Transhepatic percutaneous cholangiogram with internal/external biliary drain placement MEDICATIONS: 50 mL Omnipaque  300 ANESTHESIA/SEDATION: Moderate (conscious) sedation was employed during this procedure. A total of Versed  1 mg and Fentanyl  50 mcg was administered intravenously by the radiology nurse. Total intra-service moderate Sedation Time: 55 minutes. The patient's level of consciousness and vital signs were monitored continuously by radiology nursing throughout the procedure under my direct supervision. FLUOROSCOPY: Radiation Exposure Index (as provided by the fluoroscopic device): 280 mGy Kerma COMPLICATIONS: None immediate. PROCEDURE: Informed written consent was obtained from  the patient after a thorough discussion of the procedural risks, benefits and alternatives. All questions were addressed. Maximal Sterile Barrier Technique was utilized including caps, mask, sterile gowns, sterile gloves, sterile drape, hand hygiene and skin antiseptic. A timeout was  performed prior to the initiation of the procedure. With the patient in a right anterior oblique position on the IR table, the right upper quadrant was evaluated with ultrasound. Patient's skin was then marked, prepped, and draped in usual sterile fashion. Local anesthesia was achieved with 1% lidocaine . Using a combination of ultrasound and fluoroscopy, percutaneous access was achieved by advancing the needle from the skin through the liver into a intraparenchymal bile duct. Intraductal positioning was verified by injecting contrast under fluoroscopy visualizing contrast flowing in the right lobe hepatic ducts. Access purchase of the needle was suboptimal for catheter placement. This access was used to inject contrast and further achieve better access in a more inferior position within the right upper quadrant. A second needle was then advanced using combination of ultrasound and fluoroscopy until intraductal position was again verified. After the second needle was positioned within the hepatic ducts and a guidewire was advanced to the common bile duct was the initial access retrieved. An 018 guidewire was then advanced into the common bile duct. Contrast can then be seen flowing from the common bile duct into the duodenal diverticulum. Navigating the guidewire into the ampulla, duodenal diverticulum, and then into the duodenal proper was difficult, however this was finally achieved. Access was then exchanged for a stiff Glidewire and Berenstein catheter. Access was then exchanged for an Amplatz guidewire. The Amplatz guidewire was then advanced further into the duodenal and used to reduce the position out of the duodenal diverticulum. Once the guidewire was in a more optimal profile the access site was dilated using an 8 French and then 9 French dilator. A 10 French biliary drain was then advanced over the guidewire and into the duodenal. Contrast was again injected demonstrating satisfactory position and flow of  contrast into the duodenal as well as the common bile duct. Locking mechanism engaged. Retention suture and sterile dressing applied. Catheter was connected to gravity drainage. IMPRESSION: Satisfactory placement of a 10 French internal/external transhepatic biliary drain as described above. Electronically Signed   By: Cordella Banner   On: 09/16/2024 09:58   DG C-Arm 1-60 Min Result Date: 09/15/2024 EXAM: FLUOROSCOPIC IMAGING TECHNIQUE: Fluoroscopy was provided by the radiology department for procedure. Radiologist was not present during examination. RADIATION DOSE INDEX: Reference Air Kerma: 3.69 mGy Fluoroscopy time: 27 seconds. Fluoroscopy images: 7. COMPARISON: None available. CLINICAL HISTORY: acute cholecystitis and ERCP. FINDINGS: Intraoperative fluoroscopic imaging was performed. Several scout films were obtained during an ERCP. By history, the ERCP was unsuccessful. No contrast is visualized. IMPRESSION: 1. Intraoperative fluoroscopic imaging as above. Please refer to the operative report for full details. 2. Unsuccessful ERCP. Electronically signed by: Oneil Devonshire MD 09/15/2024 09:13 PM EST RP Workstation: [id number]   DG CHEST PORT 1 VIEW Result Date: 09/15/2024 CLINICAL DATA:  Hypoxemia. EXAM: PORTABLE CHEST 1 VIEW COMPARISON:  09/14/2024 FINDINGS: An endotracheal tube is 4.1 cm above the carina and appropriately positioned. Left jugular central line with the tip at the superior cavoatrial junction. Negative for a pneumothorax. Slightly increased densities at the medial left lung base with air bronchograms. Mild blunting at the costophrenic angles. Heart size is upper limits of normal but stable. Atherosclerotic calcifications at the aortic arch. IMPRESSION: 1. Slightly increased densities at the medial left lung base.  Findings could represent atelectasis or consolidation. 2. Support apparatuses as described. Negative for pneumothorax. Electronically Signed   By: Juliene Balder M.D.   On: 09/15/2024  14:39   IR Perc Cholecystostomy Result Date: 09/15/2024 INDICATION: Acute cholecystitis. EXAM: ULTRASOUND AND FLUOROSCOPIC-GUIDED CHOLECYSTOSTOMY TUBE PLACEMENT COMPARISON:  US  Abdomen, earlier same day.  CT AP, 09/13/2024. MEDICATIONS: The patient is currently admitted to the hospital and on intravenous antibiotics. Antibiotics were administered within an appropriate time frame prior to skin puncture. ANESTHESIA/SEDATION: Moderate (conscious) sedation was employed during this procedure. A total of Versed  0.5 mg and Fentanyl  25 mcg was administered intravenously. Moderate Sedation Time: 13 minutes. The patient's level of consciousness and vital signs were monitored continuously by radiology nursing throughout the procedure under my direct supervision. CONTRAST:  10mL OMNIPAQUE  IOHEXOL  300 MG/ML SOLN - administered into the gallbladder fossa. FLUOROSCOPY TIME:  Radiation Exposure Index and estimated peak skin dose (PSD); Reference air kerma (RAK), 3.4 mGy. COMPLICATIONS: None immediate. PROCEDURE: Informed written consent was obtained from the patient after a discussion of the risks, benefits and alternatives to treatment. Questions regarding the procedure were encouraged and answered. A timeout was performed prior to the initiation of the procedure. The right upper abdominal quadrant was prepped and draped in the usual sterile fashion, and a sterile drape was applied covering the operative field. Maximum barrier sterile technique with sterile gowns and gloves were used for the procedure. A timeout was performed prior to the initiation of the procedure. Local anesthesia was provided with 1% lidocaine  with epinephrine . Ultrasound scanning of the RIGHT upper quadrant demonstrates a distended gallbladder. Of note, the patient reported pain with ultrasound imaging over the gallbladder. Utilizing a transhepatic approach, a 22 gauge needle was advanced into the gallbladder under direct ultrasound guidance. An ultrasound  image was saved for documentation purposes. Appropriate intraluminal puncture was confirmed with the efflux of bile and advancement of an 0.018 wire into the gallbladder lumen. The needle was exchanged for an Accustick set. A small amount of contrast was injected to confirm appropriate intraluminal positioning. Over a Benson wire, a 10 Fr cholecystomy tube was advanced into the gallbladder fossa, coiled and locked. Bile was aspirated and a small amount of contrast was injected as several post procedural spot radiographic images were obtained in various obliquities. The catheter was secured to the skin with suture, connected to a drainage bag and a dressing was placed. The patient tolerated the procedure well without immediate post procedural complication. IMPRESSION: Successful ultrasound and fluoroscopic guided placement of a 10 Fr cholecystostomy tube. RECOMMENDATIONS: The patient will return to Vascular Interventional Radiology (VIR) for routine drainage catheter evaluation and exchange in 6-8 weeks. Thom Hall, MD [facility] Electronically Signed   By: Thom Hall M.D.   On: 09/15/2024 06:59   DG CHEST PORT 1 VIEW Result Date: 09/14/2024 CLINICAL DATA:  Central line placement. EXAM: PORTABLE CHEST 1 VIEW COMPARISON:  Chest radiograph dated 09/13/2024. FINDINGS: Left-sided central venous line with tip in the region of the cavoatrial junction. No focal consolidation, pleural effusion or pneumothorax. Stable cardiomegaly. Moderate size hiatal hernia. No acute osseous pathology. IMPRESSION: Left-sided central venous line with tip in the region of the cavoatrial junction. No pneumothorax. Electronically Signed   By: Vanetta Chou M.D.   On: 09/14/2024 17:51       Assessment / Plan:    #12 89 year old white male admitted to [facility] 09/13/1998 2:29 week history of flulike symptoms with fever, malaise then developed  abdominal pain nausea and  vomiting precipitated ER visit  Since diagnosed with biliary sepsis, E. coli bacteremia, acute cholecystitis and cholangitis. Transferred here for cholecystostomy drain placement done 09/14/2024  ERCP yesterday unsuccessful due to his anatomy with several duodenal diverticuli He is now status post percutaneous biliary drainage per IR  IV Rocephin  and metronidazole   Able to be weaned off of Levophed  earlier this afternoon.  Blood pressures dipped to 85 while I was in the room.  #2 acute kidney injury secondary to sepsis-seething IV fluids and IV albumin  today #3 mild metabolic encephalopathy secondary to sepsis  Plan; okay to advance diet to full liquids Pressure soft, may need to go back on pressor Continue to trend daily labs Plan from GI perspective will be for arrangement of rendezvous procedure with IR/Dr. [name] in 4 to 6 weeks.  GI will continue to follow peripherally this weekend available if needed. Discussed with family at bedside and updated.    Principal Problem:   Acute cholecystitis Active Problems:   Choledocholithiasis with acute cholecystitis     LOS: 3 days   Amy EsterwoodPA-C  09/16/2024, 3:20 PM      Attending Physician's Attestation   I have taken an interval history, reviewed the chart and examined the patient.   Greatly appreciate IR placing percutaneous transhepatic biliary drain.  Decreasing leukocytosis with improving lactate.  Cholecystostomy tube percutaneous biliary drain both in adequate position on examination today.  No progressive worsening of pain.  Will need ERCP rendezvous for attempt at biliary stone extraction in 4 to 6 weeks with follow-up in clinic both with IR and myself.  My office will work on arranging this.  Thankfully, he is hemodynamically improving and clinically improving as well.  At this point, no further inpatient GI workup to be pursued.  We can be available if necessary please contact if needed.  I agree with the  Advanced Practitioner's note, impression, and recommendations with updates and my documentation as noted above.  The majority of the medical decision making/process, formulation of the impression/plan of action for the patient were performed by me with substantive portion of this encounter (>50% time spent including complete performance of at least one of the key components of MDM, History, and/or Exam).   Aloha Finner, MD [facility] Office # [phone]

## 2024-09-16 NOTE — Evaluation (Signed)
 Physical Therapy Evaluation Patient Details Name: Benjamin [name] MRN: [id number] DOB: May 23, 1933 Today's Date: 09/16/2024  History of Present Illness  Pt is a 89 y/o male presenting on 1/26 due to 2 week flu like symptoms, weakness, and decreased oral intake. CT with cholelithiasis and choledocholithiasis, suspicion for acute cholecystitis. CT angio negative PE but showed large iatal hernia. 09/14/24 transferred to [facility], s/p cholecystotomy tube placement. 1/29 s/p ERCP, per radiology failed but placed drainage cath.  PMH includes: thyroid disease, urinary retention, prior back surgery.   Clinical Impression  Pt admitted with above diagnosis. PTA pt lived at home with his wife, mod I mobility with SPC. Pt currently with functional limitations due to the deficits listed below (see PT Problem List). On eval, pt required max assist rolling, and +2 total assist supine<>sit. Unable to clear bottom from bed for stand. Poor sitting balance. VSS on RA. Pt fatigued quickly. Pt will benefit from acute skilled PT to increase their independence and safety with mobility to allow discharge. Post acute, pt would benefit from further therapy in inpatient setting < 3 hours/day.         If plan is discharge home, recommend the following: Two people to help with walking and/or transfers;Two people to help with bathing/dressing/bathroom   Can travel by private vehicle   No    Equipment Recommendations Other (comment) (TBD)  Recommendations for Other Services       Functional Status Assessment Patient has had a recent decline in their functional status and demonstrates the ability to make significant improvements in function in a reasonable and predictable amount of time.     Precautions / Restrictions Precautions Precautions: Fall;Other (comment) Recall of Precautions/Restrictions: Impaired Precaution/Restrictions Comments: 2 drains R abd, foley      Mobility  Bed Mobility Overal bed mobility: Needs  Assistance Bed Mobility: Rolling, Supine to Sit, Sit to Sidelying Rolling: Max assist   Supine to sit: Total assist, +2 for physical assistance, HOB elevated   Sit to sidelying: Total assist, +2 for physical assistance      Transfers Overall transfer level: Needs assistance                 General transfer comment: attempted stand from EOB with total assist +2 but unsucessful    Ambulation/Gait                  Stairs            Wheelchair Mobility     Tilt Bed    Modified Rankin (Stroke Patients Only)       Balance Overall balance assessment: Needs assistance Sitting-balance support: No upper extremity supported, Feet supported, Single extremity supported Sitting balance-Leahy Scale: Poor Sitting balance - Comments: assist required to maintain balance EOB Postural control: Posterior lean                                   Pertinent Vitals/Pain Pain Assessment Pain Assessment: Faces Faces Pain Scale: Hurts a little bit Pain Location: R abdomen Pain Descriptors / Indicators: Discomfort Pain Intervention(s): Monitored during session    Home Living Family/patient expects to be discharged to:: Private residence Living Arrangements: Spouse/significant other;Other relatives (niece) Available Help at Discharge: Family;Available 24 hours/day Type of Home: House Home Access: Stairs to enter Entrance Stairs-Rails: Right Entrance Stairs-Number of Steps: 2   Home Layout: One level Home Equipment: Cane - single Librarian, Academic (2 wheels)  Prior Function Prior Level of Function : Independent/Modified Independent;Driving             Mobility Comments: cane at BL ADLs Comments: cares for spouse with dementia but mainly helps with meds, neice present as well.  independent ADLs and IADLs (grocery shops)     Extremity/Trunk Assessment   Upper Extremity Assessment Upper Extremity Assessment: Generalized weakness;Right  hand dominant    Lower Extremity Assessment Lower Extremity Assessment: Generalized weakness    Cervical / Trunk Assessment Cervical / Trunk Assessment: Kyphotic  Communication   Communication Communication: No apparent difficulties    Cognition Arousal: Alert Behavior During Therapy: WFL for tasks assessed/performed   PT - Cognitive impairments: Orientation, Memory, Initiation, Sequencing, Problem solving   Orientation impairments: Time, Situation                     Following commands: Impaired Following commands impaired: Follows one step commands inconsistently, Follows one step commands with increased time     Cueing Cueing Techniques: Verbal cues, Tactile cues, Visual cues     General Comments General comments (skin integrity, edema, etc.): VSS on RA. Pt with c/o dizziness upon sitting. BP checked and stable    Exercises     Assessment/Plan    PT Assessment Patient needs continued PT services  PT Problem List Decreased strength;Decreased mobility;Decreased activity tolerance;Decreased cognition;Decreased balance;Pain;Decreased knowledge of use of DME       PT Treatment Interventions DME instruction;Therapeutic exercise;Gait training;Balance training;Functional mobility training;Therapeutic activities;Patient/family education;Cognitive remediation    PT Goals (Current goals can be found in the Care Plan section)  Acute Rehab PT Goals Patient Stated Goal: not stated PT Goal Formulation: With patient Time For Goal Achievement: 09/30/24 Potential to Achieve Goals: Good    Frequency Min 2X/week     Co-evaluation   Reason for Co-Treatment: Complexity of the patient's impairments (multi-system involvement);To address functional/ADL transfers;For patient/therapist safety PT goals addressed during session: Mobility/safety with mobility;Balance OT goals addressed during session: ADL's and self-care       AM-PAC PT 6 Clicks Mobility  Outcome  Measure Help needed turning from your back to your side while in a flat bed without using bedrails?: A Lot Help needed moving from lying on your back to sitting on the side of a flat bed without using bedrails?: Total Help needed moving to and from a bed to a chair (including a wheelchair)?: Total Help needed standing up from a chair using your arms (e.g., wheelchair or bedside chair)?: Total Help needed to walk in hospital room?: Total Help needed climbing 3-5 steps with a railing? : Total 6 Click Score: 7    End of Session   Activity Tolerance: Patient tolerated treatment well Patient left: in bed;with call bell/phone within reach;with nursing/sitter in room;with family/visitor present Nurse Communication: Mobility status PT Visit Diagnosis: Muscle weakness (generalized) (M62.81);Difficulty in walking, not elsewhere classified (R26.2)    Time: 9048-8987 PT Time Calculation (min) (ACUTE ONLY): 21 min   Charges:   PT Evaluation $PT Eval Moderate Complexity: 1 Mod   PT General Charges $$ ACUTE PT VISIT: 1 Visit         Sari MATSU., PT  Office # [phone]   Erven Sari Shaker 09/16/2024, 11:07 AM

## 2024-09-16 NOTE — Progress Notes (Signed)
 1 Day Post-Op   Subjective/Chief Complaint: Events yesterday noted, feels ok today   Objective: Vital signs in last 24 hours: Temp:  [96.8 F (36 C)-98.2 F (36.8 C)] 97.9 F (36.6 C) (01/30 0715) Pulse Rate:  [82-107] 97 (01/30 0715) Resp:  [14-30] 26 (01/30 0715) BP: (79-147)/(47-94) 108/65 (01/30 0715) SpO2:  [85 %-100 %] 95 % (01/30 0715) Weight:  [57.9 kg] 57.9 kg (01/29 1134) Last BM Date : 09/14/24  Intake/Output from previous day: 01/29 0701 - 01/30 0700 In: 4035.8 [I.V.:2316; IV Piggyback:1689.8] Out: 730 [Urine:75; Drains:655] Intake/Output this shift: No intake/output data recorded.  Ab nontender drains both bilious  Lab Results:  Recent Labs    09/14/24 0309 09/14/24 1920 09/15/24 0502  WBC 3.6*  --  28.9*  HGB 12.3* 9.9* 10.6*  HCT 39.3 29.0* 31.9*  PLT 351  --  260   BMET Recent Labs    09/14/24 1913 09/14/24 1920 09/15/24 0502  NA 139 138 135  K 4.2 4.0 4.9  CL 106  --  101  CO2 18*  --  19*  GLUCOSE 155*  --  147*  BUN 29*  --  33*  CREATININE 1.51*  --  1.77*  CALCIUM  7.3*  --  7.3*   PT/INR Recent Labs    09/14/24 0805 09/15/24 0502  LABPROT 18.4* 18.9*  INR 1.5* 1.5*   ABG Recent Labs    09/13/24 1759 09/14/24 1920  PHART  --  7.410  HCO3 25.4 14.8*    Studies/Results: DG C-Arm 1-60 Min Result Date: 09/15/2024 EXAM: FLUOROSCOPIC IMAGING TECHNIQUE: Fluoroscopy was provided by the radiology department for procedure. Radiologist was not present during examination. RADIATION DOSE INDEX: Reference Air Kerma: 3.69 mGy Fluoroscopy time: 27 seconds. Fluoroscopy images: 7. COMPARISON: None available. CLINICAL HISTORY: acute cholecystitis and ERCP. FINDINGS: Intraoperative fluoroscopic imaging was performed. Several scout films were obtained during an ERCP. By history, the ERCP was unsuccessful. No contrast is visualized. IMPRESSION: 1. Intraoperative fluoroscopic imaging as above. Please refer to the operative report for full details.  2. Unsuccessful ERCP. Electronically signed by: Oneil Devonshire MD 09/15/2024 09:13 PM EST RP Workstation: [id number]   DG CHEST PORT 1 VIEW Result Date: 09/15/2024 CLINICAL DATA:  Hypoxemia. EXAM: PORTABLE CHEST 1 VIEW COMPARISON:  09/14/2024 FINDINGS: An endotracheal tube is 4.1 cm above the carina and appropriately positioned. Left jugular central line with the tip at the superior cavoatrial junction. Negative for a pneumothorax. Slightly increased densities at the medial left lung base with air bronchograms. Mild blunting at the costophrenic angles. Heart size is upper limits of normal but stable. Atherosclerotic calcifications at the aortic arch. IMPRESSION: 1. Slightly increased densities at the medial left lung base. Findings could represent atelectasis or consolidation. 2. Support apparatuses as described. Negative for pneumothorax. Electronically Signed   By: Juliene Balder M.D.   On: 09/15/2024 14:39   IR Perc Cholecystostomy Result Date: 09/15/2024 INDICATION: Acute cholecystitis. EXAM: ULTRASOUND AND FLUOROSCOPIC-GUIDED CHOLECYSTOSTOMY TUBE PLACEMENT COMPARISON:  US  Abdomen, earlier same day.  CT AP, 09/13/2024. MEDICATIONS: The patient is currently admitted to the hospital and on intravenous antibiotics. Antibiotics were administered within an appropriate time frame prior to skin puncture. ANESTHESIA/SEDATION: Moderate (conscious) sedation was employed during this procedure. A total of Versed  0.5 mg and Fentanyl  25 mcg was administered intravenously. Moderate Sedation Time: 13 minutes. The patient's level of consciousness and vital signs were monitored continuously by radiology nursing throughout the procedure under my direct supervision. CONTRAST:  10mL OMNIPAQUE  IOHEXOL  300 MG/ML SOLN -  administered into the gallbladder fossa. FLUOROSCOPY TIME:  Radiation Exposure Index and estimated peak skin dose (PSD); Reference air kerma (RAK), 3.4 mGy. COMPLICATIONS: None immediate. PROCEDURE: Informed written  consent was obtained from the patient after a discussion of the risks, benefits and alternatives to treatment. Questions regarding the procedure were encouraged and answered. A timeout was performed prior to the initiation of the procedure. The right upper abdominal quadrant was prepped and draped in the usual sterile fashion, and a sterile drape was applied covering the operative field. Maximum barrier sterile technique with sterile gowns and gloves were used for the procedure. A timeout was performed prior to the initiation of the procedure. Local anesthesia was provided with 1% lidocaine  with epinephrine . Ultrasound scanning of the RIGHT upper quadrant demonstrates a distended gallbladder. Of note, the patient reported pain with ultrasound imaging over the gallbladder. Utilizing a transhepatic approach, a 22 gauge needle was advanced into the gallbladder under direct ultrasound guidance. An ultrasound image was saved for documentation purposes. Appropriate intraluminal puncture was confirmed with the efflux of bile and advancement of an 0.018 wire into the gallbladder lumen. The needle was exchanged for an Accustick set. A small amount of contrast was injected to confirm appropriate intraluminal positioning. Over a Benson wire, a 10 Fr cholecystomy tube was advanced into the gallbladder fossa, coiled and locked. Bile was aspirated and a small amount of contrast was injected as several post procedural spot radiographic images were obtained in various obliquities. The catheter was secured to the skin with suture, connected to a drainage bag and a dressing was placed. The patient tolerated the procedure well without immediate post procedural complication. IMPRESSION: Successful ultrasound and fluoroscopic guided placement of a 10 Fr cholecystostomy tube. RECOMMENDATIONS: The patient will return to Vascular Interventional Radiology (VIR) for routine drainage catheter evaluation and exchange in 6-8 weeks. Thom Hall,  MD [facility] Electronically Signed   By: Thom Hall M.D.   On: 09/15/2024 06:59   DG CHEST PORT 1 VIEW Result Date: 09/14/2024 CLINICAL DATA:  Central line placement. EXAM: PORTABLE CHEST 1 VIEW COMPARISON:  Chest radiograph dated 09/13/2024. FINDINGS: Left-sided central venous line with tip in the region of the cavoatrial junction. No focal consolidation, pleural effusion or pneumothorax. Stable cardiomegaly. Moderate size hiatal hernia. No acute osseous pathology. IMPRESSION: Left-sided central venous line with tip in the region of the cavoatrial junction. No pneumothorax. Electronically Signed   By: Vanetta Chou M.D.   On: 09/14/2024 17:51   US  Abdomen Limited Result Date: 09/14/2024 CLINICAL DATA:  [id number] Cholecystitis, acute with cholelithiasis [id number] EXAM: ULTRASOUND ABDOMEN LIMITED RIGHT UPPER QUADRANT COMPARISON:  CT AP, 09/13/2024.  CTA PE, 09/13/2024 FINDINGS: Gallbladder: Distended, with layering echogenic debris and gallstones. Gallbladder wall thickening, measuring greater than the upper limit of normal. POSITIVE pericholecystic fluid. No sonographic Murphy sign noted by sonographer, though patient reportedly on pain medication. Common bile duct: Diameter: Dilated, measuring up to 17 mm of the distal CBD. Liver: No focal lesion identified. Within normal limits in parenchymal echogenicity. Portal vein is patent on color Doppler imaging with normal direction of blood flow towards the liver. Other: No perihepatic free fluid. IMPRESSION: 1. Distended gallbladder with stones, sludge and pericholecystic fluid. Findings favored consistent with acute calculus cholecystitis, despite absence of sonographic Murphy's sign 2. Common bile duct distention, suspicious for choledocholithiasis. Electronically Signed   By: Thom Hall M.D.   On: 09/14/2024 17:30    Anti-infectives: Anti-infectives (From admission, onward)  Start      Dose/Rate Route Frequency Ordered Stop   09/15/24 1615  cefOXitin  (MEFOXIN ) 2 g in sodium chloride  0.9 % 100 mL IVPB       Note to Pharmacy: TO IR   2 g 200 mL/hr over 30 Minutes Intravenous To Radiology 09/15/24 1525 09/16/24 1615   09/15/24 1300  cefTRIAXone  (ROCEPHIN ) 2 g in sodium chloride  0.9 % 100 mL IVPB        2 g 200 mL/hr over 30 Minutes Intravenous Every 24 hours 09/15/24 0852     09/15/24 1000  metroNIDAZOLE  (FLAGYL ) IVPB 500 mg        500 mg 100 mL/hr over 60 Minutes Intravenous 2 times daily 09/15/24 0852     09/14/24 0600  piperacillin -tazobactam (ZOSYN ) IVPB 3.375 g  Status:  Discontinued        3.375 g 12.5 mL/hr over 240 Minutes Intravenous Every 8 hours 09/14/24 0250 09/15/24 0852   09/13/24 2345  piperacillin -tazobactam (ZOSYN ) IVPB 3.375 g        3.375 g 100 mL/hr over 30 Minutes Intravenous  Once 09/13/24 2248 09/14/24 0043       Assessment/Plan: Cholecystitis ? Cholangitis E coli bacteremia -appreciate GI, IR and CCM care -nothing surgical at this point -could have a diet from my standpoint -at some point conversation about lap chole but this would be at least six weeks out -will follow up Monday  Donnice Bury 09/16/2024

## 2024-09-16 NOTE — Evaluation (Signed)
 Occupational Therapy Evaluation Patient Details Name: Benjamin [name] MRN: [id number] DOB: 21-May-1933 Today's Date: 09/16/2024   History of Present Illness   Pt is a 89 y/o male presenting on 1/26 due to 2 week flu like symptoms, weakness, and decreased oral intake. CT with cholelithiasis and choledocholithiasis, suspicion for acute cholecystitis. CT angio negative PE but showed large iatal hernia. 09/14/24 transferred to [facility], s/p cholecystotomy tube placement. 1/29 s/p ERCP, per radiology failed but placed drainage cath.  PMH includes: thyroid disease, urinary retention, prior back surgery.     Clinical Impressions PTA patient independent and driving, using cane for mobility and caring for his spouse with dementia.  Admitted for above and presents with problem list below.  Today, pt requires total assist +2 for bed mobility, physical assist to maintain sitting at EOB due to posterior lean.  Needing max to total assist for Adls.  Poor activity tolerance, but VSS throughout session.  RN aware of drainage from abdominal drains, present throughout session to monitor.  Pt oriented to self, place and year but otherwise needing increased time to follow simple 1 step commands and remains inconsistent. Based on performance today, believe patient will best benefit from continued OT services acutely and after dc at inpatient setting with <3hrs/day to optimize independence, safety with ADLs and mobility.      If plan is discharge home, recommend the following:   Two people to help with walking and/or transfers;Two people to help with bathing/dressing/bathroom;Direct supervision/assist for medications management;Direct supervision/assist for financial management;Assist for transportation;Help with stairs or ramp for entrance;Assistance with cooking/housework;Supervision due to cognitive status     Functional Status Assessment   Patient has had a recent decline in their functional status and demonstrates  the ability to make significant improvements in function in a reasonable and predictable amount of time.     Equipment Recommendations   Other (comment) (defer)     Recommendations for Other Services         Precautions/Restrictions   Precautions Precautions: Fall Recall of Precautions/Restrictions: Impaired Precaution/Restrictions Comments: 2 drains R abd Restrictions Weight Bearing Restrictions Per Provider Order: No     Mobility Bed Mobility Overal bed mobility: Needs Assistance Bed Mobility: Rolling, Supine to Sit, Sit to Sidelying Rolling: Total assist, +2 for physical assistance   Supine to sit: Total assist, +2 for physical assistance   Sit to sidelying: Total assist, +2 for physical assistance General bed mobility comments: transitioned to EOB towards L side, needing total assist +2; returned back to sidelying and rolling in bed to change pad with total asisst    Transfers                   General transfer comment: attempted stand from EOB with total assist +2 but unsucessful      Balance Overall balance assessment: Needs assistance Sitting-balance support: No upper extremity supported, Feet supported, Single extremity supported Sitting balance-Leahy Scale: Poor Sitting balance - Comments: posterior lean, needing external support Postural control: Posterior lean                                 ADL either performed or assessed with clinical judgement   ADL Overall ADL's : Needs assistance/impaired     Grooming: Maximal assistance;Sitting           Upper Body Dressing : Maximal assistance;Sitting   Lower Body Dressing: Total assistance;+2 for physical assistance;Sitting/lateral leans;Bed level  Toilet Transfer Details (indicate cue type and reason): deferred         Functional mobility during ADLs: Total assistance;+2 for physical assistance       Vision   Vision Assessment?: No apparent visual deficits      Perception         Praxis         Pertinent Vitals/Pain Pain Assessment Pain Assessment: No/denies pain     Extremity/Trunk Assessment Upper Extremity Assessment Upper Extremity Assessment: Generalized weakness;Right hand dominant   Lower Extremity Assessment Lower Extremity Assessment: Defer to PT evaluation       Communication Communication Communication: No apparent difficulties   Cognition Arousal: Alert Behavior During Therapy: WFL for tasks assessed/performed Cognition: Cognition impaired   Orientation impairments: Situation, Time Awareness: Intellectual awareness impaired Memory impairment (select all impairments): Short-term memory, Working civil service fast streamer, Non-declarative long-term memory, Geneticist, Molecular long-term memory Attention impairment (select first level of impairment): Sustained attention Executive functioning impairment (select all impairments): Initiation, Organization, Sequencing, Reasoning, Problem solving OT - Cognition Comments: pt oriented to self and year but not month. follows simple commands with increased time.  Poor ability to sequence.                 Following commands: Impaired Following commands impaired: Follows one step commands inconsistently, Follows one step commands with increased time     Cueing  General Comments   Cueing Techniques: Verbal cues;Tactile cues;Visual cues  VSS, son at side and supportive; RN aware of drainage from drains   Exercises     Shoulder Instructions      Home Living Family/patient expects to be discharged to:: Private residence Living Arrangements: Spouse/significant other;Other (Comment) (niece) Available Help at Discharge: Family;Available 24 hours/day Type of Home: House Home Access: Stairs to enter [employer] of Steps: 2 Entrance Stairs-Rails: Right Home Layout: One level     Bathroom Shower/Tub: Chief Strategy Officer: Standard     Home Equipment: Cane - single  Librarian, Academic (2 wheels)          Prior Functioning/Environment Prior Level of Function : Independent/Modified Independent;Driving             Mobility Comments: using cane ADLs Comments: cares for spouse with dementia but mainly helps with meds, neice present as well.  independent ADLs and IADLs (grocery shops)    OT Problem List: Decreased strength;Decreased activity tolerance;Impaired balance (sitting and/or standing);Decreased safety awareness;Decreased knowledge of use of DME or AE;Decreased knowledge of precautions;Decreased cognition;Decreased coordination   OT Treatment/Interventions: Self-care/ADL training;Therapeutic exercise;DME and/or AE instruction;Therapeutic activities;Patient/family education;Balance training;Cognitive remediation/compensation;Energy conservation      OT Goals(Current goals can be found in the care plan section)   Acute Rehab OT Goals Patient Stated Goal: get better OT Goal Formulation: With patient Time For Goal Achievement: 09/30/24 Potential to Achieve Goals: Good   OT Frequency:  Min 2X/week    Co-evaluation PT/OT/SLP Co-Evaluation/Treatment: Yes Reason for Co-Treatment: Complexity of the patient's impairments (multi-system involvement);To address functional/ADL transfers;For patient/therapist safety PT goals addressed during session: Mobility/safety with mobility OT goals addressed during session: ADL's and self-care      AM-PAC OT 6 Clicks Daily Activity     Outcome Measure Help from another person eating meals?: A Lot Help from another person taking care of personal grooming?: A Lot Help from another person toileting, which includes using toliet, bedpan, or urinal?: Total Help from another person bathing (including washing, rinsing, drying)?: A Lot Help from another person to put on and  taking off regular upper body clothing?: A Lot Help from another person to put on and taking off regular lower body clothing?: Total 6  Click Score: 10   End of Session Nurse Communication: Mobility status  Activity Tolerance: Patient tolerated treatment well Patient left: in bed;with call bell/phone within reach;with bed alarm set;with family/visitor present;with nursing/sitter in room;with SCD's reapplied  OT Visit Diagnosis: Other abnormalities of gait and mobility (R26.89);Muscle weakness (generalized) (M62.81);Other symptoms and signs involving cognitive function                Time: 9046-8988 OT Time Calculation (min): 18 min Charges:  OT General Charges $OT Visit: 1 Visit OT Evaluation $OT Eval High Complexity: 1 High  Etta NOVAK, OT Acute Rehabilitation Services Office [phone] Secure Chat Preferred    Etta GORMAN Hope 09/16/2024, 10:37 AM

## 2024-09-16 NOTE — NC FL2 (Signed)
 " [place]  MEDICAID FL2 LEVEL OF CARE FORM     IDENTIFICATION  Patient Name: Benjamin [name] Birthdate: 08/18/1933 Sex: male Admission Date (Current Location): 09/13/2024  [facility] and [place] Number:  Producer, Television/film/video and Address:  The [place] [address], [place], [place] [address]      Provider Number: [id number]  Attending Physician Name and Address:  Kara Dorn NOVAK, MD  Relative Name and Phone Number:  Jahshua, [place], Emergency Contact  [phone]    Current Level of Care: Hospital Recommended Level of Care: Skilled Nursing Facility Prior Approval Number:    Date Approved/Denied:   PASRR Number: [id number]  Discharge Plan: SNF    Current Diagnoses: Patient Active Problem List   Diagnosis Date Noted   Choledocholithiasis with acute cholecystitis 09/14/2024   Acute cholecystitis 09/13/2024   Urinary retention 07/09/2021   History of UTI 07/09/2021   Abnormal finding on GI tract imaging    Constipation 11/10/2016   Acute lower UTI 11/10/2016   Acute renal failure (ARF) 11/10/2016   Thrush 11/10/2016   Hematuria 10/31/2014   B12 DEFICIENCY 04/16/2009   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   DISC DISEASE, LUMBOSACRAL SPINE 12/11/2008   SEBACEOUS CYST, INFECTED 12/04/2008   KNEE PAIN, LEFT 11/27/2008   Duodenitis 10/02/2008   Hypothyroidism 07/25/2008   CLOSTRIDIUM DIFFICILE COLITIS 07/18/2008   GERD 07/18/2008   Peptic ulcer 07/18/2008   BPH with obstruction/lower urinary tract symptoms 07/18/2008   MALAISE AND FATIGUE 07/18/2008    Orientation RESPIRATION BLADDER Height & Weight     Self, Time, Place  Normal Continent Weight: 127 lb 10.3 oz (57.9 kg) (from chart) Height:  5' 7 (170.2 cm)  BEHAVIORAL SYMPTOMS/MOOD NEUROLOGICAL BOWEL NUTRITION STATUS        Diet (see dc summary)  AMBULATORY STATUS COMMUNICATION OF NEEDS Skin   Total Care Verbally Other (Comment) (ecchymosis/erythema)                        Personal Care Assistance Level of Assistance  Bathing, Dressing, Feeding Bathing Assistance: Maximum assistance Feeding assistance: Independent Dressing Assistance:  (not tested)     Functional Limitations Info  Sight, Hearing, Speech Sight Info: Impaired Hearing Info: Adequate Speech Info: Adequate    SPECIAL CARE FACTORS FREQUENCY  PT (By licensed PT), OT (By licensed OT)     PT Frequency: 5x/wk OT Frequency: 5x/wk            Contractures Contractures Info: Not present    Additional Factors Info  Code Status, Allergies Code Status Info: FULL Allergies Info: NKA           Current Medications (09/16/2024):  This is the current hospital active medication list Current Facility-Administered Medications  Medication Dose Route Frequency Provider Last Rate Last Admin   acetaminophen  (TYLENOL ) tablet 650 mg  650 mg Oral Q6H PRN Ricky Fines, MD   650 mg at 09/14/24 9160   Or   acetaminophen  (TYLENOL ) suppository 650 mg  650 mg Rectal Q6H PRN Ricky Fines, MD       albumin  human 25 % solution 25 g  25 g Intravenous Q6H Dolan Mateo Larger, MD 60 mL/hr at 09/16/24 1440 25 g at 09/16/24 1440   baclofen  (LIORESAL ) tablet 5 mg  5 mg Oral Once PRN Marion Damien DASEN, MD       cefTRIAXone  (ROCEPHIN ) 2 g in sodium chloride  0.9 % 100 mL IVPB  2 g Intravenous Q24H Sharie Bourbon, MD  200 mL/hr at 09/16/24 1300 Infusion Verify at 09/16/24 1300   Chlorhexidine  Gluconate Cloth 2 % PADS 6 each  6 each Topical Daily Ricky Fines, MD   6 each at 09/16/24 9074   feeding supplement (BOOST / RESOURCE BREEZE) liquid 1 Container  1 Container Oral TID BM Kara Dorn NOVAK, MD   1 Container at 09/16/24 1306   heparin  injection 5,000 Units  5,000 Units Subcutaneous Q8H Amend, Caron G, RPH   5,000 Units at 09/16/24 1306   HYDROmorphone  (DILAUDID ) injection 0.5 mg  0.5 mg Intravenous Q3H PRN Marion Damien DASEN, MD   0.5 mg at 09/15/24 0502   lactated ringers  infusion   Intravenous  Continuous Rosan Deward ORN, NP 125 mL/hr at 09/16/24 1524 New Bag at 09/16/24 1524   metroNIDAZOLE  (FLAGYL ) IVPB 500 mg  500 mg Intravenous Q12H Rosan Deward ORN, NP       norepinephrine  (LEVOPHED ) 4mg  in (0.016 mg/mL) premix infusion  0-40 mcg/min Intravenous Titrated Sharie Bourbon, MD   Stopped at 09/16/24 1226   ondansetron  (ZOFRAN ) tablet 4 mg  4 mg Oral Q6H PRN Ricky Fines, MD       Or   ondansetron  (ZOFRAN ) injection 4 mg  4 mg Intravenous Q6H PRN Ricky Fines, MD   4 mg at 09/14/24 2036   pantoprazole  (PROTONIX ) injection 40 mg  40 mg Intravenous Q24H Ricky Fines, MD   40 mg at 09/16/24 0548   perflutren  lipid microspheres (DEFINITY ) IV suspension  1-10 mL Intravenous PRN Rosan Deward ORN, NP   3 mL at 09/16/24 1425   prochlorperazine  (COMPAZINE ) injection 10 mg  10 mg Intravenous Q6H PRN Ricky Fines, MD   10 mg at 09/15/24 0000   sodium chloride  flush (NS) 0.9 % injection 5 mL  5 mL Intracatheter Q8H Mugweru, Jon, MD   5 mL at 09/16/24 1307   sodium chloride  flush (NS) 0.9 % injection 5 mL  5 mL Intracatheter Q8H Jenna Cordella LABOR, MD   5 mL at 09/16/24 1306     Discharge Medications: Please see discharge summary for a list of discharge medications.  Relevant Imaging Results:  Relevant Lab Results:   Additional Information SSN: [id number]  Lendia Dais, LCSWA     "

## 2024-09-16 NOTE — Progress Notes (Signed)
 "  NAME:  Benjamin [name], MRN:  [id number], DOB:  30-Oct-1932, LOS: 3 ADMISSION DATE:  09/13/2024, CONSULTATION DATE:  09/14/24 REFERRING MD:  Dr. Ricky, CHIEF COMPLAINT:  septic shock   History of Present Illness:   73 yoM with PMH of BPH with urinary retention, HLD, thyroid disease, PUD/ GERD, Cdiff (2009), and duodenitis who presented from home to [facility] on 1/27 with 2 week history of body aches, weakness, subjective fever, decreased oral intake and onset of abd pain with vomiting after eating.  Found to have elevated LFTs, lactic, with blood cultures positive for e. Coli bacteremia.  CT a/p showing acute cholelithiasis and choledocholithiasis with intrahepatic and extrahepatic biliary ductal dilation.  Pt with worsening septic shock requiring peripheral NE.  CCS and IR consulted, pt to be transferred to [facility] for percutaneous cholecystostomy drain given hemodynamic instability, not a surgical candidate currently.  PCCM to admit.   Pertinent  Medical History  BPH with urinary retention, HLD, thyroid disease, PUD/ GERD, Cdiff (2009), duodenitis,   Significant Hospital Events: Including procedures, antibiotic start and stop dates in addition to other pertinent events   1/27 admit [facility] 1/28 tx to [facility], chole tube 1/29 ERCP unable to cannulate biliary tree. Off pressors. Biliary drain place by IR.   Interim History / Subjective:   Failed ERCP yesterday due to unique anatomy. Biliary drain placed.  Complaining of dry mouth One of his drains is not draining. IR aware.   Objective    Blood pressure 108/65, pulse 97, temperature 97.9 F (36.6 C), resp. rate (!) 26, height 5' 7 (1.702 m), weight 57.9 kg, SpO2 95%.        Intake/Output Summary (Last 24 hours) at 09/16/2024 0936 Last data filed at 09/16/2024 0700 Gross per 24 hour  Intake 3477.83 ml  Output 730 ml  Net 2747.83 ml   Filed Weights   09/14/24 0250 09/14/24 1641 09/15/24 1134  Weight: 52.9 kg 57.9 kg 57.9 kg    Examination:   General: thin elderly appearing male in NAD HEENT: [place]/AT, PERRL Chest: Clear bilateral breath sounds Abdomen: two right sided abdominal drains with dark bilious drainage.  Extremities: no acute deformity or ROM limitation Neuro: somnolent, but will arouse to participate in exam  BC e. Coli sens to ceftriaxone  GNR in biliary culture BMP pending WBC 21.8, Hgb 10.3, Plt 157   Resolved problem list   Assessment and Plan   E. coli bacteremia Cholecystitis, cholangitis/p PERC Chole on 1/28, biliary drain 1/29. ERCP attempted, but unable.  - Continue ceftriaxone  - DC flagyl  - drains per IR for source control - Appreciate surgery and GI  Septic shock secondary to above - NE at - echo  Acute metabolic encephalopathy  - related to sepsis, bacteremia.  - improving  AKI with decreased urinary output - UOP remains poor - complaining of thirst - will try some more volume.  - reach out to nephrology for further recs.   Hypothyroidism -subtherapeutic TSH 15, T4 1.07 - was supposed to be on treatment at home, but hasn't been taking it.  - monitor  PAF with RVR - now rate controlled, CHAD2S@-VASC 2 - Tele - holding AC for now pending instrumentation.   Keep in ICU for pressor needs         Labs   CBC: Recent Labs  Lab 09/13/24 1756 09/14/24 0309 09/14/24 1920 09/15/24 0502  WBC 11.1* 3.6*  --  28.9*  NEUTROABS 9.0*  --   --   --  HGB 12.0* 12.3* 9.9* 10.6*  HCT 37.3* 39.3 29.0* 31.9*  MCV 90.8 91.4  --  88.1  PLT 418* 351  --  260    Basic Metabolic Panel: Recent Labs  Lab 09/13/24 1756 09/14/24 0309 09/14/24 0544 09/14/24 1913 09/14/24 1920 09/15/24 0502  NA 137 139  --  139 138 135  K 3.8 3.4*  --  4.2 4.0 4.9  CL 101 106  --  106  --  101  CO2 20* 16*  --  18*  --  19*  GLUCOSE 104* 100*  --  155*  --  147*  BUN 29* 27*  --  29*  --  33*  CREATININE 0.84 0.89  --  1.51*  --  1.77*  CALCIUM  8.7* 8.2*  --  7.3*  --  7.3*  MG 2.1  --  1.8   --   --  2.3  PHOS  --  2.3*  --  3.2  --   --    GFR: Estimated Creatinine Clearance: 23.2 mL/min (A) (by C-G formula based on SCr of 1.77 mg/dL (H)). Recent Labs  Lab 09/13/24 1756 09/14/24 0309 09/14/24 0544 09/14/24 1913 09/15/24 0502  WBC 11.1* 3.6*  --   --  28.9*  LATICACIDVEN 1.5 3.1* 6.1* 3.8* 4.4*    Liver Function Tests: Recent Labs  Lab 09/13/24 1756 09/14/24 0309 09/14/24 1913 09/15/24 0502  AST 812* 3,654*  --  1,409*  ALT 210* 1,217*  --  779*  ALKPHOS 422* 755*  --  412*  BILITOT 1.0 1.9*  --  1.5*  PROT 6.9 6.1*  --  5.3*  ALBUMIN  3.3* 2.9* 2.4* 2.6*   Recent Labs  Lab 09/13/24 1756 09/14/24 0544  LIPASE 13 <10*   No results for input(s): AMMONIA in the last 168 hours.  ABG    Component Value Date/Time   PHART 7.410 09/14/2024 1920   PCO2ART 23.3 (L) 09/14/2024 1920   PO2ART 390 (H) 09/14/2024 1920   HCO3 14.8 (L) 09/14/2024 1920   TCO2 15 (L) 09/14/2024 1920   ACIDBASEDEF 9.0 (H) 09/14/2024 1920   O2SAT 100 09/14/2024 1920     Coagulation Profile: Recent Labs  Lab 09/14/24 0805 09/15/24 0502  INR 1.5* 1.5*    Cardiac Enzymes: No results for input(s): CKTOTAL, CKMB, CKMBINDEX, TROPONINI in the last 168 hours.  HbA1C: Hgb A1c MFr Bld  Date/Time Value Ref Range Status  01/03/2009 05:33 AM  4.6 - 6.1 % Final   5.4 (NOTE) The ADA recommends the following therapeutic goal for glycemic control related to Hgb A1c measurement: Goal of therapy: <6.5 Hgb A1c  Reference: American Diabetes Association: Clinical Practice Recommendations 2010, Diabetes Care, 2010, 33: (Suppl  1).    CBG: Recent Labs  Lab 09/15/24 0730 09/15/24 1112 09/15/24 1513 09/15/24 1946 09/16/24 0318  GLUCAP 99 109* 106* 83 93    Review of Systems:    Past Medical History:  He,  has a past medical history of Hypercholesteremia, Thyroid disease, and Urinary retention.   Surgical History:   Past Surgical History:  Procedure Laterality Date    BACK SURGERY     sciatic nerve    COLONOSCOPY  10/2009   Dr. Harvey: frequent diverticula, pathology with lymphocytic colitis. Large internal hemorrhoids.    FLEXIBLE SIGMOIDOSCOPY N/A 11/12/2016   Procedure: FLEXIBLE SIGMOIDOSCOPY;  Surgeon: Lamar CHRISTELLA Hollingshead, MD;  Location: [facility];  Service: Endoscopy;  Laterality: N/A;   IR PERC CHOLECYSTOSTOMY  09/14/2024  TRANSURETHRAL RESECTION OF BLADDER TUMOR Bilateral 10/31/2014   Procedure: CYSTO, BLADDER BIOPSY/CLOT EVACUATION, , BILATERAL RETROGRADE PYELOGRAM,BILATERAL URETERAL STENT PLACEMENT;  Surgeon: Ricardo Likens, MD;  Location: [facility];  Service: Urology;  Laterality: Bilateral;     Social History:   reports that he has never smoked. He has never used smokeless tobacco. He reports that he does not drink alcohol  and does not use drugs.   Family History:  His family history is negative for Colon cancer.   Allergies Allergies[1]   Home Medications  Prior to Admission medications  Medication Sig Start Date End Date Taking? Authorizing Provider  tamsulosin  (FLOMAX ) 0.4 MG CAPS capsule TAKE 1 CAPSULE (0.4 MG TOTAL) BY MOUTH IN THE MORNING . 08/28/22   Watt Rush, MD           CRITICAL CARE time 46 minutes   Deward Eastern, AGACNP-BC [facility]  See Amion for personal pager PCCM on call pager [phone] until 7pm. Please call Elink 7p-7a. [phone]  09/16/2024 12:21 PM          [1] No Known Allergies  "

## 2024-09-17 ENCOUNTER — Encounter (HOSPITAL_COMMUNITY): Payer: Self-pay | Admitting: Gastroenterology

## 2024-09-17 DIAGNOSIS — A4151 Sepsis due to Escherichia coli [E. coli]: Secondary | ICD-10-CM | POA: Diagnosis not present

## 2024-09-17 DIAGNOSIS — K8040 Calculus of bile duct with cholecystitis, unspecified, without obstruction: Secondary | ICD-10-CM | POA: Diagnosis not present

## 2024-09-17 DIAGNOSIS — I480 Paroxysmal atrial fibrillation: Secondary | ICD-10-CM | POA: Diagnosis not present

## 2024-09-17 DIAGNOSIS — K8030 Calculus of bile duct with cholangitis, unspecified, without obstruction: Secondary | ICD-10-CM

## 2024-09-17 DIAGNOSIS — G9341 Metabolic encephalopathy: Secondary | ICD-10-CM | POA: Diagnosis not present

## 2024-09-17 DIAGNOSIS — E039 Hypothyroidism, unspecified: Secondary | ICD-10-CM | POA: Diagnosis not present

## 2024-09-17 DIAGNOSIS — N179 Acute kidney failure, unspecified: Secondary | ICD-10-CM | POA: Diagnosis not present

## 2024-09-17 LAB — CBC WITH DIFFERENTIAL/PLATELET
Abs Immature Granulocytes: 0.13 10*3/uL — ABNORMAL HIGH (ref 0.00–0.07)
Basophils Absolute: 0.1 10*3/uL (ref 0.0–0.1)
Basophils Relative: 1 %
Eosinophils Absolute: 0 10*3/uL (ref 0.0–0.5)
Eosinophils Relative: 0 %
HCT: 24.9 % — ABNORMAL LOW (ref 39.0–52.0)
Hemoglobin: 8.5 g/dL — ABNORMAL LOW (ref 13.0–17.0)
Immature Granulocytes: 1 %
Lymphocytes Relative: 8 %
Lymphs Abs: 0.9 10*3/uL (ref 0.7–4.0)
MCH: 29.5 pg (ref 26.0–34.0)
MCHC: 34.1 g/dL (ref 30.0–36.0)
MCV: 86.5 fL (ref 80.0–100.0)
Monocytes Absolute: 0.2 10*3/uL (ref 0.1–1.0)
Monocytes Relative: 2 %
Neutro Abs: 9.6 10*3/uL — ABNORMAL HIGH (ref 1.7–7.7)
Neutrophils Relative %: 88 %
Platelets: 85 10*3/uL — ABNORMAL LOW (ref 150–400)
RBC: 2.88 MIL/uL — ABNORMAL LOW (ref 4.22–5.81)
RDW: 14.6 % (ref 11.5–15.5)
Smear Review: NORMAL
WBC: 10.9 10*3/uL — ABNORMAL HIGH (ref 4.0–10.5)
nRBC: 0.2 % (ref 0.0–0.2)

## 2024-09-17 LAB — RENAL FUNCTION PANEL
Albumin: 2.6 g/dL — ABNORMAL LOW (ref 3.5–5.0)
Anion gap: 10 (ref 5–15)
BUN: 48 mg/dL — ABNORMAL HIGH (ref 8–23)
CO2: 27 mmol/L (ref 22–32)
Calcium: 7 mg/dL — ABNORMAL LOW (ref 8.9–10.3)
Chloride: 98 mmol/L (ref 98–111)
Creatinine, Ser: 3.04 mg/dL — ABNORMAL HIGH (ref 0.61–1.24)
GFR, Estimated: 19 mL/min — ABNORMAL LOW
Glucose, Bld: 88 mg/dL (ref 70–99)
Phosphorus: 2.9 mg/dL (ref 2.5–4.6)
Potassium: 4.4 mmol/L (ref 3.5–5.1)
Sodium: 134 mmol/L — ABNORMAL LOW (ref 135–145)

## 2024-09-17 LAB — AEROBIC/ANAEROBIC CULTURE W GRAM STAIN (SURGICAL/DEEP WOUND)

## 2024-09-17 LAB — MAGNESIUM: Magnesium: 1.9 mg/dL (ref 1.7–2.4)

## 2024-09-17 MED ORDER — LACTATED RINGERS IV SOLN: INTRAVENOUS | Status: AC

## 2024-09-17 MED ORDER — LOPERAMIDE HCL 2 MG PO CAPS
2.0000 mg | ORAL_CAPSULE | ORAL | Status: DC | PRN
  Administered 2024-09-17 – 2024-09-20 (×5): 2 mg via ORAL
  Filled 2024-09-17 (×5): qty 1

## 2024-09-17 NOTE — Progress Notes (Signed)
 "  NAME:  Benjamin [name], MRN:  [id number], DOB:  1933/02/22, LOS: 4 ADMISSION DATE:  09/13/2024, CONSULTATION DATE:  09/14/24 REFERRING MD:  Dr. Ricky, CHIEF COMPLAINT:  septic shock   History of Present Illness:   44 yoM with PMH of BPH with urinary retention, HLD, thyroid disease, PUD/ GERD, Cdiff (2009), and duodenitis who presented from home to [facility] on 1/27 with 2 week history of body aches, weakness, subjective fever, decreased oral intake and onset of abd pain with vomiting after eating.  Found to have elevated LFTs, lactic, with blood cultures positive for e. Coli bacteremia.  CT a/p showing acute cholelithiasis and choledocholithiasis with intrahepatic and extrahepatic biliary ductal dilation.  Pt with worsening septic shock requiring peripheral NE.  CCS and IR consulted, pt to be transferred to [facility] for percutaneous cholecystostomy drain given hemodynamic instability, not a surgical candidate currently.  PCCM to admit.   Pertinent  Medical History  BPH with urinary retention, HLD, thyroid disease, PUD/ GERD, Cdiff (2009), duodenitis   Significant Hospital Events: Including procedures, antibiotic start and stop dates in addition to other pertinent events   1/27 admit [facility] 1/28 tx to [facility], chole tube 1/29 ERCP unable to cannulate biliary tree. Off pressors. Biliary drain place by IR.  1/30 minimal UOP, increasing Cr, nephrology consulted  Interim History / Subjective:   Patient feeling well today Off levophed    Objective    Blood pressure 113/69, pulse 97, temperature 97.9 F (36.6 C), resp. rate 18, height 5' 7 (1.702 m), weight 57.9 kg, SpO2 90%.        Intake/Output Summary (Last 24 hours) at 09/17/2024 0725 Last data filed at 09/17/2024 0500 Gross per 24 hour  Intake 3229.28 ml  Output 1620 ml  Net 1609.28 ml   Filed Weights   09/14/24 0250 09/14/24 1641 09/15/24 1134  Weight: 52.9 kg 57.9 kg 57.9 kg    Examination:  General: thin elderly appearing male in  NAD HEENT: [place]/AT, PERRL Chest: Clear bilateral breath sounds Abdomen: two right sided abdominal drains with dark bilious drainage.  Extremities: no edema Neuro: somnolent, but will arouse to participate in exam  Resolved problem list  Septic shock  Assessment and Plan   Sepsis due to E. coli bacteremia Cholecystitis, cholangitis/p PERC Chole on 1/28, biliary drain 1/29. ERCP attempted, but unable.  - Continue ceftriaxone  - drains per IR for source control - Appreciate surgery and GI  Acute metabolic encephalopathy  - related to sepsis, bacteremia.  - improving  AKI with decreased urinary output - UOP remains poor - nephrology consulted, will consider starting CRRT when indicated. No urgent indication at this time  Hypothyroidism -subtherapeutic TSH 15, T4 1.07 - was supposed to be on treatment at home, but hasn't been taking it.  - monitor  PAF with RVR - now rate controlled, CHAD2S@-VASC 2 - Tele - holding AC for now pending instrumentation.   Keep in ICU may start CRRT tomorrow   Labs   CBC: Recent Labs  Lab 09/13/24 1756 09/14/24 0309 09/14/24 1920 09/15/24 0502 09/16/24 0658  WBC 11.1* 3.6*  --  28.9* 21.8*  NEUTROABS 9.0*  --   --   --   --   HGB 12.0* 12.3* 9.9* 10.6* 10.3*  HCT 37.3* 39.3 29.0* 31.9* 30.1*  MCV 90.8 91.4  --  88.1 85.8  PLT 418* 351  --  260 157    Basic Metabolic Panel: Recent Labs  Lab 09/13/24 1756 09/14/24 0309 09/14/24 0544 09/14/24 1913  09/14/24 1920 09/15/24 0502 09/16/24 0658  NA 137 139  --  139 138 135 136  K 3.8 3.4*  --  4.2 4.0 4.9 4.5  CL 101 106  --  106  --  101 97*  CO2 20* 16*  --  18*  --  19* 27  GLUCOSE 104* 100*  --  155*  --  147* 97  BUN 29* 27*  --  29*  --  33* 42*  CREATININE 0.84 0.89  --  1.51*  --  1.77* 2.45*  CALCIUM  8.7* 8.2*  --  7.3*  --  7.3* 6.9*  MG 2.1  --  1.8  --   --  2.3  --   PHOS  --  2.3*  --  3.2  --   --   --    GFR: Estimated Creatinine Clearance: 16.7 mL/min (A) (by  C-G formula based on SCr of 2.45 mg/dL (H)). Recent Labs  Lab 09/13/24 1756 09/14/24 0309 09/14/24 0544 09/14/24 1913 09/15/24 0502 09/16/24 0658 09/16/24 1020 09/16/24 1320  WBC 11.1* 3.6*  --   --  28.9* 21.8*  --   --   LATICACIDVEN 1.5 3.1*   < > 3.8* 4.4*  --  2.7* 2.9*   < > = values in this interval not displayed.    Liver Function Tests: Recent Labs  Lab 09/13/24 1756 09/14/24 0309 09/14/24 1913 09/15/24 0502 09/16/24 0658  AST 812* 3,654*  --  1,409* 906*  ALT 210* 1,217*  --  779* 491*  ALKPHOS 422* 755*  --  412* 249*  BILITOT 1.0 1.9*  --  1.5* 0.9  PROT 6.9 6.1*  --  5.3* 4.5*  ALBUMIN  3.3* 2.9* 2.4* 2.6* 1.9*   Recent Labs  Lab 09/13/24 1756 09/14/24 0544  LIPASE 13 <10*   No results for input(s): AMMONIA in the last 168 hours.  ABG    Component Value Date/Time   PHART 7.410 09/14/2024 1920   PCO2ART 23.3 (L) 09/14/2024 1920   PO2ART 390 (H) 09/14/2024 1920   HCO3 14.8 (L) 09/14/2024 1920   TCO2 15 (L) 09/14/2024 1920   ACIDBASEDEF 9.0 (H) 09/14/2024 1920   O2SAT 100 09/14/2024 1920     Coagulation Profile: Recent Labs  Lab 09/14/24 0805 09/15/24 0502  INR 1.5* 1.5*    Cardiac Enzymes: No results for input(s): CKTOTAL, CKMB, CKMBINDEX, TROPONINI in the last 168 hours.  HbA1C: Hgb A1c MFr Bld  Date/Time Value Ref Range Status  01/03/2009 05:33 AM  4.6 - 6.1 % Final   5.4 (NOTE) The ADA recommends the following therapeutic goal for glycemic control related to Hgb A1c measurement: Goal of therapy: <6.5 Hgb A1c  Reference: American Diabetes Association: Clinical Practice Recommendations 2010, Diabetes Care, 2010, 33: (Suppl  1).    CBG: Recent Labs  Lab 09/15/24 0730 09/15/24 1112 09/15/24 1513 09/15/24 1946 09/16/24 0318  GLUCAP 99 109* 106* 83 93             CRITICAL CARE Performed by: Dorn KATHEE Chill   Total critical care time: 32 minutes  Critical care time was exclusive of separately billable  procedures and treating other patients.  Critical care was necessary to treat or prevent imminent or life-threatening deterioration.  Critical care was time spent personally by me on the following activities: development of treatment plan with patient and/or surrogate as well as nursing, discussions with consultants, evaluation of patient's response to treatment, examination of patient, obtaining history from patient or  surrogate, ordering and performing treatments and interventions, ordering and review of laboratory studies, ordering and review of radiographic studies, pulse oximetry, re-evaluation of patient's condition and participation in multidisciplinary rounds.  Dorn Chill, MD [facility] Office: [phone]   See Amion for personal pager PCCM on call pager [phone] until 7pm. Please call Elink 7p-7a. [phone]        "

## 2024-09-17 NOTE — Plan of Care (Signed)
  Problem: Clinical Measurements: Goal: Ability to maintain clinical measurements within normal limits will improve Outcome: Progressing Goal: Respiratory complications will improve Outcome: Progressing   Problem: Nutrition: Goal: Adequate nutrition will be maintained Outcome: Progressing

## 2024-09-17 NOTE — Progress Notes (Signed)
 [facility] NEPHROLOGY PROGRESS NOTE  Assessment/ Plan: Pt is a 89 y.o. yo male  with past medical history significant for dyslipidemia, thyroid disease, acid reflux, BPH with urinary retention who was initially presented to [facility] on 1/27 with generalized body pain, weakness, fever, abdominal pain, decreased oral intake, seen as a consultation for AKI.  # Acute kidney injury, anuric likely ischemic ATN in the setting of septic shock, E. coli bacteremia/contrast. CT scan ruled out hydronephrosis.  Sepsis labs are gradually improving with fluid, pressor and antibiotics.  Noted reduced urine output and creatinine level is trending up, however no acute indication for dialysis at this time. Volume status acceptable.  I like to continue supportive care, IV fluid today.  Continue strict ins and out and daily lab.  Patient is amenable for a short trial of dialysis if needed, hopefully we can avoid it. -Please avoid hypotensive episode, IV contrast or nephrotoxins.   # Septic shock due to E. coli bacteremia, off of pressor.  Antibiotics per primary team.  Sepsis physiology is improving.   # Cholangitis status post percutaneous cholecystostomy with biliary drain.  ERCP attempted but unsuccessful. Transaminitis improving.   # Acute metabolic encephalopathy due to sepsis.  Discussed with ICU provider and nursing staff.  Subjective: Seen and examined.  Urine output is documented only 55 cc.  Denies nausea, vomiting, chest pain or shortness of breath.  He is alert awake.  Off of pressors.  No other new event. Objective Vital signs in last 24 hours: Vitals:   09/17/24 0415 09/17/24 0500 09/17/24 0503 09/17/24 0600  BP:   106/71 113/69  Pulse: 95 95 89 97  Resp: 20 (!) 25 19 18   Temp: 98.1 F (36.7 C) 97.9 F (36.6 C) 97.9 F (36.6 C) 97.9 F (36.6 C)  TempSrc:      SpO2: 97% 97% 94% 90%  Weight:      Height:       Weight change:   Intake/Output Summary (Last 24 hours) at 09/17/2024  1047 Last data filed at 09/17/2024 0800 Gross per 24 hour  Intake 2624.07 ml  Output 1720 ml  Net 904.07 ml       Labs: RENAL PANEL Recent Labs  Lab 09/13/24 1756 09/14/24 0309 09/14/24 0544 09/14/24 1913 09/14/24 1920 09/15/24 0502 09/16/24 0658 09/17/24 0820  NA 137 139  --  139 138 135 136 134*  K 3.8 3.4*  --  4.2 4.0 4.9 4.5 4.4  CL 101 106  --  106  --  101 97* 98  CO2 20* 16*  --  18*  --  19* 27 27  GLUCOSE 104* 100*  --  155*  --  147* 97 88  BUN 29* 27*  --  29*  --  33* 42* 48*  CREATININE 0.84 0.89  --  1.51*  --  1.77* 2.45* 3.04*  CALCIUM  8.7* 8.2*  --  7.3*  --  7.3* 6.9* 7.0*  MG 2.1  --  1.8  --   --  2.3  --   --   PHOS  --  2.3*  --  3.2  --   --   --  2.9  ALBUMIN  3.3* 2.9*  --  2.4*  --  2.6* 1.9* 2.6*    Liver Function Tests: Recent Labs  Lab 09/14/24 0309 09/14/24 1913 09/15/24 0502 09/16/24 0658 09/17/24 0820  AST 3,654*  --  1,409* 906*  --   ALT 1,217*  --  779* 491*  --  ALKPHOS 755*  --  412* 249*  --   BILITOT 1.9*  --  1.5* 0.9  --   PROT 6.1*  --  5.3* 4.5*  --   ALBUMIN  2.9*   < > 2.6* 1.9* 2.6*   < > = values in this interval not displayed.   Recent Labs  Lab 09/13/24 1756 09/14/24 0544  LIPASE 13 <10*   No results for input(s): AMMONIA in the last 168 hours. CBC: Recent Labs    09/14/24 0309 09/14/24 1920 09/15/24 0502 09/16/24 0658 09/17/24 0856  HGB 12.3* 9.9* 10.6* 10.3* 8.5*  MCV 91.4  --  88.1 85.8 86.5    Cardiac Enzymes: No results for input(s): CKTOTAL, CKMB, CKMBINDEX, TROPONINI in the last 168 hours. CBG: Recent Labs  Lab 09/15/24 0730 09/15/24 1112 09/15/24 1513 09/15/24 1946 09/16/24 0318  GLUCAP 99 109* 106* 83 93    Iron Studies: No results for input(s): IRON, TIBC, TRANSFERRIN, FERRITIN in the last 72 hours. Studies/Results: ECHOCARDIOGRAM COMPLETE Result Date: 09/16/2024    ECHOCARDIOGRAM REPORT   Patient Name:   Benjamin [name] Date of Exam: 09/16/2024 Medical Rec  #:  [id number]        Height:       67.0 in Accession #:    [id number]       Weight:       127.6 lb Date of Birth:  [birth year]        BSA:          1.671 m Patient Age:    89 years         BP:           103/92 mmHg Patient Gender: M                HR:           88 bpm. Exam Location:  Inpatient Procedure: 2D Echo, Cardiac Doppler, Color Doppler and Intracardiac            Opacification Agent (Both Spectral and Color Flow Doppler were            utilized during procedure). Indications:    Shock  History:        Patient has no prior history of Echocardiogram examinations.  Sonographer:    Philomena Daring Referring Phys: [id number] DEWARD ORN HOFFMAN  Sonographer Comments: Image acquisition challenging due to patient body habitus. IMPRESSIONS  1. Left ventricular ejection fraction, by estimation, is 45%. The left ventricle has mildly decreased function. The left ventricle demonstrates global hypokinesis. There is mild concentric left ventricular hypertrophy. Indeterminate diastolic filling due to E-A fusion.  2. Right ventricular systolic function is normal. The right ventricular size is normal. There is normal pulmonary artery systolic pressure.  3. There is no evidence of pericardial effusion.  4. The mitral valve is normal in structure. Trivial mitral valve regurgitation. No evidence of mitral stenosis.  5. The aortic valve is calcified. Aortic valve regurgitation is mild. No aortic stenosis is present.  6. The inferior vena cava is normal in size with greater than 50% respiratory variability, suggesting right atrial pressure of 3 mmHg. Comparison(s): No prior Echocardiogram. Conclusion(s)/Recommendation(s): Mildly reduced LVEF. FINDINGS  Left Ventricle: Left ventricular ejection fraction, by estimation, is 45%. The left ventricle has mildly decreased function. The left ventricle demonstrates global hypokinesis. Definity  contrast agent was given IV to delineate the left ventricular endocardial borders. The left ventricular  internal cavity size was normal in size. There is mild concentric left  ventricular hypertrophy. Indeterminate diastolic filling due to E-A fusion. Right Ventricle: The right ventricular size is normal. No increase in right ventricular wall thickness. Right ventricular systolic function is normal. There is normal pulmonary artery systolic pressure. The tricuspid regurgitant velocity is 2.71 m/s, and  with an assumed right atrial pressure of 3 mmHg, the estimated right ventricular systolic pressure is 32.4 mmHg. Left Atrium: Left atrial size was normal in size. Right Atrium: Right atrial size was normal in size. Pericardium: There is no evidence of pericardial effusion. Mitral Valve: The mitral valve is normal in structure. Trivial mitral valve regurgitation. No evidence of mitral valve stenosis. Tricuspid Valve: The tricuspid valve is normal in structure. Tricuspid valve regurgitation is trivial. No evidence of tricuspid stenosis. Aortic Valve: The aortic valve is calcified. Aortic valve regurgitation is mild. No aortic stenosis is present. Pulmonic Valve: The pulmonic valve was normal in structure. Pulmonic valve regurgitation is not visualized. No evidence of pulmonic stenosis. Aorta: The aortic root and ascending aorta are structurally normal, with no evidence of dilitation. Venous: The inferior vena cava is normal in size with greater than 50% respiratory variability, suggesting right atrial pressure of 3 mmHg. IAS/Shunts: No atrial level shunt detected by color flow Doppler.  LEFT VENTRICLE PLAX 2D LVIDd:         4.20 cm   Diastology LVIDs:         3.30 cm   LV e' medial:  5.44 cm/s LV PW:         1.10 cm   LV e' lateral: 5.44 cm/s LV IVS:        1.30 cm LVOT diam:     2.39 cm LV SV:         60 LV SV Index:   36 LVOT Area:     4.49 cm  RIGHT VENTRICLE             IVC RV S prime:     13.10 cm/s  IVC diam: 1.75 cm TAPSE (M-mode): 2.0 cm LEFT ATRIUM             Index        RIGHT ATRIUM           Index LA diam:         2.35 cm 1.41 cm/m   RA Area:     15.30 cm LA Vol (A2C):   39.5 ml 23.64 ml/m  RA Volume:   39.70 ml  23.76 ml/m LA Vol (A4C):   40.9 ml 24.48 ml/m LA Biplane Vol: 40.2 ml 24.06 ml/m  AORTIC VALVE LVOT Vmax:   77.00 cm/s LVOT Vmean:  49.800 cm/s LVOT VTI:    0.134 m  AORTA Ao Root diam: 3.49 cm Ao Asc diam:  3.58 cm TRICUSPID VALVE TR Peak grad:   29.4 mmHg TR Vmax:        271.00 cm/s  SHUNTS Systemic VTI:  0.13 m Systemic Diam: 2.39 cm Georganna Archer Electronically signed by Georganna Archer Signature Date/Time: 09/16/2024/6:06:13 PM    Final    IR BILIARY DRAIN PLACEMENT WITH CHOLANGIOGRAM Result Date: 09/16/2024 INDICATION: Failed ERCP a patient with ascending cholangitis, elevated liver enzymes, bile duct stones. The ERCP failed secondary to the ampulla being in the duodenal diverticulum EXAM: Transhepatic percutaneous cholangiogram with internal/external biliary drain placement MEDICATIONS: 50 mL Omnipaque  300 ANESTHESIA/SEDATION: Moderate (conscious) sedation was employed during this procedure. A total of Versed  1 mg and Fentanyl  50 mcg was administered intravenously by the radiology nurse. Total  intra-service moderate Sedation Time: 55 minutes. The patient's level of consciousness and vital signs were monitored continuously by radiology nursing throughout the procedure under my direct supervision. FLUOROSCOPY: Radiation Exposure Index (as provided by the fluoroscopic device): 280 mGy Kerma COMPLICATIONS: None immediate. PROCEDURE: Informed written consent was obtained from the patient after a thorough discussion of the procedural risks, benefits and alternatives. All questions were addressed. Maximal Sterile Barrier Technique was utilized including caps, mask, sterile gowns, sterile gloves, sterile drape, hand hygiene and skin antiseptic. A timeout was performed prior to the initiation of the procedure. With the patient in a right anterior oblique position on the IR table, the right upper quadrant  was evaluated with ultrasound. Patient's skin was then marked, prepped, and draped in usual sterile fashion. Local anesthesia was achieved with 1% lidocaine . Using a combination of ultrasound and fluoroscopy, percutaneous access was achieved by advancing the needle from the skin through the liver into a intraparenchymal bile duct. Intraductal positioning was verified by injecting contrast under fluoroscopy visualizing contrast flowing in the right lobe hepatic ducts. Access purchase of the needle was suboptimal for catheter placement. This access was used to inject contrast and further achieve better access in a more inferior position within the right upper quadrant. A second needle was then advanced using combination of ultrasound and fluoroscopy until intraductal position was again verified. After the second needle was positioned within the hepatic ducts and a guidewire was advanced to the common bile duct was the initial access retrieved. An 018 guidewire was then advanced into the common bile duct. Contrast can then be seen flowing from the common bile duct into the duodenal diverticulum. Navigating the guidewire into the ampulla, duodenal diverticulum, and then into the duodenal proper was difficult, however this was finally achieved. Access was then exchanged for a stiff Glidewire and Berenstein catheter. Access was then exchanged for an Amplatz guidewire. The Amplatz guidewire was then advanced further into the duodenal and used to reduce the position out of the duodenal diverticulum. Once the guidewire was in a more optimal profile the access site was dilated using an 8 French and then 9 French dilator. A 10 French biliary drain was then advanced over the guidewire and into the duodenal. Contrast was again injected demonstrating satisfactory position and flow of contrast into the duodenal as well as the common bile duct. Locking mechanism engaged. Retention suture and sterile dressing applied. Catheter was  connected to gravity drainage. IMPRESSION: Satisfactory placement of a 10 French internal/external transhepatic biliary drain as described above. Electronically Signed   By: Cordella Banner   On: 09/16/2024 09:58   DG C-Arm 1-60 Min Result Date: 09/15/2024 EXAM: FLUOROSCOPIC IMAGING TECHNIQUE: Fluoroscopy was provided by the radiology department for procedure. Radiologist was not present during examination. RADIATION DOSE INDEX: Reference Air Kerma: 3.69 mGy Fluoroscopy time: 27 seconds. Fluoroscopy images: 7. COMPARISON: None available. CLINICAL HISTORY: acute cholecystitis and ERCP. FINDINGS: Intraoperative fluoroscopic imaging was performed. Several scout films were obtained during an ERCP. By history, the ERCP was unsuccessful. No contrast is visualized. IMPRESSION: 1. Intraoperative fluoroscopic imaging as above. Please refer to the operative report for full details. 2. Unsuccessful ERCP. Electronically signed by: Oneil Devonshire MD 09/15/2024 09:13 PM EST RP Workstation: [id number]   DG CHEST PORT 1 VIEW Result Date: 09/15/2024 CLINICAL DATA:  Hypoxemia. EXAM: PORTABLE CHEST 1 VIEW COMPARISON:  09/14/2024 FINDINGS: An endotracheal tube is 4.1 cm above the carina and appropriately positioned. Left jugular central line with the tip at the superior  cavoatrial junction. Negative for a pneumothorax. Slightly increased densities at the medial left lung base with air bronchograms. Mild blunting at the costophrenic angles. Heart size is upper limits of normal but stable. Atherosclerotic calcifications at the aortic arch. IMPRESSION: 1. Slightly increased densities at the medial left lung base. Findings could represent atelectasis or consolidation. 2. Support apparatuses as described. Negative for pneumothorax. Electronically Signed   By: Juliene Balder M.D.   On: 09/15/2024 14:39    Medications: Infusions:  cefTRIAXone  (ROCEPHIN )  IV Stopped (09/16/24 1316)   metronidazole  500 mg (09/17/24 0925)   norepinephrine   (LEVOPHED ) Adult infusion Stopped (09/16/24 1226)    Scheduled Medications:  Chlorhexidine  Gluconate Cloth  6 each Topical Daily   feeding supplement  1 Container Oral TID BM   heparin  injection (subcutaneous)  5,000 Units Subcutaneous Q8H   melatonin  3 mg Oral QHS   pantoprazole  (PROTONIX ) IV  40 mg Intravenous Q24H   sodium chloride  flush  5 mL Intracatheter Q8H   sodium chloride  flush  5 mL Intracatheter Q8H   traZODone   50 mg Oral QHS    have reviewed scheduled and prn medications.  Physical Exam: General:NAD, comfortable Heart:RRR, s1s2 nl Lungs:clear b/l, no crackle Abdomen:soft, Non-tender, non-distended Extremities:No edema Neurology: Alert and awake.  Jaison Petraglia Prasad Nathanie Ottley 09/17/2024,10:47 AM  LOS: 4 days

## 2024-09-17 NOTE — Progress Notes (Signed)
 09/17/2024 Imodium  loose stools.  Elink

## 2024-09-18 DIAGNOSIS — N179 Acute kidney failure, unspecified: Secondary | ICD-10-CM | POA: Diagnosis not present

## 2024-09-18 DIAGNOSIS — K8040 Calculus of bile duct with cholecystitis, unspecified, without obstruction: Secondary | ICD-10-CM | POA: Diagnosis not present

## 2024-09-18 DIAGNOSIS — A4151 Sepsis due to Escherichia coli [E. coli]: Secondary | ICD-10-CM | POA: Diagnosis not present

## 2024-09-18 DIAGNOSIS — E039 Hypothyroidism, unspecified: Secondary | ICD-10-CM | POA: Diagnosis not present

## 2024-09-18 DIAGNOSIS — I480 Paroxysmal atrial fibrillation: Secondary | ICD-10-CM | POA: Diagnosis not present

## 2024-09-18 DIAGNOSIS — K8030 Calculus of bile duct with cholangitis, unspecified, without obstruction: Secondary | ICD-10-CM | POA: Diagnosis not present

## 2024-09-18 LAB — CBC WITH DIFFERENTIAL/PLATELET
Abs Immature Granulocytes: 0.19 10*3/uL — ABNORMAL HIGH (ref 0.00–0.07)
Basophils Absolute: 0 10*3/uL (ref 0.0–0.1)
Basophils Relative: 0 %
Eosinophils Absolute: 0.1 10*3/uL (ref 0.0–0.5)
Eosinophils Relative: 1 %
HCT: 27.5 % — ABNORMAL LOW (ref 39.0–52.0)
Hemoglobin: 9.4 g/dL — ABNORMAL LOW (ref 13.0–17.0)
Immature Granulocytes: 2 %
Lymphocytes Relative: 10 %
Lymphs Abs: 0.9 10*3/uL (ref 0.7–4.0)
MCH: 29.4 pg (ref 26.0–34.0)
MCHC: 34.2 g/dL (ref 30.0–36.0)
MCV: 85.9 fL (ref 80.0–100.0)
Monocytes Absolute: 0.5 10*3/uL (ref 0.1–1.0)
Monocytes Relative: 5 %
Neutro Abs: 7.7 10*3/uL (ref 1.7–7.7)
Neutrophils Relative %: 82 %
Platelets: 75 10*3/uL — ABNORMAL LOW (ref 150–400)
RBC: 3.2 MIL/uL — ABNORMAL LOW (ref 4.22–5.81)
RDW: 14.5 % (ref 11.5–15.5)
Smear Review: NORMAL
WBC: 9.4 10*3/uL (ref 4.0–10.5)
nRBC: 0 % (ref 0.0–0.2)

## 2024-09-18 LAB — RENAL FUNCTION PANEL
Albumin: 2.2 g/dL — ABNORMAL LOW (ref 3.5–5.0)
Anion gap: 11 (ref 5–15)
BUN: 56 mg/dL — ABNORMAL HIGH (ref 8–23)
CO2: 26 mmol/L (ref 22–32)
Calcium: 7.1 mg/dL — ABNORMAL LOW (ref 8.9–10.3)
Chloride: 97 mmol/L — ABNORMAL LOW (ref 98–111)
Creatinine, Ser: 3.69 mg/dL — ABNORMAL HIGH (ref 0.61–1.24)
GFR, Estimated: 15 mL/min — ABNORMAL LOW
Glucose, Bld: 65 mg/dL — ABNORMAL LOW (ref 70–99)
Phosphorus: 3.8 mg/dL (ref 2.5–4.6)
Potassium: 4.7 mmol/L (ref 3.5–5.1)
Sodium: 135 mmol/L (ref 135–145)

## 2024-09-18 MED ORDER — SODIUM CHLORIDE 0.9 % IV SOLN: INTRAVENOUS | Status: AC

## 2024-09-18 MED ORDER — PANTOPRAZOLE SODIUM 40 MG PO TBEC
40.0000 mg | DELAYED_RELEASE_TABLET | Freq: Every day | ORAL | Status: AC
  Administered 2024-09-19 – 2024-09-23 (×5): 40 mg via ORAL
  Filled 2024-09-18 (×5): qty 1

## 2024-09-18 NOTE — Progress Notes (Signed)
 [facility] NEPHROLOGY PROGRESS NOTE  Assessment/ Plan: Pt is a 89 y.o. yo male  with past medical history significant for dyslipidemia, thyroid disease, acid reflux, BPH with urinary retention who was initially presented to [facility] on 1/27 with generalized body pain, weakness, fever, abdominal pain, decreased oral intake, seen as a consultation for AKI.  # Acute kidney injury, anuric likely ischemic ATN in the setting of septic shock, E. coli bacteremia/contrast. CT scan ruled out hydronephrosis.  Sepsis labs are gradually improving with fluid, pressor and antibiotics.  Noted reduced urine output and creatinine level is trending up, however no acute indication for dialysis at this time.  He does have increased drain output/diarrhea.  I will add gentle IV hydration as volume status is acceptable. Continue strict ins and out and daily lab.  Patient is amenable for a short trial of dialysis if needed, hopefully we can avoid it. -Please avoid hypotensive episode, IV contrast or nephrotoxins.   # Septic shock due to E. coli bacteremia, off of pressor.  Antibiotics per primary team.  Sepsis physiology is improving.   # Cholangitis status post percutaneous cholecystostomy with biliary drain.  ERCP attempted but unsuccessful. Transaminitis improving.   # Acute metabolic encephalopathy due to sepsis.  Discussed with ICU provider and nursing staff.  Subjective: Seen and examined.  Remains anuric.  No nausea, vomiting, chest pain or shortness of breath.  No new event.  Objective Vital signs in last 24 hours: Vitals:   09/18/24 0400 09/18/24 0500 09/18/24 0600 09/18/24 0700  BP: (!) 113/54 (!) 111/59 114/60 118/62  Pulse: 79 67 77 77  Resp: 20 (!) 21 20 20   Temp: (!) 97.3 F (36.3 C) (!) 97.3 F (36.3 C) (!) 97.3 F (36.3 C) (!) 97.3 F (36.3 C)  TempSrc: Bladder     SpO2: 91% 93% 91% 91%  Weight:      Height:       Weight change:   Intake/Output Summary (Last 24 hours) at  09/18/2024 1047 Last data filed at 09/18/2024 1021 Gross per 24 hour  Intake 1420.36 ml  Output 1625 ml  Net -204.64 ml       Labs: RENAL PANEL Recent Labs  Lab 09/13/24 1756 09/14/24 0309 09/14/24 0544 09/14/24 1913 09/14/24 1920 09/15/24 0502 09/16/24 0658 09/17/24 0820 09/17/24 0856 09/18/24 0717  NA 137 139  --  139 138 135 136 134*  --  135  K 3.8 3.4*  --  4.2 4.0 4.9 4.5 4.4  --  4.7  CL 101 106  --  106  --  101 97* 98  --  97*  CO2 20* 16*  --  18*  --  19* 27 27  --  26  GLUCOSE 104* 100*  --  155*  --  147* 97 88  --  65*  BUN 29* 27*  --  29*  --  33* 42* 48*  --  56*  CREATININE 0.84 0.89  --  1.51*  --  1.77* 2.45* 3.04*  --  3.69*  CALCIUM  8.7* 8.2*  --  7.3*  --  7.3* 6.9* 7.0*  --  7.1*  MG 2.1  --  1.8  --   --  2.3  --   --  1.9  --   PHOS  --  2.3*  --  3.2  --   --   --  2.9  --  3.8  ALBUMIN  3.3* 2.9*  --  2.4*  --  2.6* 1.9*  2.6*  --  2.2*    Liver Function Tests: Recent Labs  Lab 09/14/24 0309 09/14/24 1913 09/15/24 0502 09/16/24 0658 09/17/24 0820 09/18/24 0717  AST 3,654*  --  1,409* 906*  --   --   ALT 1,217*  --  779* 491*  --   --   ALKPHOS 755*  --  412* 249*  --   --   BILITOT 1.9*  --  1.5* 0.9  --   --   PROT 6.1*  --  5.3* 4.5*  --   --   ALBUMIN  2.9*   < > 2.6* 1.9* 2.6* 2.2*   < > = values in this interval not displayed.   Recent Labs  Lab 09/13/24 1756 09/14/24 0544  LIPASE 13 <10*   No results for input(s): AMMONIA in the last 168 hours. CBC: Recent Labs    09/14/24 1920 09/15/24 0502 09/16/24 0658 09/17/24 0856 09/18/24 0717  HGB 9.9* 10.6* 10.3* 8.5* 9.4*  MCV  --  88.1 85.8 86.5 85.9    Cardiac Enzymes: No results for input(s): CKTOTAL, CKMB, CKMBINDEX, TROPONINI in the last 168 hours. CBG: Recent Labs  Lab 09/15/24 0730 09/15/24 1112 09/15/24 1513 09/15/24 1946 09/16/24 0318  GLUCAP 99 109* 106* 83 93    Iron Studies: No results for input(s): IRON, TIBC, TRANSFERRIN, FERRITIN  in the last 72 hours. Studies/Results: ECHOCARDIOGRAM COMPLETE Result Date: 09/16/2024    ECHOCARDIOGRAM REPORT   Patient Name:   CASMER YEPIZ Date of Exam: 09/16/2024 Medical Rec #:  [id number]        Height:       67.0 in Accession #:    [id number]       Weight:       127.6 lb Date of Birth:  1933-04-26        BSA:          1.671 m Patient Age:    89 years         BP:           103/92 mmHg Patient Gender: M                HR:           88 bpm. Exam Location:  Inpatient Procedure: 2D Echo, Cardiac Doppler, Color Doppler and Intracardiac            Opacification Agent (Both Spectral and Color Flow Doppler were            utilized during procedure). Indications:    Shock  History:        Patient has no prior history of Echocardiogram examinations.  Sonographer:    Philomena Daring Referring Phys: [id number] DEWARD ORN HOFFMAN  Sonographer Comments: Image acquisition challenging due to patient body habitus. IMPRESSIONS  1. Left ventricular ejection fraction, by estimation, is 45%. The left ventricle has mildly decreased function. The left ventricle demonstrates global hypokinesis. There is mild concentric left ventricular hypertrophy. Indeterminate diastolic filling due to E-A fusion.  2. Right ventricular systolic function is normal. The right ventricular size is normal. There is normal pulmonary artery systolic pressure.  3. There is no evidence of pericardial effusion.  4. The mitral valve is normal in structure. Trivial mitral valve regurgitation. No evidence of mitral stenosis.  5. The aortic valve is calcified. Aortic valve regurgitation is mild. No aortic stenosis is present.  6. The inferior vena cava is normal in size with greater than 50% respiratory variability, suggesting right atrial  pressure of 3 mmHg. Comparison(s): No prior Echocardiogram. Conclusion(s)/Recommendation(s): Mildly reduced LVEF. FINDINGS  Left Ventricle: Left ventricular ejection fraction, by estimation, is 45%. The left ventricle has mildly  decreased function. The left ventricle demonstrates global hypokinesis. Definity  contrast agent was given IV to delineate the left ventricular endocardial borders. The left ventricular internal cavity size was normal in size. There is mild concentric left ventricular hypertrophy. Indeterminate diastolic filling due to E-A fusion. Right Ventricle: The right ventricular size is normal. No increase in right ventricular wall thickness. Right ventricular systolic function is normal. There is normal pulmonary artery systolic pressure. The tricuspid regurgitant velocity is 2.71 m/s, and  with an assumed right atrial pressure of 3 mmHg, the estimated right ventricular systolic pressure is 32.4 mmHg. Left Atrium: Left atrial size was normal in size. Right Atrium: Right atrial size was normal in size. Pericardium: There is no evidence of pericardial effusion. Mitral Valve: The mitral valve is normal in structure. Trivial mitral valve regurgitation. No evidence of mitral valve stenosis. Tricuspid Valve: The tricuspid valve is normal in structure. Tricuspid valve regurgitation is trivial. No evidence of tricuspid stenosis. Aortic Valve: The aortic valve is calcified. Aortic valve regurgitation is mild. No aortic stenosis is present. Pulmonic Valve: The pulmonic valve was normal in structure. Pulmonic valve regurgitation is not visualized. No evidence of pulmonic stenosis. Aorta: The aortic root and ascending aorta are structurally normal, with no evidence of dilitation. Venous: The inferior vena cava is normal in size with greater than 50% respiratory variability, suggesting right atrial pressure of 3 mmHg. IAS/Shunts: No atrial level shunt detected by color flow Doppler.  LEFT VENTRICLE PLAX 2D LVIDd:         4.20 cm   Diastology LVIDs:         3.30 cm   LV e' medial:  5.44 cm/s LV PW:         1.10 cm   LV e' lateral: 5.44 cm/s LV IVS:        1.30 cm LVOT diam:     2.39 cm LV SV:         60 LV SV Index:   36 LVOT Area:      4.49 cm  RIGHT VENTRICLE             IVC RV S prime:     13.10 cm/s  IVC diam: 1.75 cm TAPSE (M-mode): 2.0 cm LEFT ATRIUM             Index        RIGHT ATRIUM           Index LA diam:        2.35 cm 1.41 cm/m   RA Area:     15.30 cm LA Vol (A2C):   39.5 ml 23.64 ml/m  RA Volume:   39.70 ml  23.76 ml/m LA Vol (A4C):   40.9 ml 24.48 ml/m LA Biplane Vol: 40.2 ml 24.06 ml/m  AORTIC VALVE LVOT Vmax:   77.00 cm/s LVOT Vmean:  49.800 cm/s LVOT VTI:    0.134 m  AORTA Ao Root diam: 3.49 cm Ao Asc diam:  3.58 cm TRICUSPID VALVE TR Peak grad:   29.4 mmHg TR Vmax:        271.00 cm/s  SHUNTS Systemic VTI:  0.13 m Systemic Diam: 2.39 cm Georganna Archer Electronically signed by Georganna Archer Signature Date/Time: 09/16/2024/6:06:13 PM    Final     Medications: Infusions:  cefTRIAXone  (ROCEPHIN )  IV Stopped (09/17/24  1419)   metronidazole  500 mg (09/18/24 1021)    Scheduled Medications:  Chlorhexidine  Gluconate Cloth  6 each Topical Daily   feeding supplement  1 Container Oral TID BM   heparin  injection (subcutaneous)  5,000 Units Subcutaneous Q8H   melatonin  3 mg Oral QHS   pantoprazole  (PROTONIX ) IV  40 mg Intravenous Q24H   sodium chloride  flush  5 mL Intracatheter Q8H   sodium chloride  flush  5 mL Intracatheter Q8H   traZODone   50 mg Oral QHS    have reviewed scheduled and prn medications.  Physical Exam: General:NAD, comfortable Heart:RRR, s1s2 nl Lungs:clear b/l, no crackle Abdomen:soft, Non-tender, non-distended Extremities:No edema Neurology: Alert and awake.  Tyia Binford Prasad Parnell Spieler 09/18/2024,10:47 AM  LOS: 5 days

## 2024-09-18 NOTE — Plan of Care (Signed)
  Problem: Clinical Measurements: Goal: Will remain free from infection Outcome: Progressing Goal: Diagnostic test results will improve Outcome: Progressing Goal: Cardiovascular complication will be avoided Outcome: Progressing   

## 2024-09-19 LAB — RENAL FUNCTION PANEL
Albumin: 2.2 g/dL — ABNORMAL LOW (ref 3.5–5.0)
Anion gap: 18 — ABNORMAL HIGH (ref 5–15)
BUN: 63 mg/dL — ABNORMAL HIGH (ref 8–23)
CO2: 21 mmol/L — ABNORMAL LOW (ref 22–32)
Calcium: 7.1 mg/dL — ABNORMAL LOW (ref 8.9–10.3)
Chloride: 97 mmol/L — ABNORMAL LOW (ref 98–111)
Creatinine, Ser: 4.21 mg/dL — ABNORMAL HIGH (ref 0.61–1.24)
GFR, Estimated: 13 mL/min — ABNORMAL LOW
Glucose, Bld: 61 mg/dL — ABNORMAL LOW (ref 70–99)
Phosphorus: 5.2 mg/dL — ABNORMAL HIGH (ref 2.5–4.6)
Potassium: 5 mmol/L (ref 3.5–5.1)
Sodium: 135 mmol/L (ref 135–145)

## 2024-09-19 LAB — HEPATIC FUNCTION PANEL
ALT: 267 U/L — ABNORMAL HIGH (ref 0–44)
AST: 269 U/L — ABNORMAL HIGH (ref 15–41)
Albumin: 2.2 g/dL — ABNORMAL LOW (ref 3.5–5.0)
Alkaline Phosphatase: 177 U/L — ABNORMAL HIGH (ref 38–126)
Bilirubin, Direct: 0.5 mg/dL — ABNORMAL HIGH (ref 0.0–0.2)
Indirect Bilirubin: 0.2 mg/dL — ABNORMAL LOW (ref 0.3–0.9)
Total Bilirubin: 0.7 mg/dL (ref 0.0–1.2)
Total Protein: 4.3 g/dL — ABNORMAL LOW (ref 6.5–8.1)

## 2024-09-19 LAB — CBC
HCT: 31.9 % — ABNORMAL LOW (ref 39.0–52.0)
Hemoglobin: 10.8 g/dL — ABNORMAL LOW (ref 13.0–17.0)
MCH: 29.3 pg (ref 26.0–34.0)
MCHC: 33.9 g/dL (ref 30.0–36.0)
MCV: 86.7 fL (ref 80.0–100.0)
Platelets: 76 10*3/uL — ABNORMAL LOW (ref 150–400)
RBC: 3.68 MIL/uL — ABNORMAL LOW (ref 4.22–5.81)
RDW: 14.6 % (ref 11.5–15.5)
WBC: 11.1 10*3/uL — ABNORMAL HIGH (ref 4.0–10.5)
nRBC: 0.2 % (ref 0.0–0.2)

## 2024-09-19 LAB — GLUCOSE, CAPILLARY: Glucose-Capillary: 85 mg/dL (ref 70–99)

## 2024-09-19 MED ORDER — MIDODRINE HCL 5 MG PO TABS
5.0000 mg | ORAL_TABLET | Freq: Three times a day (TID) | ORAL | Status: AC
  Administered 2024-09-19 – 2024-09-22 (×11): 5 mg via ORAL
  Filled 2024-09-19 (×13): qty 1

## 2024-09-19 MED ORDER — DEXTROSE 50 % IV SOLN
1.0000 | Freq: Once | INTRAVENOUS | Status: AC
  Administered 2024-09-19: 50 mL via INTRAVENOUS
  Filled 2024-09-19: qty 50

## 2024-09-19 MED ORDER — DEXTROSE IN LACTATED RINGERS 5 % IV SOLN: INTRAVENOUS | Status: AC

## 2024-09-19 NOTE — Evaluation (Signed)
 Clinical/Bedside Swallow Evaluation Patient Details  Name: Benjamin [name] MRN: [id number] Date of Birth: 07-Dec-1932  Today's Date: 09/19/2024 Time: SLP Start Time (ACUTE ONLY): 1023 SLP Stop Time (ACUTE ONLY): 1032 SLP Time Calculation (min) (ACUTE ONLY): 9 min  Past Medical History:  Past Medical History:  Diagnosis Date   Hypercholesteremia    Thyroid disease    Urinary retention    Past Surgical History:  Past Surgical History:  Procedure Laterality Date   BACK SURGERY     sciatic nerve    COLONOSCOPY  10/2009   Dr. Harvey: frequent diverticula, pathology with lymphocytic colitis. Large internal hemorrhoids.    ERCP N/A 09/15/2024   Procedure: ERCP, WITH INTERVENTION IF INDICATED;  Surgeon: Wilhelmenia [name], MD;  Location: [facility] ENDOSCOPY;  Service: Gastroenterology;  Laterality: N/A;   FLEXIBLE SIGMOIDOSCOPY N/A 11/12/2016   Procedure: FLEXIBLE SIGMOIDOSCOPY;  Surgeon: Lamar CHRISTELLA Hollingshead, MD;  Location: [facility] ENDO SUITE;  Service: Endoscopy;  Laterality: N/A;   IR BILIARY DRAIN PLACEMENT WITH CHOLANGIOGRAM  09/15/2024   IR PERC CHOLECYSTOSTOMY  09/14/2024   TRANSURETHRAL RESECTION OF BLADDER TUMOR Bilateral 10/31/2014   Procedure: CYSTO, BLADDER BIOPSY/CLOT EVACUATION, , BILATERAL RETROGRADE PYELOGRAM,BILATERAL URETERAL STENT PLACEMENT;  Surgeon: Ricardo Likens, MD;  Location: [facility] ORS;  Service: Urology;  Laterality: Bilateral;   HPI:  Pt is a 89 y/o male presenting on 1/26 due to 2 week flu like symptoms, weakness, and decreased oral intake. CT with cholelithiasis and choledocholithiasis, suspicion for acute cholecystitis. CT angio negative PE but showed large iatal hernia. 09/14/24 transferred to [facility], s/p cholecystotomy tube placement. 1/29 s/p ERCP, per radiology failed but placed drainage cath. PMH includes: thyroid disease, urinary retention, prior back surgery.    Assessment / Plan / Recommendation  Clinical Impression  Will advance to regular solids and continue thin liquids. He  independently uses strategies to compensate for missing dentition. Ongoing SLP f/u is not needed, will sign off at this time.   Pt's oral hygiene is poor with xerostomia and missing dentition. Pt was intubated briefly for procedures but his vocal quality is strong and clear. He feeds himself, pacing himself to compensate for dentition to clear his oral cavity completely. He indpendently uses a liquid wash to clear any oral residuals. No signs clinically concerning for aspiration were observed with consecutive cup sips of thin liquids.   SLP Visit Diagnosis: Dysphagia, unspecified (R13.10)    Aspiration Risk  Mild aspiration risk    Diet Recommendation           Other Recommendations Oral Care Recommendations: Oral care BID     Swallow Evaluation Recommendations Recommendations: PO diet PO Diet Recommendation: Regular;Thin liquids (Level 0) Liquid Administration via: Cup;Straw Medication Administration: Whole meds with liquid Supervision: Patient able to self-feed;Set-up assistance for safety Swallowing strategies  : Minimize environmental distractions;Slow rate;Small bites/sips Postural changes: Position pt fully upright for meals Oral care recommendations: Oral care BID (2x/day)   Assistance Recommended at Discharge    Functional Status Assessment Patient has not had a recent decline in their functional status  Frequency and Duration            Prognosis Prognosis for improved oropharyngeal function: Good Barriers to Reach Goals: Time post onset      Swallow Study   General HPI: Pt is a 89 y/o male presenting on 1/26 due to 2 week flu like symptoms, weakness, and decreased oral intake. CT with cholelithiasis and choledocholithiasis, suspicion for acute cholecystitis. CT angio negative PE but showed large  iatal hernia. 09/14/24 transferred to [facility], s/p cholecystotomy tube placement. 1/29 s/p ERCP, per radiology failed but placed drainage cath. PMH includes: thyroid disease, urinary  retention, prior back surgery. Type of Study: Bedside Swallow Evaluation Previous Swallow Assessment: none in chart Diet Prior to this Study: Clear liquid diet Temperature Spikes Noted: No Respiratory Status: Nasal cannula History of Recent Intubation: No Behavior/Cognition: Alert;Cooperative;Pleasant mood Oral Cavity Assessment: Within Functional Limits Oral Care Completed by SLP: No Oral Cavity - Dentition: Missing dentition;Poor condition Vision: Functional for self-feeding Self-Feeding Abilities: Able to feed self Patient Positioning: Upright in bed Baseline Vocal Quality: Normal Volitional Cough: Strong Volitional Swallow: Able to elicit    Oral/Motor/Sensory Function Overall Oral Motor/Sensory Function: Within functional limits   Ice Chips Ice chips: Not tested   Thin Liquid Thin Liquid: Within functional limits Presentation: Cup;Self Fed    Nectar Thick Nectar Thick Liquid: Not tested   Honey Thick Honey Thick Liquid: Not tested   Puree Puree: Not tested   Solid     Solid: Within functional limits Presentation: Self Fed      Damien Blumenthal, M.A., CCC-SLP Speech Language Pathology, Acute Rehabilitation Services  Secure Chat preferred [phone]  09/19/2024,10:56 AM

## 2024-09-19 NOTE — Progress Notes (Signed)
 Physical Therapy Treatment Patient Details Name: Benjamin [name] MRN: [id number] DOB: [birth year] Today's Date: 09/19/2024   History of Present Illness Pt is a 89 y/o male presenting on 1/26 due to 2 week flu like symptoms, weakness, and decreased oral intake. CT with cholelithiasis and choledocholithiasis, suspicion for acute cholecystitis. CT angio negative PE but showed large iatal hernia. 09/14/24 transferred to [facility], s/p cholecystotomy tube placement. 1/29 s/p ERCP, per radiology failed but placed drainage cath.  PMH includes: thyroid disease, urinary retention, prior back surgery.    PT Comments  Pt received in supine. Pt very pleasant and receptive to participation in therapy. He required mod assist rolling, max assist sidelying to sit, and +2 max assist sit to sidelying. Poor sitting balance EOB. Session limited due to need for return to supine for bedpan. <3/5 strength noted BLE. Current POC remains appropriate. PT to continue efforts to mobilize.     If plan is discharge home, recommend the following: Two people to help with walking and/or transfers;Two people to help with bathing/dressing/bathroom   Can travel by private vehicle     No  Equipment Recommendations  Other (comment) (TBD)    Recommendations for Other Services       Precautions / Restrictions Precautions Precautions: Fall;Other (comment) Precaution/Restrictions Comments: 2 drains R abd, foley     Mobility  Bed Mobility Overal bed mobility: Needs Assistance Bed Mobility: Rolling, Sidelying to Sit, Sit to Sidelying Rolling: Mod assist Sidelying to sit: Max assist     Sit to sidelying: Max assist, +2 for physical assistance General bed mobility comments: assist with BLE and trunk    Transfers                   General transfer comment: unable to attempt stand/OOB due to need to return to supine for bedpan    Ambulation/Gait                   Stairs             Wheelchair  Mobility     Tilt Bed    Modified Rankin (Stroke Patients Only)       Balance Overall balance assessment: Needs assistance Sitting-balance support: Single extremity supported, Feet supported, Bilateral upper extremity supported Sitting balance-Leahy Scale: Poor Sitting balance - Comments: min assist to maintain balance EOB Postural control: Posterior lean                                  Communication Communication Communication: No apparent difficulties  Cognition Arousal: Alert Behavior During Therapy: WFL for tasks assessed/performed   PT - Cognitive impairments: Memory, Sequencing, Problem solving, Orientation   Orientation impairments: Time                     Following commands: Impaired Following commands impaired: Follows one step commands with increased time    Cueing Cueing Techniques: Verbal cues, Tactile cues, Visual cues  Exercises      General Comments General comments (skin integrity, edema, etc.): VSS on 2L. BP checked in sitting, stable.      Pertinent Vitals/Pain Pain Assessment Pain Assessment: No/denies pain    Home Living                          Prior Function            PT Goals (  current goals can now be found in the care plan section) Acute Rehab PT Goals Patient Stated Goal: get stronger Progress towards PT goals: Progressing toward goals    Frequency    Min 2X/week      PT Plan      Co-evaluation              AM-PAC PT 6 Clicks Mobility   Outcome Measure  Help needed turning from your back to your side while in a flat bed without using bedrails?: A Lot Help needed moving from lying on your back to sitting on the side of a flat bed without using bedrails?: Total Help needed moving to and from a bed to a chair (including a wheelchair)?: Total Help needed standing up from a chair using your arms (e.g., wheelchair or bedside chair)?: Total Help needed to walk in hospital room?:  Total Help needed climbing 3-5 steps with a railing? : Total 6 Click Score: 7    End of Session Equipment Utilized During Treatment: Oxygen Activity Tolerance: Patient tolerated treatment well Patient left: in bed;with call bell/phone within reach;with nursing/sitter in room Nurse Communication: Mobility status PT Visit Diagnosis: Muscle weakness (generalized) (M62.81);Difficulty in walking, not elsewhere classified (R26.2)     Time: 9185-9166 PT Time Calculation (min) (ACUTE ONLY): 19 min  Charges:    $Therapeutic Activity: 8-22 mins PT General Charges $$ ACUTE PT VISIT: 1 Visit                     Sari MATSU., PT  Office # [phone]    Erven Sari Shaker 09/19/2024, 11:02 AM

## 2024-09-20 ENCOUNTER — Inpatient Hospital Stay (HOSPITAL_COMMUNITY)

## 2024-09-20 ENCOUNTER — Telehealth: Payer: Self-pay | Admitting: Gastroenterology

## 2024-09-20 LAB — FERRITIN: Ferritin: 418 ng/mL — ABNORMAL HIGH (ref 24–336)

## 2024-09-20 LAB — COMPREHENSIVE METABOLIC PANEL WITH GFR
ALT: 239 U/L — ABNORMAL HIGH (ref 0–44)
AST: 168 U/L — ABNORMAL HIGH (ref 15–41)
Albumin: 2.1 g/dL — ABNORMAL LOW (ref 3.5–5.0)
Alkaline Phosphatase: 133 U/L — ABNORMAL HIGH (ref 38–126)
Anion gap: 14 (ref 5–15)
BUN: 70 mg/dL — ABNORMAL HIGH (ref 8–23)
CO2: 23 mmol/L (ref 22–32)
Calcium: 7.4 mg/dL — ABNORMAL LOW (ref 8.9–10.3)
Chloride: 97 mmol/L — ABNORMAL LOW (ref 98–111)
Creatinine, Ser: 4.83 mg/dL — ABNORMAL HIGH (ref 0.61–1.24)
GFR, Estimated: 11 mL/min — ABNORMAL LOW
Glucose, Bld: 91 mg/dL (ref 70–99)
Potassium: 5 mmol/L (ref 3.5–5.1)
Sodium: 134 mmol/L — ABNORMAL LOW (ref 135–145)
Total Bilirubin: 0.5 mg/dL (ref 0.0–1.2)
Total Protein: 4.3 g/dL — ABNORMAL LOW (ref 6.5–8.1)

## 2024-09-20 LAB — FOLATE: Folate: 11.1 ng/mL

## 2024-09-20 LAB — IRON AND TIBC
Iron: 58 ug/dL (ref 45–182)
Saturation Ratios: 51 % — ABNORMAL HIGH (ref 17.9–39.5)
TIBC: 113 ug/dL — ABNORMAL LOW (ref 250–450)
UIBC: 55 ug/dL

## 2024-09-20 LAB — RETICULOCYTES
Immature Retic Fract: 28 % — ABNORMAL HIGH (ref 2.3–15.9)
RBC.: 3.6 MIL/uL — ABNORMAL LOW (ref 4.22–5.81)
Retic Count, Absolute: 56.8 10*3/uL (ref 19.0–186.0)
Retic Ct Pct: 1.6 % (ref 0.4–3.1)

## 2024-09-20 LAB — CBC
HCT: 32.6 % — ABNORMAL LOW (ref 39.0–52.0)
Hemoglobin: 10.8 g/dL — ABNORMAL LOW (ref 13.0–17.0)
MCH: 29.7 pg (ref 26.0–34.0)
MCHC: 33.1 g/dL (ref 30.0–36.0)
MCV: 89.6 fL (ref 80.0–100.0)
Platelets: 93 10*3/uL — ABNORMAL LOW (ref 150–400)
RBC: 3.64 MIL/uL — ABNORMAL LOW (ref 4.22–5.81)
RDW: 15 % (ref 11.5–15.5)
WBC: 12.5 10*3/uL — ABNORMAL HIGH (ref 4.0–10.5)
nRBC: 0 % (ref 0.0–0.2)

## 2024-09-20 LAB — VITAMIN B12: Vitamin B-12: >4000 pg/mL — ABNORMAL HIGH (ref 180–914)

## 2024-09-20 LAB — LACTATE DEHYDROGENASE: LDH: 624 U/L — ABNORMAL HIGH (ref 105–235)

## 2024-09-20 LAB — GLUCOSE, CAPILLARY
Glucose-Capillary: 115 mg/dL — ABNORMAL HIGH (ref 70–99)
Glucose-Capillary: 87 mg/dL (ref 70–99)
Glucose-Capillary: 92 mg/dL (ref 70–99)
Glucose-Capillary: 99 mg/dL (ref 70–99)

## 2024-09-20 LAB — TSH: TSH: 19.9 u[IU]/mL — ABNORMAL HIGH (ref 0.350–4.500)

## 2024-09-20 MED ORDER — TAMSULOSIN HCL 0.4 MG PO CAPS
0.4000 mg | ORAL_CAPSULE | Freq: Every day | ORAL | Status: AC
  Administered 2024-09-20 – 2024-09-23 (×4): 0.4 mg via ORAL
  Filled 2024-09-20 (×4): qty 1

## 2024-09-20 MED ORDER — LIDOCAINE-EPINEPHRINE 1 %-1:100000 IJ SOLN
INTRAMUSCULAR | Status: AC
  Filled 2024-09-20: qty 1

## 2024-09-20 MED ORDER — LEVOTHYROXINE SODIUM 25 MCG PO TABS
12.5000 ug | ORAL_TABLET | Freq: Every day | ORAL | Status: DC
  Administered 2024-09-20 – 2024-09-21 (×2): 12.5 ug via ORAL
  Filled 2024-09-20 (×2): qty 1

## 2024-09-20 MED ORDER — HEPARIN SODIUM (PORCINE) 1000 UNIT/ML IJ SOLN
INTRAMUSCULAR | Status: AC
  Filled 2024-09-20: qty 10

## 2024-09-20 MED ORDER — FUROSEMIDE 10 MG/ML IJ SOLN
160.0000 mg | Freq: Once | INTRAVENOUS | Status: AC
  Administered 2024-09-20: 160 mg via INTRAVENOUS
  Filled 2024-09-20: qty 16

## 2024-09-20 MED ORDER — SODIUM CHLORIDE 0.9% FLUSH: 10.0000 mL | INTRAVENOUS | Status: AC | PRN

## 2024-09-20 MED ORDER — CHLORHEXIDINE GLUCONATE CLOTH 2 % EX PADS
6.0000 | MEDICATED_PAD | Freq: Every day | CUTANEOUS | Status: AC
  Administered 2024-09-21 – 2024-09-23 (×3): 6 via TOPICAL

## 2024-09-20 MED ORDER — LIDOCAINE-EPINEPHRINE 1 %-1:100000 IJ SOLN
10.0000 mL | Freq: Once | INTRAMUSCULAR | Status: AC
  Administered 2024-09-20: 10 mL via INTRADERMAL
  Filled 2024-09-20: qty 20

## 2024-09-20 NOTE — Procedures (Signed)
 Vascular and Interventional Radiology Procedure Note  Patient: Benjamin [name] DOB: Oct 10, 1932 Medical Record Number: [id number] Note Date/Time: 09/20/24 11:38 AM   Performing Physician: Thom Hall, MD Assistant(s): None  Diagnosis: ESRD requiring Hemodialysis  Procedure: NON-TUNNELED HEMODIALYSIS CATHETER PLACEMENT  Anesthesia: Local Anesthetic Complications: None Estimated Blood Loss: Minimal Specimens:  None  Findings:  Successful placement of a right-sided, 19.5 cm NON-tunneled hemodialysis catheter with the tip of the catheter in the proximal right atrium.  Plan: Catheter ready for use. This catheter may be converted to a tunneled dialysis catheter at a later date as indicated.  See detailed procedure note with images in PACS. The patient tolerated the procedure well without incident or complication and was returned to Recovery in stable condition.    Thom Hall, MD [facility]iology [facility] Radiology   Pager. [phone] Clinic. [phone]

## 2024-09-20 NOTE — Telephone Encounter (Signed)
 Patty,  Can you help with scheduling this follow up appt with Dr. Wilhelmenia in 3-4 weeks.  I do not have access to overbook.  Thanks [employer]

## 2024-09-20 NOTE — Progress Notes (Signed)
 "   Referring Physician(s): [name], Aloha  Supervising Physician: Jennefer Rover  Patient Status:  [facility] - In-pt  Chief Complaint: acute calculous cholecystitis, acute cholangitis, s/p biliary drains placed on 1/28 by Dr Hughes and 1/29 by Dr Jenna   Subjective: Patient lying in bed. Gown is moist and both biliary drains have saturated gauze with serosanguinous drainage on them, bedside RN to change dressings.   Allergies: Patient has no known allergies.  Medications: Prior to Admission medications  Not on File    Vital Signs: BP (!) 114/58   Pulse 77   Temp 98.4 F (36.9 C)   Resp 18   Ht 5' 7 (1.702 m)   Wt 127 lb 10.3 oz (57.9 kg) Comment: from chart  SpO2 94%   BMI 19.99 kg/m   Physical Exam Cardiovascular:     Rate and Rhythm: Normal rate.  Pulmonary:     Effort: Pulmonary effort is normal. No respiratory distress.  Abdominal:     Palpations: Abdomen is soft.     Tenderness: There is no abdominal tenderness.     Comments: RUQ biliary drains x2- Both drains flush easily Drain 1 with trace bilious output in the bag Drain 2 with adequate bilious output in the bag Insertion sites leaking Sutures in place Dressed appropriately  Skin:    General: Skin is warm.  Neurological:     Mental Status: He is alert. He is disoriented.     Imaging: IR NON-TUNNELED CENTRAL VENOUS CATH [facility] W IMG Result Date: 09/20/2024 INDICATION: ESRD requiring HD initiation. EXAM: NON-TUNNELED CENTRAL VENOUS HEMODIALYSIS CATHETER PLACEMENT WITH ULTRASOUND AND FLUOROSCOPIC GUIDANCE COMPARISON:  Chest XR, 09/15/2024 MEDICATIONS: None FLUOROSCOPY: Radiation Exposure Index and estimated peak skin dose (PSD); Reference air kerma (RAK), 0.2 mGy. COMPLICATIONS: None immediate. PROCEDURE: Informed written consent was obtained from the patient and/or patient's representative after a discussion of the risks, benefits, and alternatives to treatment. Questions regarding the procedure were  encouraged and answered. The RIGHT neck and chest were prepped with chlorhexidine  in a sterile fashion, and a sterile drape was applied covering the operative field. Maximum barrier sterile technique with sterile gowns and gloves were used for the procedure. A timeout was performed prior to the initiation of the procedure. After the overlying soft tissues were anesthetized, a small venotomy incision was created and a micropuncture kit was utilized to access the internal jugular vein. Real-time ultrasound guidance was utilized for vascular access including the acquisition of a permanent ultrasound image documenting patency of the accessed vessel. The microwire was utilized to measure appropriate catheter length. A stiff glidewire was advanced to the level of the IVC. Under fluoroscopic guidance, the venotomy was serially dilated, ultimately allowing placement of a 20 cm temporary Trialysis catheter with tip ultimately terminating within the superior aspect of the right atrium. Final catheter positioning was confirmed and documented with a spot radiographic image. The catheter aspirates and flushes normally. The catheter was flushed with appropriate volume heparin  dwells. The catheter exit site was secured with a 2-0 Ethilon retention suture. A dressing was placed. The patient tolerated the procedure well without immediate post procedural complication. IMPRESSION: Successful placement of a RIGHT internal jugular approach 20 cm temporary dialysis catheter. The catheter is ready for immediate use. PLAN: This catheter may be converted to a tunneled dialysis catheter at a later date as indicated. Thom Hughes, MD [facility] Interventional Radiology [facility] Electronically Signed   By: Thom Hughes M.D.   On: 09/20/2024 11:44  Labs:  CBC: Recent Labs    09/17/24 0856 09/18/24 0717 09/19/24 0417 09/20/24 0256  WBC 10.9* 9.4 11.1* 12.5*  HGB 8.5* 9.4* 10.8* 10.8*  HCT 24.9* 27.5*  31.9* 32.6*  PLT 85* 75* 76* 93*    COAGS: Recent Labs    09/14/24 0805 09/15/24 0502  INR 1.5* 1.5*    BMP: Recent Labs    09/17/24 0820 09/18/24 0717 09/19/24 0417 09/20/24 0256  NA 134* 135 135 134*  K 4.4 4.7 5.0 5.0  CL 98 97* 97* 97*  CO2 27 26 21* 23  GLUCOSE 88 65* 61* 91  BUN 48* 56* 63* 70*  CALCIUM  7.0* 7.1* 7.1* 7.4*  CREATININE 3.04* 3.69* 4.21* 4.83*  GFRNONAA 19* 15* 13* 11*    LIVER FUNCTION TESTS: Recent Labs    09/15/24 0502 09/16/24 0658 09/17/24 0820 09/18/24 0717 09/19/24 0417 09/20/24 0256  BILITOT 1.5* 0.9  --   --  0.7 0.5  AST 1,409* 906*  --   --  269* 168*  ALT 779* 491*  --   --  267* 239*  ALKPHOS 412* 249*  --   --  177* 133*  PROT 5.3* 4.5*  --   --  4.3* 4.3*  ALBUMIN  2.6* 1.9* 2.6* 2.2* 2.2*  2.2* 2.1*    Assessment and Plan:  Afebrile WBC 12.5 today Drain 1 has had trace bilious output in the bag since 2/1 Drain 2 has had adequate bilious drainage  Plan for biliary drain check and possible capping tomorrow 2/4 per Dr Jennefer Ribas Location: RUQ Size: Fr size: 10 Fr Date of placement: 09/14/2024  Currently to: Drain collection device: gravity  Drain Location: RUQ Size: Fr size: 10 Fr Date of placement: 09/15/2024  Currently to: Drain collection device: gravity  24 hour output:  Output by Drain (mL) 09/18/24 0701 - 09/18/24 1900 09/18/24 1901 - 09/19/24 0700 09/19/24 0701 - 09/19/24 1900 09/19/24 1901 - 09/20/24 0700 09/20/24 0701 - 09/20/24 1610  Biliary Tube RUQ 1110 50 20 0   Biliary Tube Other (Comment) 10 Fr. RUQ 15 0 480 425 45    Interval imaging/drain manipulation:  None  Current examination: Both drains flush easily.  Drain 1 with trace bilious output in the bag. Drain 2 with adequate bilious output in the bag. Insertion sites leaking. Sutures in place. Dressed appropriately.   Plan: Continue TID flushes with 5 cc NS. Record output Q shift. Dressing changes QD or PRN if soiled.  Call IR APP  or on call IR MD if difficulty flushing or sudden change in drain output.   IR will continue to follow - please call with questions or concerns.   Electronically Signed: Virginio Isidore B Kyeisha Janowicz, NP 09/20/2024, 4:09 PM   I spent a total of 15 Minutes at the the patient's bedside AND on the patient's hospital floor or unit, greater than 50% of which was counseling/coordinating care for biliary drain follow up.  "

## 2024-09-21 ENCOUNTER — Inpatient Hospital Stay (HOSPITAL_COMMUNITY)

## 2024-09-21 ENCOUNTER — Other Ambulatory Visit: Payer: Self-pay

## 2024-09-21 DIAGNOSIS — K8031 Calculus of bile duct with cholangitis, unspecified, with obstruction: Secondary | ICD-10-CM

## 2024-09-21 DIAGNOSIS — K810 Acute cholecystitis: Secondary | ICD-10-CM | POA: Diagnosis not present

## 2024-09-21 LAB — CBC
HCT: 32.7 % — ABNORMAL LOW (ref 39.0–52.0)
Hemoglobin: 10.6 g/dL — ABNORMAL LOW (ref 13.0–17.0)
MCH: 29.2 pg (ref 26.0–34.0)
MCHC: 32.4 g/dL (ref 30.0–36.0)
MCV: 90.1 fL (ref 80.0–100.0)
Platelets: 106 10*3/uL — ABNORMAL LOW (ref 150–400)
RBC: 3.63 MIL/uL — ABNORMAL LOW (ref 4.22–5.81)
RDW: 15.1 % (ref 11.5–15.5)
WBC: 12 10*3/uL — ABNORMAL HIGH (ref 4.0–10.5)
nRBC: 0 % (ref 0.0–0.2)

## 2024-09-21 LAB — COMPREHENSIVE METABOLIC PANEL WITH GFR
ALT: 207 U/L — ABNORMAL HIGH (ref 0–44)
AST: 119 U/L — ABNORMAL HIGH (ref 15–41)
Albumin: 2.2 g/dL — ABNORMAL LOW (ref 3.5–5.0)
Alkaline Phosphatase: 126 U/L (ref 38–126)
Anion gap: 16 — ABNORMAL HIGH (ref 5–15)
BUN: 78 mg/dL — ABNORMAL HIGH (ref 8–23)
CO2: 21 mmol/L — ABNORMAL LOW (ref 22–32)
Calcium: 7.5 mg/dL — ABNORMAL LOW (ref 8.9–10.3)
Chloride: 97 mmol/L — ABNORMAL LOW (ref 98–111)
Creatinine, Ser: 5.42 mg/dL — ABNORMAL HIGH (ref 0.61–1.24)
GFR, Estimated: 9 mL/min — ABNORMAL LOW
Glucose, Bld: 93 mg/dL (ref 70–99)
Potassium: 5.1 mmol/L (ref 3.5–5.1)
Sodium: 133 mmol/L — ABNORMAL LOW (ref 135–145)
Total Bilirubin: 0.5 mg/dL (ref 0.0–1.2)
Total Protein: 4.5 g/dL — ABNORMAL LOW (ref 6.5–8.1)

## 2024-09-21 LAB — T3, FREE: T3, Free: 1.7 pg/mL — ABNORMAL LOW (ref 2.0–4.4)

## 2024-09-21 LAB — GLUCOSE, CAPILLARY
Glucose-Capillary: 118 mg/dL — ABNORMAL HIGH (ref 70–99)
Glucose-Capillary: 85 mg/dL (ref 70–99)

## 2024-09-21 MED ORDER — ALTEPLASE 2 MG IJ SOLR: 2.0000 mg | Freq: Once | INTRAMUSCULAR | Status: DC | PRN

## 2024-09-21 MED ORDER — HEPARIN SODIUM (PORCINE) 1000 UNIT/ML DIALYSIS: 100.0000 [IU]/kg | INTRAMUSCULAR | Status: DC | PRN

## 2024-09-21 MED ORDER — IOHEXOL 300 MG/ML  SOLN
50.0000 mL | Freq: Once | INTRAMUSCULAR | Status: AC | PRN
  Administered 2024-09-21: 10 mL

## 2024-09-21 MED ORDER — ANTICOAGULANT SODIUM CITRATE 4% (200MG/5ML) IV SOLN: 5.0000 mL | Status: DC | PRN

## 2024-09-21 MED ORDER — HEPARIN SODIUM (PORCINE) 1000 UNIT/ML IJ SOLN
INTRAMUSCULAR | Status: AC
  Filled 2024-09-21: qty 5

## 2024-09-21 MED ORDER — HEPARIN SODIUM (PORCINE) 1000 UNIT/ML IJ SOLN
INTRAMUSCULAR | Status: AC
  Filled 2024-09-21: qty 2

## 2024-09-21 MED ORDER — LIDOCAINE-PRILOCAINE 2.5-2.5 % EX CREA: 1.0000 | TOPICAL_CREAM | CUTANEOUS | Status: DC | PRN

## 2024-09-21 MED ORDER — LEVOTHYROXINE SODIUM 100 MCG/5ML IV SOLN
12.5000 ug | Freq: Every day | INTRAVENOUS | Status: DC
  Administered 2024-09-21 – 2024-09-23 (×3): 12.5 ug via INTRAVENOUS
  Filled 2024-09-21 (×4): qty 5

## 2024-09-21 MED ORDER — HEPARIN SODIUM (PORCINE) 1000 UNIT/ML DIALYSIS
1000.0000 [IU] | INTRAMUSCULAR | Status: DC | PRN
  Administered 2024-09-21: 2400 [IU]

## 2024-09-21 MED ORDER — LIDOCAINE HCL (PF) 1 % IJ SOLN: 5.0000 mL | INTRAMUSCULAR | Status: DC | PRN

## 2024-09-21 MED ORDER — PENTAFLUOROPROP-TETRAFLUOROETH EX AERO: 1.0000 | INHALATION_SPRAY | CUTANEOUS | Status: DC | PRN

## 2024-09-21 NOTE — Progress Notes (Signed)
 Occupational Therapy Treatment Patient Details Name: Benjamin [name] MRN: [id number] DOB: 23-Dec-1932 Today's Date: 09/21/2024   History of present illness Pt is a 89 y/o male presenting on 1/26 due to 2 week flu like symptoms, weakness, and decreased oral intake. CT with cholelithiasis and choledocholithiasis, suspicion for acute cholecystitis. CT angio negative PE but showed large iatal hernia. 09/14/24 transferred to [facility], s/p cholecystotomy tube placement. 1/29 s/p ERCP, per radiology failed but placed drainage cath.  PMH includes: thyroid disease, urinary retention, prior back surgery.   OT comments  Pt supine in bed, lethargic but following simple 1 step commands with increased time.  Requires +2 total assist for bed mobility, sitting EOB with mod to at best contact guard assist.  Limited functional use of BUEs due to fatigue and generalized weakness. RN changing dressing to R flank due to soiled at EOB.  Pt able to recall month/situation after reoriented at beginning of session. Will follow acutely. Continue to recommend <3hrs/day inpt setting at dc.      If plan is discharge home, recommend the following:  Two people to help with walking and/or transfers;Two people to help with bathing/dressing/bathroom;Direct supervision/assist for medications management;Direct supervision/assist for financial management;Assist for transportation;Help with stairs or ramp for entrance;Assistance with cooking/housework;Supervision due to cognitive status   Equipment Recommendations  Other (comment) (defer)    Recommendations for Other Services      Precautions / Restrictions Precautions Precautions: Fall;Other (comment) Recall of Precautions/Restrictions: Impaired Precaution/Restrictions Comments: 2 drains R abd, foley Restrictions Weight Bearing Restrictions Per Provider Order: No       Mobility Bed Mobility Overal bed mobility: Needs Assistance Bed Mobility: Rolling, Sidelying to Sit, Sit to  Sidelying Rolling: Max assist, +2 for physical assistance Sidelying to sit: Total assist, +2 for physical assistance Supine to sit: Total assist, +2 for physical assistance, HOB elevated     General bed mobility comments: Little initiation from pt despite max cues requiring total A +2 for all aspects    Transfers                   General transfer comment: did not attempt due to quick fatigue and weakness     Balance Overall balance assessment: Needs assistance Sitting-balance support: Single extremity supported, Feet supported, Bilateral upper extremity supported Sitting balance-Leahy Scale: Poor Sitting balance - Comments: Mod A progressing to periods of CGA sitting EOB.                                   ADL either performed or assessed with clinical judgement   ADL Overall ADL's : Needs assistance/impaired     Grooming: Maximal assistance;Wash/dry face;Wash/dry hands;Sitting Grooming Details (indicate cue type and reason): hand over hand needed, max assist             Lower Body Dressing: Total assistance;+2 for physical assistance;Bed level;Sitting/lateral leans     Toilet Transfer Details (indicate cue type and reason): deferred         Functional mobility during ADLs: Total assistance;+2 for physical assistance      Extremity/Trunk Assessment Upper Extremity Assessment Upper Extremity Assessment: Generalized weakness   Lower Extremity Assessment Lower Extremity Assessment: Defer to PT evaluation        Vision       Perception     Praxis     Communication Communication Communication: Impaired Factors Affecting Communication: Difficulty expressing self;Reduced clarity of speech  Cognition Arousal: Lethargic Behavior During Therapy: Flat affect Cognition: Cognition impaired   Orientation impairments: Situation, Time, Place Awareness: Intellectual awareness impaired Memory impairment (select all impairments): Short-term  memory, Working civil service fast streamer, Non-declarative long-term memory, Geneticist, Molecular long-term memory Attention impairment (select first level of impairment): Sustained attention Executive functioning impairment (select all impairments): Initiation, Organization, Sequencing, Reasoning, Problem solving OT - Cognition Comments: pt oriented to self, but very lethragic today.  pt unable to state location, but as he becomes more alert able to state month without choices.  following commands well with increased time.                 Following commands: Impaired Following commands impaired: Follows one step commands with increased time, Follows one step commands inconsistently      Cueing   Cueing Techniques: Verbal cues, Tactile cues, Visual cues  Exercises      Shoulder Instructions       General Comments SpO2 100% on [place] upon entry and remaining WFL on RA during session. Reading 88% at end of session in flat bed so [place] donned, but poor pleth.    Pertinent Vitals/ Pain       Pain Assessment Pain Assessment: Faces Faces Pain Scale: Hurts little more Pain Location: generalized with mobility, pt doesn't specify location Pain Descriptors / Indicators: Discomfort Pain Intervention(s): Limited activity within patient's tolerance, Monitored during session, Repositioned  Home Living                                          Prior Functioning/Environment              Frequency  Min 2X/week        Progress Toward Goals  OT Goals(current goals can now be found in the care plan section)  Progress towards OT goals: Progressing toward goals (slowly)  Acute Rehab OT Goals Patient Stated Goal: none stated OT Goal Formulation: Patient unable to participate in goal setting Time For Goal Achievement: 09/30/24 Potential to Achieve Goals: Fair  Plan      Co-evaluation    PT/OT/SLP Co-Evaluation/Treatment: Yes Reason for Co-Treatment: Complexity of the patient's impairments  (multi-system involvement);To address functional/ADL transfers;For patient/therapist safety PT goals addressed during session: Mobility/safety with mobility;Balance OT goals addressed during session: ADL's and self-care      AM-PAC OT 6 Clicks Daily Activity     Outcome Measure   Help from another person eating meals?: A Lot Help from another person taking care of personal grooming?: A Lot Help from another person toileting, which includes using toliet, bedpan, or urinal?: Total Help from another person bathing (including washing, rinsing, drying)?: A Lot Help from another person to put on and taking off regular upper body clothing?: A Lot Help from another person to put on and taking off regular lower body clothing?: Total 6 Click Score: 10    End of Session Equipment Utilized During Treatment: Oxygen  OT Visit Diagnosis: Other abnormalities of gait and mobility (R26.89);Muscle weakness (generalized) (M62.81);Other symptoms and signs involving cognitive function   Activity Tolerance Patient tolerated treatment well   Patient Left in bed;with call bell/phone within reach;with nursing/sitter in room   Nurse Communication Mobility status        Time: 9088-9052 OT Time Calculation (min): 36 min  Charges: OT General Charges $OT Visit: 1 Visit OT Treatments $Self Care/Home Management : 8-22 mins  Etta NOVAK,  OT Acute Rehabilitation Services Office [phone] Secure Chat Preferred    Etta GORMAN Hope 09/21/2024, 11:04 AM

## 2024-09-21 NOTE — Evaluation (Signed)
 Clinical/Bedside Swallow Evaluation Patient Details  Name: Benjamin [name] MRN: [id number] Date of Birth: 1933/01/26  Today's Date: 09/21/2024 Time: SLP Start Time (ACUTE ONLY): 1636 SLP Stop Time (ACUTE ONLY): 1646 SLP Time Calculation (min) (ACUTE ONLY): 10 min  Past Medical History:  Past Medical History:  Diagnosis Date   Hypercholesteremia    Thyroid disease    Urinary retention    Past Surgical History:  Past Surgical History:  Procedure Laterality Date   BACK SURGERY     sciatic nerve    COLONOSCOPY  10/2009   Dr. Harvey: frequent diverticula, pathology with lymphocytic colitis. Large internal hemorrhoids.    ERCP N/A 09/15/2024   Procedure: ERCP, WITH INTERVENTION IF INDICATED;  Surgeon: Wilhelmenia [name], MD;  Location: [facility] ENDOSCOPY;  Service: Gastroenterology;  Laterality: N/A;   FLEXIBLE SIGMOIDOSCOPY N/A 11/12/2016   Procedure: FLEXIBLE SIGMOIDOSCOPY;  Surgeon: Lamar CHRISTELLA Hollingshead, MD;  Location: [facility] ENDO SUITE;  Service: Endoscopy;  Laterality: N/A;   IR BILIARY DRAIN PLACEMENT WITH CHOLANGIOGRAM  09/15/2024   IR CHOLANGIOGRAM EXISTING TUBE  09/21/2024   IR PERC CHOLECYSTOSTOMY  09/14/2024   IR TUNNELED CENTRAL VENOUS CATH PLC W IMG  09/20/2024   TRANSURETHRAL RESECTION OF BLADDER TUMOR Bilateral 10/31/2014   Procedure: CYSTO, BLADDER BIOPSY/CLOT EVACUATION, , BILATERAL RETROGRADE PYELOGRAM,BILATERAL URETERAL STENT PLACEMENT;  Surgeon: Ricardo Likens, MD;  Location: [facility] ORS;  Service: Urology;  Laterality: Bilateral;   HPI:  Pt is a 89 y/o male presenting on 1/26 due to 2 week flu like symptoms, weakness, and decreased oral intake. CT with cholelithiasis and choledocholithiasis, suspicion for acute cholecystitis. CT angio negative PE but showed large iatal hernia. 09/14/24 transferred to [facility], s/p cholecystotomy tube placement. 1/29 s/p ERCP, per radiology failed but placed drainage cath. BSE 2/2 grossly functional as pt independently used strategies to compensate for edentulism.  Change in mentation 2/4, prompting new consult. PMH includes: thyroid disease, urinary retention, prior back surgery.    Assessment / Plan / Recommendation  Clinical Impression  Recommend he be NPO with the exception of ice chips and meds crushed in puree if alert. SLP will continue following to assess readiness to resume PO diet.   Pt's presentation is vastly different from initial evaluation with this SLP on 2/2. He is lethargic and requires Max cueing to recognize and accept POs with total assistance. He does not follow commands. No spontaneous voicing or volitional coughing were elicited. Oral holding is prolonged and oral clearance is often incomplete. Hydrophonia and throat clearance now consistently follow small sips of water .   SLP Visit Diagnosis: Dysphagia, unspecified (R13.10)    Aspiration Risk  Moderate aspiration risk    Diet Recommendation           Other Recommendations Oral Care Recommendations: Oral care QID;Oral care prior to ice chip/H20     Swallow Evaluation Recommendations Recommendations: NPO except meds;Ice chips PRN after oral care Medication Administration: Crushed with puree Oral care recommendations: Oral care QID (4x/day);Oral care before ice chips/water    Assistance Recommended at Discharge    Functional Status Assessment Patient has had a recent decline in their functional status and demonstrates the ability to make significant improvements in function in a reasonable and predictable amount of time.  Frequency and Duration min 2x/week  2 weeks       Prognosis Prognosis for improved oropharyngeal function: Good Barriers to Reach Goals: Time post onset      Swallow Study   General HPI: Pt is a 89 y/o male presenting  on 1/26 due to 2 week flu like symptoms, weakness, and decreased oral intake. CT with cholelithiasis and choledocholithiasis, suspicion for acute cholecystitis. CT angio negative PE but showed large iatal hernia. 09/14/24 transferred to  [facility], s/p cholecystotomy tube placement. 1/29 s/p ERCP, per radiology failed but placed drainage cath. BSE 2/2 grossly functional as pt independently used strategies to compensate for edentulism. Change in mentation 2/4, prompting new consult. PMH includes: thyroid disease, urinary retention, prior back surgery. Type of Study: Bedside Swallow Evaluation Previous Swallow Assessment: see HPI Diet Prior to this Study: Regular;Thin liquids (Level 0) Temperature Spikes Noted: No Respiratory Status: Nasal cannula History of Recent Intubation: No Behavior/Cognition: Lethargic/Drowsy;Requires cueing Oral Cavity Assessment: Within Functional Limits Oral Care Completed by SLP: No Oral Cavity - Dentition: Missing dentition;Poor condition Vision: Functional for self-feeding Self-Feeding Abilities: Total assist Patient Positioning: Upright in bed Baseline Vocal Quality: Not observed Volitional Cough: Cognitively unable to elicit Volitional Swallow: Unable to elicit    Oral/Motor/Sensory Function Overall Oral Motor/Sensory Function: Within functional limits   Ice Chips Ice chips: Not tested   Thin Liquid Thin Liquid: Impaired Presentation: Spoon;Straw;Cup Oral Phase Impairments: Reduced labial seal;Poor awareness of bolus Oral Phase Functional Implications: Right anterior spillage;Left anterior spillage;Oral holding Pharyngeal  Phase Impairments: Suspected delayed Swallow;Multiple swallows;Throat Clearing - Immediate;Wet Vocal Quality    Nectar Thick Nectar Thick Liquid: Not tested   Honey Thick Honey Thick Liquid: Not tested   Puree Puree: Impaired Presentation: Spoon Oral Phase Impairments: Reduced labial seal;Poor awareness of bolus Oral Phase Functional Implications: Oral holding;Oral residue Pharyngeal Phase Impairments: Suspected delayed Swallow;Multiple swallows;Throat Clearing - Immediate   Solid     Solid: Not tested      Damien Blumenthal, M.A., CCC-SLP Speech Language Pathology, Acute  Rehabilitation Services  Secure Chat preferred [phone],  09/21/2024,5:10 PM

## 2024-09-21 NOTE — Progress Notes (Signed)
Biliary drain dressing changed.

## 2024-09-21 NOTE — Progress Notes (Signed)
 [facility] NEPHROLOGY PROGRESS NOTE  Assessment/ Plan: Pt is a 89 y.o. yo male  with past medical history significant for dyslipidemia, thyroid disease, acid reflux, BPH with urinary retention who was initially presented to [facility] on 1/27 with generalized body pain, weakness, fever, abdominal pain, decreased oral intake, seen as a consultation for AKI.  # Acute kidney injury: Likely ischemic ATN in setting of septic shock.  Baseline creatinine normal.  Urine output remains very minimal.  Poor long-term dialysis candidate given his age.  Remains oliguric.   - She had help from IR with temporary dialysis catheter on 2/3 - Plan for dialysis today and again tomorrow -If the patient has not improved within a week will need to have further goals of care discussions with him and his sons.  Poor outpatient dialysis candidate. -Please avoid hypotensive episode, IV contrast or nephrotoxins.   # Septic shock due to E. coli bacteremia, off of pressor.  Antibiotics per primary team.     # Cholangitis status post percutaneous cholecystostomy with biliary drain.  ERCP attempted but unsuccessful. Transaminitis improving.   # Acute metabolic encephalopathy due to sepsis.  Seems improved at this time.   Subjective: No complaints today.  Planning for dialysis  Objective Vital signs in last 24 hours: Vitals:   09/21/24 0000 09/21/24 0432 09/21/24 0456 09/21/24 0753  BP: 124/60 (!) 120/103 116/72 (!) 112/59  Pulse: 77 93 85 74  Resp: 18 18  18   Temp: 98.3 F (36.8 C) (!) 96.2 F (35.7 C)  97.8 F (36.6 C)  TempSrc: Axillary Axillary  Axillary  SpO2: 100% 100% 100% 100%  Weight:      Height:       Weight change:   Intake/Output Summary (Last 24 hours) at 09/21/2024 1205 Last data filed at 09/21/2024 0504 Gross per 24 hour  Intake 240 ml  Output 765 ml  Net -525 ml       Labs: RENAL PANEL Recent Labs  Lab 09/14/24 1913 09/14/24 1920 09/15/24 0502 09/16/24 9341  09/17/24 0820 09/17/24 0856 09/18/24 0717 09/19/24 0417 09/20/24 0256 09/21/24 0412  NA 139   < > 135   < > 134*  --  135 135 134* 133*  K 4.2   < > 4.9   < > 4.4  --  4.7 5.0 5.0 5.1  CL 106  --  101   < > 98  --  97* 97* 97* 97*  CO2 18*  --  19*   < > 27  --  26 21* 23 21*  GLUCOSE 155*  --  147*   < > 88  --  65* 61* 91 93  BUN 29*  --  33*   < > 48*  --  56* 63* 70* 78*  CREATININE 1.51*  --  1.77*   < > 3.04*  --  3.69* 4.21* 4.83* 5.42*  CALCIUM  7.3*  --  7.3*   < > 7.0*  --  7.1* 7.1* 7.4* 7.5*  MG  --   --  2.3  --   --  1.9  --   --   --   --   PHOS 3.2  --   --   --  2.9  --  3.8 5.2*  --   --   ALBUMIN  2.4*  --  2.6*   < > 2.6*  --  2.2* 2.2*  2.2* 2.1* 2.2*   < > = values in this interval not displayed.  Liver Function Tests: Recent Labs  Lab 09/19/24 0417 09/20/24 0256 09/21/24 0412  AST 269* 168* 119*  ALT 267* 239* 207*  ALKPHOS 177* 133* 126  BILITOT 0.7 0.5 0.5  PROT 4.3* 4.3* 4.5*  ALBUMIN  2.2*  2.2* 2.1* 2.2*   No results for input(s): LIPASE, AMYLASE in the last 168 hours.  No results for input(s): AMMONIA in the last 168 hours. CBC: Recent Labs    09/17/24 0856 09/18/24 0717 09/19/24 0417 09/20/24 0256 09/21/24 0412  HGB 8.5* 9.4* 10.8* 10.8* 10.6*  MCV 86.5 85.9 86.7 89.6 90.1  VITAMINB12  --   --   --  >4,000*  --   FOLATE  --   --   --  11.1  --   FERRITIN  --   --   --  418*  --   TIBC  --   --   --  113*  --   IRON  --   --   --  58  --   RETICCTPCT  --   --   --  1.6  --     Cardiac Enzymes: No results for input(s): CKTOTAL, CKMB, CKMBINDEX, TROPONINI in the last 168 hours. CBG: Recent Labs  Lab 09/20/24 0009 09/20/24 0651 09/20/24 1328 09/20/24 1653 09/21/24 0040  GLUCAP 115* 87 99 92 85    Iron Studies:  Recent Labs    09/20/24 0256  IRON 58  TIBC 113*  FERRITIN 418*   Studies/Results: IR NON-TUNNELED CENTRAL VENOUS CATH PLC W IMG Result Date: 09/20/2024 INDICATION: ESRD requiring HD  initiation. EXAM: NON-TUNNELED CENTRAL VENOUS HEMODIALYSIS CATHETER PLACEMENT WITH ULTRASOUND AND FLUOROSCOPIC GUIDANCE COMPARISON:  Chest XR, 09/15/2024 MEDICATIONS: None FLUOROSCOPY: Radiation Exposure Index and estimated peak skin dose (PSD); Reference air kerma (RAK), 0.2 mGy. COMPLICATIONS: None immediate. PROCEDURE: Informed written consent was obtained from the patient and/or patient's representative after a discussion of the risks, benefits, and alternatives to treatment. Questions regarding the procedure were encouraged and answered. The RIGHT neck and chest were prepped with chlorhexidine  in a sterile fashion, and a sterile drape was applied covering the operative field. Maximum barrier sterile technique with sterile gowns and gloves were used for the procedure. A timeout was performed prior to the initiation of the procedure. After the overlying soft tissues were anesthetized, a small venotomy incision was created and a micropuncture kit was utilized to access the internal jugular vein. Real-time ultrasound guidance was utilized for vascular access including the acquisition of a permanent ultrasound image documenting patency of the accessed vessel. The microwire was utilized to measure appropriate catheter length. A stiff glidewire was advanced to the level of the IVC. Under fluoroscopic guidance, the venotomy was serially dilated, ultimately allowing placement of a 20 cm temporary Trialysis catheter with tip ultimately terminating within the superior aspect of the right atrium. Final catheter positioning was confirmed and documented with a spot radiographic image. The catheter aspirates and flushes normally. The catheter was flushed with appropriate volume heparin  dwells. The catheter exit site was secured with a 2-0 Ethilon retention suture. A dressing was placed. The patient tolerated the procedure well without immediate post procedural complication. IMPRESSION: Successful placement of a RIGHT internal  jugular approach 20 cm temporary dialysis catheter. The catheter is ready for immediate use. PLAN: This catheter may be converted to a tunneled dialysis catheter at a later date as indicated. Thom Hall, MD [facility] Radiology Electronically Signed   By: Thom Hall M.D.   On:  09/20/2024 11:44     Medications: Infusions:  cefTRIAXone  (ROCEPHIN )  IV 2 g (09/20/24 1241)   metronidazole  500 mg (09/21/24 0951)    Scheduled Medications:  Chlorhexidine  Gluconate Cloth  6 each Topical Daily   Chlorhexidine  Gluconate Cloth  6 each Topical Q0600   feeding supplement  1 Container Oral TID BM   levothyroxine   12.5 mcg Oral Q0600   melatonin  3 mg Oral QHS   midodrine   5 mg Oral TID WC   pantoprazole   40 mg Oral Daily   sodium chloride  flush  5 mL Intracatheter Q8H   sodium chloride  flush  5 mL Intracatheter Q8H   tamsulosin   0.4 mg Oral Daily   traZODone   50 mg Oral QHS    have reviewed scheduled and prn medications.  Physical Exam: General:NAD, comfortable, poor dentition Heart: Normal rate, no rub Lungs: Bilateral chest rise no increased work of breathing Abdomen:soft, Non-tender, non-distended Extremities:No edema Neurology: Alert and awake.  Judgment and insight seem fair  Sherra Player 09/21/2024,12:05 PM  LOS: 8 days

## 2024-09-21 NOTE — Plan of Care (Signed)
" °  Problem: Health Behavior/Discharge Planning: Goal: Ability to manage health-related needs will improve Outcome: Progressing   Problem: Clinical Measurements: Goal: Ability to maintain clinical measurements within normal limits will improve Outcome: Progressing Goal: Diagnostic test results will improve Outcome: Progressing Goal: Respiratory complications will improve Outcome: Progressing Goal: Cardiovascular complication will be avoided Outcome: Progressing   Problem: Coping: Goal: Level of anxiety will decrease Outcome: Progressing   Problem: Safety: Goal: Ability to remain free from injury will improve Outcome: Progressing   Problem: Skin Integrity: Goal: Risk for impaired skin integrity will decrease Outcome: Progressing   Problem: Respiratory: Goal: Ability to maintain adequate ventilation will improve Outcome: Progressing   "

## 2024-09-21 NOTE — Telephone Encounter (Signed)
 ERCP has been set up for 11/09/24 at 10 am at [facility] with GM

## 2024-09-21 NOTE — Care Management Important Message (Signed)
 Important Message  Patient Details  Name: Benjamin [name] MRN: [id number] Date of Birth: 1933/02/08   Important Message Given:  Yes - Medicare IM     Claretta Deed 09/21/2024, 4:14 PM

## 2024-09-21 NOTE — Procedures (Addendum)
 Interventional Radiology Procedure Note  Procedure: Biliary drain check  Findings: Please refer to procedural dictation for full description. Occlusion of distal aspect of drain, with persistent distal common bile duct obstruction, therefore current biliary drain functioning only as external drain.  Suspect transpleural drain course and moderate right pleural effusion, possibly bilious.  Complications: None immediate  Estimated Blood Loss:  < 5 mL  Recommendations: CT abdomen (non-contrast) ordered to assess drain position. IR will review CT and arrange for biliary drain check or possible new biliary drain placement + possible thoracentesis tomorrow with sedation.   Ester Sides, MD

## 2024-09-21 NOTE — TOC Progression Note (Signed)
 Transition of Care [facility]) - Progression Note    Patient Details  Name: KENWOOD ROSIAK MRN: [id number] Date of Birth: [birth year]  [facility] of [facility] CM/SW Contact  Inocente GORMAN Kindle, LCSW Phone Number: 09/21/2024, 9:03 AM  Clinical Narrative:    CSW continuing to follow for medical progression.    Expected Discharge Plan: Skilled Nursing Facility Barriers to Discharge: English As A Second Language Teacher, Continued Medical Work up, SNF Pending bed offer               Expected Discharge Plan and Services In-house Referral: Clinical Social Work   Post Acute Care Choice: Skilled Nursing Facility Living arrangements for the past 2 months: Single Family Home                                       Social Drivers of Health (SDOH) Interventions SDOH Screenings   Food Insecurity: No Food Insecurity (09/13/2024)  Housing: Low Risk (09/13/2024)  Transportation Needs: No Transportation Needs (09/13/2024)  Utilities: Not At Risk (09/13/2024)  Social Connections: Socially Integrated (09/13/2024)  Tobacco Use: Low Risk (09/15/2024)    Readmission Risk Interventions    09/14/2024   12:43 PM  Readmission Risk Prevention Plan  Transportation Screening Complete  PCP or Specialist Appt within 5-7 Days Complete  Home Care Screening Complete  Medication Review (RN CM) Complete

## 2024-09-21 NOTE — Progress Notes (Signed)
 "                        PROGRESS NOTE        PATIENT DETAILS Name: Benjamin [name] Age: 89 y.o. Sex: male Date of Birth: 1932/09/20 Admit Date: 09/13/2024 Admitting Physician Posey Maier, DO PCP:Pcp, No  Brief Summary: Patient is a 89 y.o.  male with history of BPH-presented to [facility] on 1/27 with sepsis due to acute cholecystitis/cholangitis/E. coli bacteremia-unfortunately postadmission-he developed septic shock and was transferred to [facility] and ICU.  Patient was then evaluated by GI/CCS/IR-underwent cholecystostomy tube placement-GI attempted ERCP but was unsuccessful-and subsequently required biliary drain placement by IR.  Unfortunately further hospital course was complicated by development of AKI  Significant events: 1/27>> admit to [facility] at [facility]-sepsis due to cholecystitis/cholangitis-post admission-progressed to septic shock 1/28>> transferred to ICU at [facility].  IR placed cholecystostomy tube. 1/29>> attempted ERCP by GI-unsuccessful due to duodenal diverticula-evaluated by IR-and underwent trans hepatic internal/external CBD drain placement. 1/30>> nephrology consulted for worsening AKI 2/2>> transferred to [facility] 2/3>> worsening AKI-oliguric-discussed with nephrology-keep n.p.o.-possible temporary HD catheter placement   Significant studies: 1/27>> CTA chest: No PE-large hiatal hernia. 1/27>> CT abdomen/pelvis: Acute cholecystitis-cholelithiasis with choledocholithiasis-intrahepatic/extrahepatic biliary ductal dilation- 1/28>> RUQ ultrasound: Distended gallbladder with stones/sludge. 1/30>> echo: EF 45%  Significant microbiology data: 1/27>> COVID/influenza/RSV PCR: Negative 1/27>> blood culture: E. coli 1/28>> bile culture: E. coli, Klebsiella pneumoniae, Bacteroides  Procedures: See above  Consults: IR CCS GI PCCM  Subjective: Tired and lethargic.  Denies any acute complaint.  Objective: Vitals: Blood pressure (!) 114/52, pulse 72, temperature 98.2 F (36.8 C),  temperature source Oral, resp. rate 14, height 5' 7 (1.702 m), weight 57.9 kg, SpO2 100%.   Exam: Basal crackles were Fatigue and tired. Oriented to self as well as person and place. No new focal deficit. Oral mucosa is dry.   Pertinent Labs/Radiology:    Latest Ref Rng & Units 09/21/2024    4:12 AM 09/20/2024    2:56 AM 09/19/2024    4:17 AM  CBC  WBC 4.0 - 10.5 K/uL 12.0  12.5  11.1   Hemoglobin 13.0 - 17.0 g/dL 89.3  89.1  89.1   Hematocrit 39.0 - 52.0 % 32.7  32.6  31.9   Platelets 150 - 400 K/uL 106  93  76     Lab Results  Component Value Date   NA 133 (L) 09/21/2024   K 5.1 09/21/2024   CL 97 (L) 09/21/2024   CO2 21 (L) 09/21/2024      Assessment/Plan: Septic shock secondary to E. coli bacteremia due to acute calculous cholecystitis-acute cholangitis-in the setting of choledocholithiasis Sepsis physiology rapidly improving S/p cholecystostomy and drain on 1/28-and s/p internal/external biliary drain on 1/29 (ERCP unsuccessful due to duodenal diverticulum) Remains on IV Rocephin /Flagyl  General surgery has signed off 2/2-Will need outpatient follow-up with general surgery for elective cholecystectomy IR following for drain care. Scheduled for repeat procedure tomorrow on 2/12.  AKI Multifactorial-likely ischemic ATN and contrast-induced nephropathy Remains oliguric-worsening renal function Discussed with nephrology-Dr. Peoples-initiating hemodialysis.  Monitor for improvement. Avoid nephrotoxic agents.  Thrombocytopenia Likely secondary to sepsis/gram-negative bacteremia Follow CBC periodically  Normocytic anemia Secondary to critical illness/worsening AKI Follow CBC-transfuse if significant drop No evidence of GI bleeding.  PAF with RVR Acute in the ICU Maintaining sinus rhythm Echo with slightly suppressed EF TSH elevated (19.9) Anticoagulation not started-due to thrombocytopenia-patient needing procedure/instrumentation-kept n.p.o. today for possible  dialysis catheter placement. Watch closely-if thrombocytopenia  continues to improve-and no further procedures are needed-we can consider initiating anticoagulation of the next several days.  Transaminitis Secondary to cholecystitis/cholangitis-possible shock liver Downtrending Follow LFTs.  History of BPH Currently has Foley catheter in place Continue Flomax  Voiding well when he is a bit more stable.  History of hypothyroidism Per son-patient has been noncompliant with thyroid medications-unclear how long he has not been taking Synthroid  Since TSH close to 20-and due to prior documented history of hypothyroidism-will start him on very low-dose Synthroid  and see how he does. Repeat thyroid function in 6 weeks   Debility/deconditioning Secondary to acute/critical illness. PT/OT eval-SNF recommended  Goals of care conversation. Will place palliative care consult. Prognosis is poor especially in the setting of poor p.o. intake.  Dysphagia. SLP reconsulted. I think this is related to his worsening uremia and anticipate improvement with his dialysis but currently unable to swallow safely and therefore remains NPO.  Underweight: Estimated body mass index is 19.99 kg/m as calculated from the following:   Height as of this encounter: 5' 7 (1.702 m).   Weight as of this encounter: 57.9 kg.   Code status:   Code Status: Full Code   DVT Prophylaxis: SCDs Start: 09/14/24 0007   Family Communication: Discussed with daughter-in-law at bedside.  Disposition Plan: Status is: Inpatient Remains inpatient appropriate because: Severity of illness   Planned Discharge Destination:Skilled nursing facility   Diet: Diet Order             Diet NPO time specified Except for: Sips with Meds  Diet effective midnight           Diet NPO time specified Except for: Ice Chips  Diet effective now                     Antimicrobial agents: Anti-infectives (From admission, onward)     Start     Dose/Rate Route Frequency Ordered Stop   09/16/24 2200  metroNIDAZOLE  (FLAGYL ) IVPB 500 mg        500 mg 100 mL/hr over 60 Minutes Intravenous Every 12 hours 09/16/24 1330     09/15/24 1615  cefOXitin  (MEFOXIN ) 2 g in sodium chloride  0.9 % 100 mL IVPB  Status:  Discontinued       Note to Pharmacy: TO IR   2 g 200 mL/hr over 30 Minutes Intravenous To Radiology 09/15/24 1525 09/16/24 1226   09/15/24 1300  cefTRIAXone  (ROCEPHIN ) 2 g in sodium chloride  0.9 % 100 mL IVPB        2 g 200 mL/hr over 30 Minutes Intravenous Every 24 hours 09/15/24 0852     09/15/24 1000  metroNIDAZOLE  (FLAGYL ) IVPB 500 mg  Status:  Discontinued        500 mg 100 mL/hr over 60 Minutes Intravenous 2 times daily 09/15/24 0852 09/16/24 0958   09/14/24 0600  piperacillin -tazobactam (ZOSYN ) IVPB 3.375 g  Status:  Discontinued        3.375 g 12.5 mL/hr over 240 Minutes Intravenous Every 8 hours 09/14/24 0250 09/15/24 0852   09/13/24 2345  piperacillin -tazobactam (ZOSYN ) IVPB 3.375 g        3.375 g 100 mL/hr over 30 Minutes Intravenous  Once 09/13/24 2248 09/14/24 0043        MEDICATIONS: Scheduled Meds:  Chlorhexidine  Gluconate Cloth  6 each Topical Daily   Chlorhexidine  Gluconate Cloth  6 each Topical Q0600   feeding supplement  1 Container Oral TID BM   levothyroxine   12.5 mcg Intravenous Daily  melatonin  3 mg Oral QHS   midodrine   5 mg Oral TID WC   pantoprazole   40 mg Oral Daily   sodium chloride  flush  5 mL Intracatheter Q8H   sodium chloride  flush  5 mL Intracatheter Q8H   tamsulosin   0.4 mg Oral Daily   traZODone   50 mg Oral QHS   Continuous Infusions:  cefTRIAXone  (ROCEPHIN )  IV 2 g (09/21/24 1259)   metronidazole  500 mg (09/21/24 0951)   PRN Meds:.acetaminophen  **OR** acetaminophen , loperamide , ondansetron  **OR** ondansetron  (ZOFRAN ) IV, prochlorperazine , sodium chloride  flush   I have personally reviewed following labs and imaging studies  LABORATORY DATA: CBC: Recent Labs  Lab  09/17/24 0856 09/18/24 0717 09/19/24 0417 09/20/24 0256 09/21/24 0412  WBC 10.9* 9.4 11.1* 12.5* 12.0*  NEUTROABS 9.6* 7.7  --   --   --   HGB 8.5* 9.4* 10.8* 10.8* 10.6*  HCT 24.9* 27.5* 31.9* 32.6* 32.7*  MCV 86.5 85.9 86.7 89.6 90.1  PLT 85* 75* 76* 93* 106*    Basic Metabolic Panel: Recent Labs  Lab 09/14/24 1913 09/14/24 1920 09/15/24 0502 09/16/24 9341 09/17/24 0820 09/17/24 0856 09/18/24 0717 09/19/24 0417 09/20/24 0256 09/21/24 0412  NA 139   < > 135   < > 134*  --  135 135 134* 133*  K 4.2   < > 4.9   < > 4.4  --  4.7 5.0 5.0 5.1  CL 106  --  101   < > 98  --  97* 97* 97* 97*  CO2 18*  --  19*   < > 27  --  26 21* 23 21*  GLUCOSE 155*  --  147*   < > 88  --  65* 61* 91 93  BUN 29*  --  33*   < > 48*  --  56* 63* 70* 78*  CREATININE 1.51*  --  1.77*   < > 3.04*  --  3.69* 4.21* 4.83* 5.42*  CALCIUM  7.3*  --  7.3*   < > 7.0*  --  7.1* 7.1* 7.4* 7.5*  MG  --   --  2.3  --   --  1.9  --   --   --   --   PHOS 3.2  --   --   --  2.9  --  3.8 5.2*  --   --    < > = values in this interval not displayed.    GFR: Estimated Creatinine Clearance: 7.6 mL/min (A) (by C-G formula based on SCr of 5.42 mg/dL (H)).  Liver Function Tests: Recent Labs  Lab 09/15/24 0502 09/16/24 9341 09/17/24 0820 09/18/24 0717 09/19/24 0417 09/20/24 0256 09/21/24 0412  AST 1,409* 906*  --   --  269* 168* 119*  ALT 779* 491*  --   --  267* 239* 207*  ALKPHOS 412* 249*  --   --  177* 133* 126  BILITOT 1.5* 0.9  --   --  0.7 0.5 0.5  PROT 5.3* 4.5*  --   --  4.3* 4.3* 4.5*  ALBUMIN  2.6* 1.9* 2.6* 2.2* 2.2*  2.2* 2.1* 2.2*   No results for input(s): LIPASE, AMYLASE in the last 168 hours.  No results for input(s): AMMONIA in the last 168 hours.  Coagulation Profile: Recent Labs  Lab 09/15/24 0502  INR 1.5*    Cardiac Enzymes: No results for input(s): CKTOTAL, CKMB, CKMBINDEX, TROPONINI in the last 168 hours.  BNP (last 3 results) Recent Labs  09/13/24 1756  PROBNP 778.0*    Lipid Profile: No results for input(s): CHOL, HDL, LDLCALC, TRIG, CHOLHDL, LDLDIRECT in the last 72 hours.  Thyroid Function Tests: Recent Labs    09/20/24 0256  TSH 19.900*  T3FREE 1.7*    Anemia Panel: Recent Labs    09/20/24 0256  VITAMINB12 >4,000*  FOLATE 11.1  FERRITIN 418*  TIBC 113*  IRON 58  RETICCTPCT 1.6    Urine analysis:    Component Value Date/Time   COLORURINE AMBER (A) 09/14/2024 0450   APPEARANCEUR HAZY (A) 09/14/2024 0450   APPEARANCEUR Cloudy (A) 08/28/2022 1517   LABSPEC 1.034 (H) 09/14/2024 0450   PHURINE 5.0 09/14/2024 0450   GLUCOSEU NEGATIVE 09/14/2024 0450   HGBUR NEGATIVE 09/14/2024 0450   HGBUR large 07/18/2008 1427   BILIRUBINUR NEGATIVE 09/14/2024 0450   BILIRUBINUR Negative 08/28/2022 1517   KETONESUR 5 (A) 09/14/2024 0450   PROTEINUR 30 (A) 09/14/2024 0450   UROBILINOGEN 0.2 01/04/2015 1722   NITRITE NEGATIVE 09/14/2024 0450   LEUKOCYTESUR MODERATE (A) 09/14/2024 0450    Sepsis Labs: Lactic Acid, Venous    Component Value Date/Time   LATICACIDVEN 2.9 (HH) 09/16/2024 1320    MICROBIOLOGY: Recent Results (from the past 240 hours)  Resp panel by RT-PCR (RSV, Flu A&B, Covid) Anterior Nasal Swab     Status: None   Collection Time: 09/13/24  5:51 PM   Specimen: Anterior Nasal Swab  Result Value Ref Range Status   SARS Coronavirus 2 by RT PCR NEGATIVE NEGATIVE Final    Comment: (NOTE) SARS-CoV-2 target nucleic acids are NOT DETECTED.  The SARS-CoV-2 RNA is generally detectable in upper respiratory specimens during the acute phase of infection. The lowest concentration of SARS-CoV-2 viral copies this assay can detect is 138 copies/mL. A negative result does not preclude SARS-Cov-2 infection and should not be used as the sole basis for treatment or other patient management decisions. A negative result may occur with  improper specimen collection/handling, submission of specimen  other than nasopharyngeal swab, presence of viral mutation(s) within the areas targeted by this assay, and inadequate number of viral copies(<138 copies/mL). A negative result must be combined with clinical observations, patient history, and epidemiological information. The expected result is Negative.  Fact Sheet for Patients:  bloggercourse.com  Fact Sheet for Healthcare Providers:  seriousbroker.it  This test is no t yet approved or cleared by the [place]  FDA and  has been authorized for detection and/or diagnosis of SARS-CoV-2 by FDA under an Emergency Use Authorization (EUA). This EUA will remain  in effect (meaning this test can be used) for the duration of the COVID-19 declaration under Section 564(b)(1) of the Act, 21 U.S.C.section 360bbb-3(b)(1), unless the authorization is terminated  or revoked sooner.       Influenza A by PCR NEGATIVE NEGATIVE Final   Influenza B by PCR NEGATIVE NEGATIVE Final    Comment: (NOTE) The Xpert Xpress SARS-CoV-2/FLU/RSV plus assay is intended as an aid in the diagnosis of influenza from Nasopharyngeal swab specimens and should not be used as a sole basis for treatment. Nasal washings and aspirates are unacceptable for Xpert Xpress SARS-CoV-2/FLU/RSV testing.  Fact Sheet for Patients: bloggercourse.com  Fact Sheet for Healthcare Providers: seriousbroker.it  This test is not yet approved or cleared by the [place]  FDA and has been authorized for detection and/or diagnosis of SARS-CoV-2 by FDA under an Emergency Use Authorization (EUA). This EUA will remain in effect (meaning this test can be used) for the duration of  the COVID-19 declaration under Section 564(b)(1) of the Act, 21 U.S.C. section 360bbb-3(b)(1), unless the authorization is terminated or revoked.     Resp Syncytial Virus by PCR NEGATIVE NEGATIVE Final     Comment: (NOTE) Fact Sheet for Patients: bloggercourse.com  Fact Sheet for Healthcare Providers: seriousbroker.it  This test is not yet approved or cleared by the [place]  FDA and has been authorized for detection and/or diagnosis of SARS-CoV-2 by FDA under an Emergency Use Authorization (EUA). This EUA will remain in effect (meaning this test can be used) for the duration of the COVID-19 declaration under Section 564(b)(1) of the Act, 21 U.S.C. section 360bbb-3(b)(1), unless the authorization is terminated or revoked.  Performed at [facility], [address], [place], [place] [address]   Culture, blood (routine x 2)     Status: Abnormal   Collection Time: 09/13/24  6:13 PM   Specimen: BLOOD  Result Value Ref Range Status   Specimen Description   Final    BLOOD BLOOD LEFT ARM Performed at [facility], [address], [place], [place] [address]    Special Requests   Final    BOTTLES DRAWN AEROBIC AND ANAEROBIC Blood Culture adequate volume Performed at [facility], [address], [place], [place] [address]    Culture  Setup Time   Final    AEROBIC BOTTLE ONLY GRAM NEGATIVE RODS Gram Stain Report Called to,Read Back By and Verified WithBETHA JANELL GOSLING @ [phone] ON 09/14/24 JAYSON BUCK Performed at [facility], [address] [place], [place] [address]    Culture (A)  Final    ESCHERICHIA COLI SUSCEPTIBILITIES PERFORMED ON PREVIOUS CULTURE WITHIN THE LAST 5 DAYS. Performed at [facility], [address], [place], [place] [address]    Report Status 09/16/2024 FINAL  Final  Culture, blood (routine x 2)     Status: Abnormal   Collection Time: 09/13/24  6:13 PM   Specimen: BLOOD  Result Value Ref Range Status   Specimen Description   Final    BLOOD LEFT ANTECUBITAL Performed at [facility], [address], [place], [place] [address]    Special Requests   Final    BOTTLES DRAWN AEROBIC AND ANAEROBIC Blood Culture  adequate volume Performed at [facility], [address], [place], [place] [address]    Culture  Setup Time   Final    IN BOTH AEROBIC AND ANAEROBIC BOTTLES GRAM NEGATIVE RODS Gram Stain Report Called to,Read Back By and Verified With: KATELYN ROGERS @ [phone] ON 09/14/24 C VARNER CRITICAL RESULT CALLED TO, READ BACK BY AND VERIFIED WITH: PHARMD Elspeth Sour on [id number] @1300  by SM Performed at [facility], [address] [place], [place] [address]    Culture ESCHERICHIA COLI (A)  Final   Report Status 09/16/2024 FINAL  Final   Organism ID, Bacteria ESCHERICHIA COLI  Final      Susceptibility   Escherichia coli - MIC*    AMPICILLIN >=32 RESISTANT Resistant     CEFAZOLIN (NON-URINE) 8 RESISTANT Resistant     CEFEPIME <=0.12 SENSITIVE Sensitive     ERTAPENEM <=0.12 SENSITIVE Sensitive     CEFTRIAXONE  <=0.25 SENSITIVE Sensitive     CIPROFLOXACIN  >=4 RESISTANT Resistant     GENTAMICIN >=16 RESISTANT Resistant     MEROPENEM <=0.25 SENSITIVE Sensitive     TRIMETH /SULFA  <=20 SENSITIVE Sensitive     AMPICILLIN/SULBACTAM >=32 RESISTANT Resistant     PIP/TAZO Value in next row Intermediate      32  INTERMEDIATEThis is a modified FDA-approved test that has been validated and its performance characteristics determined by the reporting laboratory.  This laboratory is certified under the Clinical Laboratory Improvement Amendments CLIA as qualified to perform high complexity clinical laboratory testing.    * ESCHERICHIA COLI  Blood Culture ID Panel (Reflexed)     Status: Abnormal   Collection Time: 09/13/24  6:13 PM  Result Value Ref Range Status   Enterococcus faecalis NOT DETECTED NOT DETECTED Final   Enterococcus Faecium NOT DETECTED NOT DETECTED Final   Listeria monocytogenes NOT DETECTED NOT DETECTED Final   Staphylococcus species NOT DETECTED NOT DETECTED Final   Staphylococcus aureus (BCID) NOT DETECTED NOT DETECTED Final   Staphylococcus epidermidis NOT DETECTED NOT DETECTED Final    Staphylococcus lugdunensis NOT DETECTED NOT DETECTED Final   Streptococcus species NOT DETECTED NOT DETECTED Final   Streptococcus agalactiae NOT DETECTED NOT DETECTED Final   Streptococcus pneumoniae NOT DETECTED NOT DETECTED Final   Streptococcus pyogenes NOT DETECTED NOT DETECTED Final   A.calcoaceticus-baumannii NOT DETECTED NOT DETECTED Final   Bacteroides fragilis NOT DETECTED NOT DETECTED Final   Enterobacterales DETECTED (A) NOT DETECTED Final    Comment: Enterobacterales represent a large order of gram negative bacteria, not a single organism. CRITICAL RESULT CALLED TO, READ BACK BY AND VERIFIED WITH: PHARMD Elspeth Sour on [id number] @1300  by SM    Enterobacter cloacae complex NOT DETECTED NOT DETECTED Final   Escherichia coli DETECTED (A) NOT DETECTED Final    Comment: CRITICAL RESULT CALLED TO, READ BACK BY AND VERIFIED WITH: PHARMD Elspeth Sour on [id number] @1300  by SM    Klebsiella aerogenes NOT DETECTED NOT DETECTED Final   Klebsiella oxytoca NOT DETECTED NOT DETECTED Final   Klebsiella pneumoniae NOT DETECTED NOT DETECTED Final   Proteus species NOT DETECTED NOT DETECTED Final   Salmonella species NOT DETECTED NOT DETECTED Final   Serratia marcescens NOT DETECTED NOT DETECTED Final   Haemophilus influenzae NOT DETECTED NOT DETECTED Final   Neisseria meningitidis NOT DETECTED NOT DETECTED Final   Pseudomonas aeruginosa NOT DETECTED NOT DETECTED Final   Stenotrophomonas maltophilia NOT DETECTED NOT DETECTED Final   Candida albicans NOT DETECTED NOT DETECTED Final   Candida auris NOT DETECTED NOT DETECTED Final   Candida glabrata NOT DETECTED NOT DETECTED Final   Candida krusei NOT DETECTED NOT DETECTED Final   Candida parapsilosis NOT DETECTED NOT DETECTED Final   Candida tropicalis NOT DETECTED NOT DETECTED Final   Cryptococcus neoformans/gattii NOT DETECTED NOT DETECTED Final   CTX-M ESBL NOT DETECTED NOT DETECTED Final   Carbapenem resistance IMP NOT DETECTED NOT DETECTED  Final   Carbapenem resistance KPC NOT DETECTED NOT DETECTED Final   Carbapenem resistance NDM NOT DETECTED NOT DETECTED Final   Carbapenem resist OXA 48 LIKE NOT DETECTED NOT DETECTED Final   Carbapenem resistance VIM NOT DETECTED NOT DETECTED Final    Comment: Performed at [facility], [address] [place], [place] [address]  MRSA Next Gen by PCR, Nasal     Status: None   Collection Time: 09/14/24  4:07 AM   Specimen: Nasal Mucosa; Nasal Swab  Result Value Ref Range Status   MRSA by PCR Next Gen NOT DETECTED NOT DETECTED Final    Comment: (NOTE) The GeneXpert MRSA Assay (FDA approved for NASAL specimens only), is one component of a comprehensive MRSA colonization surveillance program. It is not intended to diagnose MRSA infection nor to guide or monitor treatment for MRSA infections. Test performance is not  FDA approved in patients less than 55 years old. Performed at [facility], [address] [place], [place] [address]   MRSA Next Gen by PCR, Nasal     Status: None   Collection Time: 09/14/24  5:09 PM   Specimen: Nasal Mucosa; Nasal Swab  Result Value Ref Range Status   MRSA by PCR Next Gen NOT DETECTED NOT DETECTED Final    Comment: (NOTE) The GeneXpert MRSA Assay (FDA approved for NASAL specimens only), is one component of a comprehensive MRSA colonization surveillance program. It is not intended to diagnose MRSA infection nor to guide or monitor treatment for MRSA infections. Test performance is not FDA approved in patients less than 64 years old. Performed at [facility], [address], [place], [place] [address]   Aerobic/Anaerobic Culture w Gram Stain (surgical/deep wound)     Status: None   Collection Time: 09/14/24  5:33 PM   Specimen: BILE  Result Value Ref Range Status   Specimen Description BILE  Final   Special Requests NONE  Final   Gram Stain   Final    ABUNDANT WBC PRESENT, PREDOMINANTLY PMN MODERATE GRAM NEGATIVE RODS FEW GRAM POSITIVE  CO[facility]I    Culture   Final    ABUNDANT ESCHERICHIA COLI MODERATE KLEBSIELLA PNEUMONIAE MODERATE BACTEROIDES FRAGILIS BETA LACTAMASE POSITIVE Performed at [facility], [address], [place], [place] [address]    Report Status 09/17/2024 FINAL  Final   Organism ID, Bacteria ESCHERICHIA COLI  Final   Organism ID, Bacteria KLEBSIELLA PNEUMONIAE  Final      Susceptibility   Escherichia coli - MIC*    AMPICILLIN >=32 RESISTANT Resistant     CEFAZOLIN (NON-URINE) 16 RESISTANT Resistant     CEFEPIME <=0.12 SENSITIVE Sensitive     ERTAPENEM <=0.12 SENSITIVE Sensitive     CEFTRIAXONE  <=0.25 SENSITIVE Sensitive     CIPROFLOXACIN  >=4 RESISTANT Resistant     GENTAMICIN >=16 RESISTANT Resistant     MEROPENEM <=0.25 SENSITIVE Sensitive     TRIMETH /SULFA  <=20 SENSITIVE Sensitive     AMPICILLIN/SULBACTAM >=32 RESISTANT Resistant     PIP/TAZO Value in next row Intermediate      64 INTERMEDIATEThis is a modified FDA-approved test that has been validated and its performance characteristics determined by the reporting laboratory.  This laboratory is certified under the Clinical Laboratory Improvement Amendments CLIA as qualified to perform high complexity clinical laboratory testing.    * ABUNDANT ESCHERICHIA COLI   Klebsiella pneumoniae - MIC*    AMPICILLIN Value in next row Resistant      64 INTERMEDIATEThis is a modified FDA-approved test that has been validated and its performance characteristics determined by the reporting laboratory.  This laboratory is certified under the Clinical Laboratory Improvement Amendments CLIA as qualified to perform high complexity clinical laboratory testing.    CEFAZOLIN (NON-URINE) Value in next row Sensitive      64 INTERMEDIATEThis is a modified FDA-approved test that has been validated and its performance characteristics determined by the reporting laboratory.  This laboratory is certified under the Clinical Laboratory Improvement Amendments CLIA as qualified  to perform high complexity clinical laboratory testing.    CEFEPIME Value in next row Sensitive      64 INTERMEDIATEThis is a modified FDA-approved test that has been validated and its performance characteristics determined by the reporting laboratory.  This laboratory is certified under the Clinical Laboratory Improvement Amendments CLIA as qualified to perform high complexity clinical laboratory testing.    ERTAPENEM  Value in next row Sensitive      64 INTERMEDIATEThis is a modified FDA-approved test that has been validated and its performance characteristics determined by the reporting laboratory.  This laboratory is certified under the Clinical Laboratory Improvement Amendments CLIA as qualified to perform high complexity clinical laboratory testing.    CEFTRIAXONE  Value in next row Sensitive      64 INTERMEDIATEThis is a modified FDA-approved test that has been validated and its performance characteristics determined by the reporting laboratory.  This laboratory is certified under the Clinical Laboratory Improvement Amendments CLIA as qualified to perform high complexity clinical laboratory testing.    CIPROFLOXACIN  Value in next row Sensitive      64 INTERMEDIATEThis is a modified FDA-approved test that has been validated and its performance characteristics determined by the reporting laboratory.  This laboratory is certified under the Clinical Laboratory Improvement Amendments CLIA as qualified to perform high complexity clinical laboratory testing.    GENTAMICIN Value in next row Sensitive      64 INTERMEDIATEThis is a modified FDA-approved test that has been validated and its performance characteristics determined by the reporting laboratory.  This laboratory is certified under the Clinical Laboratory Improvement Amendments CLIA as qualified to perform high complexity clinical laboratory testing.    MEROPENEM Value in next row Sensitive      64 INTERMEDIATEThis is a modified FDA-approved test  that has been validated and its performance characteristics determined by the reporting laboratory.  This laboratory is certified under the Clinical Laboratory Improvement Amendments CLIA as qualified to perform high complexity clinical laboratory testing.    TRIMETH /SULFA  Value in next row Sensitive      64 INTERMEDIATEThis is a modified FDA-approved test that has been validated and its performance characteristics determined by the reporting laboratory.  This laboratory is certified under the Clinical Laboratory Improvement Amendments CLIA as qualified to perform high complexity clinical laboratory testing.    AMPICILLIN/SULBACTAM Value in next row Sensitive      64 INTERMEDIATEThis is a modified FDA-approved test that has been validated and its performance characteristics determined by the reporting laboratory.  This laboratory is certified under the Clinical Laboratory Improvement Amendments CLIA as qualified to perform high complexity clinical laboratory testing.    PIP/TAZO Value in next row Sensitive      <=4 SENSITIVEThis is a modified FDA-approved test that has been validated and its performance characteristics determined by the reporting laboratory.  This laboratory is certified under the Clinical Laboratory Improvement Amendments CLIA as qualified to perform high complexity clinical laboratory testing.    * MODERATE KLEBSIELLA PNEUMONIAE    RADIOLOGY STUDIES/RESULTS: IR CHOLANGIOGRAM EXISTING TUBE Result Date: 09/21/2024 CLINICAL DATA:  89 year old male with history of biliary obstruction status post internal external biliary drain placement on 09/15/2024. EXAM: Biliary drain check COMPARISON:  09/15/2024 CONTRAST:  10 mL Omnipaque  300-administered via the existing percutaneous drain. FLUOROSCOPY TIME:  Five mGy reference air kerma TECHNIQUE: The patient was positioned supine on the fluoroscopy table. A preprocedural spot fluoroscopic image was obtained of the right upper quadrant and the  existing percutaneous drainage catheter. Multiple spot fluoroscopic and radiographic images were obtained following the injection of a small amount of contrast via the existing percutaneous drainage catheter. The drain was placed back to bag drainage. Sterile bandage was applied. FINDINGS: Suspected trans pleural peripheral coarse of indwelling drain. The pigtail portion appears to be within the second portion of the duodenum. Contrast injection demonstrates patency of the peripheral aspect of the tube without passage  of contrast beyond the pigtail portion. There is persistent mild dilation of the common bile duct which appears occluded at the ampulla. Interval development of moderate right pleural effusion. IMPRESSION: Suspected transpleural transgression of indwelling right-sided internal external biliary drainage catheter which is occluded distally. PLAN: Obtain CT abdomen to delineate exact course of indwelling drain. Interventional Radiology will arrange for biliary drain exchange versus new placement tomorrow. Ester Sides, MD [facility] Radiology Electronically Signed   By: Ester Sides M.D.   On: 09/21/2024 16:19   IR NON-TUNNELED CENTRAL VENOUS CATH [facility] W IMG Result Date: 09/20/2024 INDICATION: ESRD requiring HD initiation. EXAM: NON-TUNNELED CENTRAL VENOUS HEMODIALYSIS CATHETER PLACEMENT WITH ULTRASOUND AND FLUOROSCOPIC GUIDANCE COMPARISON:  Chest XR, 09/15/2024 MEDICATIONS: None FLUOROSCOPY: Radiation Exposure Index and estimated peak skin dose (PSD); Reference air kerma (RAK), 0.2 mGy. COMPLICATIONS: None immediate. PROCEDURE: Informed written consent was obtained from the patient and/or patient's representative after a discussion of the risks, benefits, and alternatives to treatment. Questions regarding the procedure were encouraged and answered. The RIGHT neck and chest were prepped with chlorhexidine  in a sterile fashion, and a sterile drape was  applied covering the operative field. Maximum barrier sterile technique with sterile gowns and gloves were used for the procedure. A timeout was performed prior to the initiation of the procedure. After the overlying soft tissues were anesthetized, a small venotomy incision was created and a micropuncture kit was utilized to access the internal jugular vein. Real-time ultrasound guidance was utilized for vascular access including the acquisition of a permanent ultrasound image documenting patency of the accessed vessel. The microwire was utilized to measure appropriate catheter length. A stiff glidewire was advanced to the level of the IVC. Under fluoroscopic guidance, the venotomy was serially dilated, ultimately allowing placement of a 20 cm temporary Trialysis catheter with tip ultimately terminating within the superior aspect of the right atrium. Final catheter positioning was confirmed and documented with a spot radiographic image. The catheter aspirates and flushes normally. The catheter was flushed with appropriate volume heparin  dwells. The catheter exit site was secured with a 2-0 Ethilon retention suture. A dressing was placed. The patient tolerated the procedure well without immediate post procedural complication. IMPRESSION: Successful placement of a RIGHT internal jugular approach 20 cm temporary dialysis catheter. The catheter is ready for immediate use. PLAN: This catheter may be converted to a tunneled dialysis catheter at a later date as indicated. Thom Hall, MD [facility] Radiology Electronically Signed   By: Thom Hall M.D.   On: 09/20/2024 11:44     LOS: 8 days   Yetta Blanch, MD  [facility]    To contact the attending provider between 7A-7P or the covering provider during after hours 7P-7A, please log into the web site [email or web address] and access using universal [place] password for that web site. If you do not have the  password, please call the hospital operator.  09/21/2024, 5:26 PM    "

## 2024-09-21 NOTE — Telephone Encounter (Signed)
 Note [name], [name], MD  [name], RN; [name], [name], PA-C; [name], [name], This patient needs a follow-up with me (not APP) in 3 to 4 weeks.  Okay to use overbook or held slot if necessary.   Appt made for 10/21/24 at 350 pm with GM- [name] to notify pt.   [name], This patient needs an ERCP scheduled in 6 to 7 weeks (ideally the patient is scheduled for 3rd or 4th patient of the day so that we can have IR perform their portion of procedure before). Thanks. GM

## 2024-09-21 NOTE — Telephone Encounter (Signed)
 The pt is currently admitted all information to be mailed, sent to My Chart and provided at discharge.    10/21/24 appt with Dr Wilhelmenia at 350 pm.

## 2024-09-21 NOTE — Progress Notes (Signed)
 Physical Therapy Treatment Patient Details Name: Benjamin [name] MRN: [id number] DOB: 1933/07/18 Today's Date: 09/21/2024   History of Present Illness Pt is a 89 y/o male presenting on 1/26 due to 2 week flu like symptoms, weakness, and decreased oral intake. CT with cholelithiasis and choledocholithiasis, suspicion for acute cholecystitis. CT angio negative PE but showed large iatal hernia. 09/14/24 transferred to [facility], s/p cholecystotomy tube placement. 1/29 s/p ERCP, per radiology failed but placed drainage cath.  PMH includes: thyroid disease, urinary retention, prior back surgery.    PT Comments  Pt received in supine and initially lethargic requiring increased stimulation and cues for participation. Pt demonstrates decreased initiation this session requiring total A +2 for bed mobility. Pt intermittently requiring up to mod A for sitting balance, but able to achieve periods of CGA. Pt able to tolerate increased time sitting EOB for dressing change, but visibly fatigued and requiring increased assist towards end of trial. Pt noted to be incontinent of bowel and requires assist for pericare and linen change. Pt continues to benefit from PT services to progress toward functional mobility goals.    If plan is discharge home, recommend the following: Two people to help with walking and/or transfers;Two people to help with bathing/dressing/bathroom   Can travel by private vehicle     No  Equipment Recommendations  Other (comment) (TBD)    Recommendations for Other Services       Precautions / Restrictions Precautions Precautions: Fall;Other (comment) Recall of Precautions/Restrictions: Impaired Precaution/Restrictions Comments: 2 drains R abd, foley Restrictions Weight Bearing Restrictions Per Provider Order: No     Mobility  Bed Mobility Overal bed mobility: Needs Assistance Bed Mobility: Rolling, Sidelying to Sit, Sit to Sidelying Rolling: Max assist, +2 for physical  assistance Sidelying to sit: Total assist, +2 for physical assistance Supine to sit: Total assist, +2 for physical assistance, HOB elevated     General bed mobility comments: Little initiation from pt despite max cues requiring total A +2 for all aspects    Transfers                   General transfer comment: did not attempt due to quick fatigue and weakness    Ambulation/Gait                   Stairs             Wheelchair Mobility     Tilt Bed    Modified Rankin (Stroke Patients Only)       Balance Overall balance assessment: Needs assistance Sitting-balance support: Single extremity supported, Feet supported, Bilateral upper extremity supported Sitting balance-Leahy Scale: Poor Sitting balance - Comments: Mod A progressing to periods of CGA sitting EOB.                                    Communication Communication Communication: Impaired Factors Affecting Communication: Difficulty expressing self;Reduced clarity of speech  Cognition Arousal: Alert Behavior During Therapy: Flat affect   PT - Cognitive impairments: Memory, Sequencing, Problem solving, Orientation, Attention, Awareness   Orientation impairments: Place, Time, Situation                     Following commands: Impaired Following commands impaired: Follows one step commands with increased time, Follows one step commands inconsistently    Cueing Cueing Techniques: Verbal cues, Tactile cues, Visual cues  Exercises  General Comments General comments (skin integrity, edema, etc.): SpO2 100% on [place] upon entry and remaining WFL on RA during session. Reading 88% at end of session in flat bed so [place] donned, but poor pleth.      Pertinent Vitals/Pain Pain Assessment Pain Assessment: Faces Faces Pain Scale: Hurts little more Pain Location: generalized with mobility, pt doesn't specify location Pain Descriptors / Indicators: Discomfort Pain  Intervention(s): Limited activity within patient's tolerance, Monitored during session     PT Goals (current goals can now be found in the care plan section) Acute Rehab PT Goals Patient Stated Goal: get stronger PT Goal Formulation: With patient Time For Goal Achievement: 09/30/24 Progress towards PT goals: Not progressing toward goals - comment (limited by weakness and fatigue)    Frequency    Min 2X/week      PT Plan      Co-evaluation PT/OT/SLP Co-Evaluation/Treatment: Yes Reason for Co-Treatment: Complexity of the patient's impairments (multi-system involvement);To address functional/ADL transfers;For patient/therapist safety PT goals addressed during session: Mobility/safety with mobility;Balance        AM-PAC PT 6 Clicks Mobility   Outcome Measure  Help needed turning from your back to your side while in a flat bed without using bedrails?: Total Help needed moving from lying on your back to sitting on the side of a flat bed without using bedrails?: Total Help needed moving to and from a bed to a chair (including a wheelchair)?: Total Help needed standing up from a chair using your arms (e.g., wheelchair or bedside chair)?: Total Help needed to walk in hospital room?: Total Help needed climbing 3-5 steps with a railing? : Total 6 Click Score: 6    End of Session Equipment Utilized During Treatment: Oxygen Activity Tolerance: Patient limited by fatigue Patient left: in bed;with call bell/phone within reach;with nursing/sitter in room;with bed alarm set Nurse Communication: Mobility status PT Visit Diagnosis: Muscle weakness (generalized) (M62.81);Difficulty in walking, not elsewhere classified (R26.2)     Time: 9088-9052 PT Time Calculation (min) (ACUTE ONLY): 36 min  Charges:    $Therapeutic Activity: 8-22 mins PT General Charges $$ ACUTE PT VISIT: 1 Visit                    Darryle George, PTA Acute Rehabilitation Services Secure Chat Preferred   [phone]    Darryle George 09/21/2024, 10:57 AM

## 2024-09-22 ENCOUNTER — Inpatient Hospital Stay (HOSPITAL_COMMUNITY)

## 2024-09-22 DIAGNOSIS — K810 Acute cholecystitis: Secondary | ICD-10-CM | POA: Diagnosis not present

## 2024-09-22 LAB — CBC WITH DIFFERENTIAL/PLATELET
Abs Immature Granulocytes: 0.31 10*3/uL — ABNORMAL HIGH (ref 0.00–0.07)
Basophils Absolute: 0 10*3/uL (ref 0.0–0.1)
Basophils Relative: 0 %
Eosinophils Absolute: 0.1 10*3/uL (ref 0.0–0.5)
Eosinophils Relative: 1 %
HCT: 29.5 % — ABNORMAL LOW (ref 39.0–52.0)
Hemoglobin: 9.5 g/dL — ABNORMAL LOW (ref 13.0–17.0)
Immature Granulocytes: 3 %
Lymphocytes Relative: 10 %
Lymphs Abs: 1 10*3/uL (ref 0.7–4.0)
MCH: 28.5 pg (ref 26.0–34.0)
MCHC: 32.2 g/dL (ref 30.0–36.0)
MCV: 88.6 fL (ref 80.0–100.0)
Monocytes Absolute: 1 10*3/uL (ref 0.1–1.0)
Monocytes Relative: 10 %
Neutro Abs: 7.7 10*3/uL (ref 1.7–7.7)
Neutrophils Relative %: 76 %
Platelets: 121 10*3/uL — ABNORMAL LOW (ref 150–400)
RBC: 3.33 MIL/uL — ABNORMAL LOW (ref 4.22–5.81)
RDW: 15 % (ref 11.5–15.5)
Smear Review: NORMAL
WBC: 10.1 10*3/uL (ref 4.0–10.5)
nRBC: 0 % (ref 0.0–0.2)

## 2024-09-22 LAB — COMPREHENSIVE METABOLIC PANEL WITH GFR
ALT: 169 U/L — ABNORMAL HIGH (ref 0–44)
AST: 87 U/L — ABNORMAL HIGH (ref 15–41)
Albumin: 2.2 g/dL — ABNORMAL LOW (ref 3.5–5.0)
Alkaline Phosphatase: 114 U/L (ref 38–126)
Anion gap: 13 (ref 5–15)
BUN: 57 mg/dL — ABNORMAL HIGH (ref 8–23)
CO2: 23 mmol/L (ref 22–32)
Calcium: 7.4 mg/dL — ABNORMAL LOW (ref 8.9–10.3)
Chloride: 99 mmol/L (ref 98–111)
Creatinine, Ser: 4.47 mg/dL — ABNORMAL HIGH (ref 0.61–1.24)
GFR, Estimated: 12 mL/min — ABNORMAL LOW
Glucose, Bld: 97 mg/dL (ref 70–99)
Potassium: 4.4 mmol/L (ref 3.5–5.1)
Sodium: 135 mmol/L (ref 135–145)
Total Bilirubin: 0.4 mg/dL (ref 0.0–1.2)
Total Protein: 4.3 g/dL — ABNORMAL LOW (ref 6.5–8.1)

## 2024-09-22 LAB — GLUCOSE, CAPILLARY
Glucose-Capillary: 113 mg/dL — ABNORMAL HIGH (ref 70–99)
Glucose-Capillary: 117 mg/dL — ABNORMAL HIGH (ref 70–99)
Glucose-Capillary: 122 mg/dL — ABNORMAL HIGH (ref 70–99)
Glucose-Capillary: 135 mg/dL — ABNORMAL HIGH (ref 70–99)
Glucose-Capillary: 40 mg/dL — CL (ref 70–99)
Glucose-Capillary: 85 mg/dL (ref 70–99)
Glucose-Capillary: 91 mg/dL (ref 70–99)

## 2024-09-22 LAB — MAGNESIUM: Magnesium: 2.1 mg/dL (ref 1.7–2.4)

## 2024-09-22 LAB — PROTIME-INR
INR: 1.4 — ABNORMAL HIGH (ref 0.8–1.2)
Prothrombin Time: 17.8 s — ABNORMAL HIGH (ref 11.4–15.2)

## 2024-09-22 MED ORDER — MIDAZOLAM HCL (PF) 2 MG/2ML IJ SOLN
INTRAMUSCULAR | Status: AC | PRN
  Administered 2024-09-22 (×3): .5 mg via INTRAVENOUS
  Administered 2024-09-22: 1 mg via INTRAVENOUS
  Administered 2024-09-22 (×2): .5 mg via INTRAVENOUS

## 2024-09-22 MED ORDER — SODIUM CHLORIDE 0.9 % IV SOLN
12.5000 mg | Freq: Three times a day (TID) | INTRAVENOUS | Status: AC | PRN
  Administered 2024-09-22: 12.5 mg via INTRAVENOUS
  Filled 2024-09-22 (×2): qty 0.5

## 2024-09-22 MED ORDER — FENTANYL CITRATE (PF) 100 MCG/2ML IJ SOLN
INTRAMUSCULAR | Status: AC
  Filled 2024-09-22: qty 2

## 2024-09-22 MED ORDER — LIDOCAINE-EPINEPHRINE 1 %-1:100000 IJ SOLN
20.0000 mL | Freq: Once | INTRAMUSCULAR | Status: AC
  Administered 2024-09-22: 10 mL via INTRADERMAL
  Filled 2024-09-22: qty 20

## 2024-09-22 MED ORDER — IOHEXOL 300 MG/ML  SOLN
50.0000 mL | Freq: Once | INTRAMUSCULAR | Status: AC | PRN
  Administered 2024-09-22: 15 mL

## 2024-09-22 MED ORDER — FENTANYL CITRATE (PF) 100 MCG/2ML IJ SOLN
INTRAMUSCULAR | Status: AC | PRN
  Administered 2024-09-22 (×5): 25 ug via INTRAVENOUS

## 2024-09-22 MED ORDER — SODIUM CHLORIDE 0.9 % IV SOLN
INTRAVENOUS | Status: AC
  Filled 2024-09-22: qty 20

## 2024-09-22 MED ORDER — LIDOCAINE-EPINEPHRINE 1 %-1:100000 IJ SOLN
INTRAMUSCULAR | Status: AC
  Filled 2024-09-22: qty 20

## 2024-09-22 MED ORDER — MIDAZOLAM HCL 2 MG/2ML IJ SOLN
INTRAMUSCULAR | Status: AC
  Filled 2024-09-22: qty 2

## 2024-09-22 MED ORDER — DEXTROSE 10 % IV SOLN: INTRAVENOUS | Status: AC

## 2024-09-22 MED ORDER — DEXTROSE 50 % IV SOLN
25.0000 g | INTRAVENOUS | Status: AC
  Administered 2024-09-22: 25 g via INTRAVENOUS
  Filled 2024-09-22: qty 50

## 2024-09-22 MED ORDER — IOHEXOL 300 MG/ML  SOLN
50.0000 mL | Freq: Once | INTRAMUSCULAR | Status: AC | PRN
  Administered 2024-09-22: 45 mL

## 2024-09-22 MED ORDER — HEPARIN SODIUM (PORCINE) 1000 UNIT/ML IJ SOLN
INTRAMUSCULAR | Status: AC
  Filled 2024-09-22: qty 3

## 2024-09-22 NOTE — Progress Notes (Signed)
 "   Referring Physician(s): Dr. Wilhelmenia   Supervising Physician: Jenna Hacker  Patient Status:  [facility] - In-pt  Chief Complaint: Acute calculous cholecystitis, acute cholangitis, s/p biliary drains placed on 1/28 by Dr Hughes and 1/29 by Dr Jenna   Subjective: Patient awake in bed watching TV. He is minimally conversational.   Allergies: Patient has no known allergies.  Medications: Prior to Admission medications  Not on File     Vital Signs: BP (!) 108/58 (BP Location: Left Arm)   Pulse 85   Temp 98 F (36.7 C) (Oral)   Resp 20   Ht 5' 7 (1.702 m)   Wt 127 lb 10.3 oz (57.9 kg) Comment: from chart  SpO2 97%   BMI 19.99 kg/m   Physical Exam Constitutional:      Appearance: He is ill-appearing.  Cardiovascular:     Rate and Rhythm: Normal rate.     Comments: Right IJ temp cath. Left IJ CVC Pulmonary:     Effort: Pulmonary effort is normal.  Abdominal:     Tenderness: There is no abdominal tenderness.     Comments: Two RUQ drains. The lower drain has zero fluid in the gravity bag. The upper drain has approximately 100 ml of bloody fluid in bag. The dressings over the drains are saturated with serous fluid. The patient denies abdominal tenderness to palpation.   Musculoskeletal:     Right lower leg: Edema present.     Left lower leg: Edema present.  Skin:    General: Skin is warm and dry.  Neurological:     Mental Status: He is alert. He is disoriented.     Labs:  CBC: Recent Labs    09/19/24 0417 09/20/24 0256 09/21/24 0412 09/22/24 0311  WBC 11.1* 12.5* 12.0* 10.1  HGB 10.8* 10.8* 10.6* 9.5*  HCT 31.9* 32.6* 32.7* 29.5*  PLT 76* 93* 106* 121*    COAGS: Recent Labs    09/14/24 0805 09/15/24 0502 09/22/24 0310  INR 1.5* 1.5* 1.4*    BMP: Recent Labs    09/19/24 0417 09/20/24 0256 09/21/24 0412 09/22/24 0310  NA 135 134* 133* 135  K 5.0 5.0 5.1 4.4  CL 97* 97* 97* 99  CO2 21* 23 21* 23  GLUCOSE 61* 91 93 97  BUN 63* 70* 78*  57*  CALCIUM  7.1* 7.4* 7.5* 7.4*  CREATININE 4.21* 4.83* 5.42* 4.47*  GFRNONAA 13* 11* 9* 12*    LIVER FUNCTION TESTS: Recent Labs    09/19/24 0417 09/20/24 0256 09/21/24 0412 09/22/24 0310  BILITOT 0.7 0.5 0.5 0.4  AST 269* 168* 119* 87*  ALT 267* 239* 207* 169*  ALKPHOS 177* 133* 126 114  PROT 4.3* 4.3* 4.5* 4.3*  ALBUMIN  2.2*  2.2* 2.1* 2.2* 2.2*    Assessment and Plan:  Acute calculous cholecystitis, acute cholangitis, s/p biliary drains placed on 1/28 by Dr Hughes and 1/29 by Dr Jenna   CT abdomen 09/21/24 showed: IMPRESSION: 1. Percutaneous transhepatic biliary catheter, which traverses the pleural space at the right lateral costophrenic angle. The distal aspect is likely coiled within the downstream common bile duct, as it is not clearly within the duodenal lumen on reconstructed images. There has been significant interval decompression of the biliary system, with only minimal residual intrahepatic biliary duct dilation. 2. Percutaneous cholecystostomy tube, with marked decompression of the gallbladder. Persistent findings of cholecystitis and cholelithiasis. 3. Large bilateral pleural effusions and bilateral lower lobe compressive atelectasis, right greater than left. 4. Small volume ascites.  5.  Aortic Atherosclerosis (ICD10-I70.0). 6. Large hiatal hernia.  Patient scheduled today for an image-guided biliary drain exchange and right thoracentesis. Procedures were discussed with the patient's son, [name], and telephone consent was obtained.   Risks and benefits of today's procedures discussed with the patient including bleeding, infection, damage to adjacent structures, bowel perforation/fistula connection, pneumothorax and sepsis.  All of the patient's questions were answered, patient is agreeable to proceed. He has been NPO.   Consent signed and in IR.  Electronically Signed: Kiyon Fidalgo, AGACNP-BC 09/22/2024, 9:05 AM   I spent a total of 15  Minutes at the the patient's bedside AND on the patient's hospital floor or unit, greater than 50% of which was counseling/coordinating care for acute calculous cholecystitis, acute cholangitis      "

## 2024-09-22 NOTE — Plan of Care (Signed)
   Problem: Clinical Measurements: Goal: Diagnostic test results will improve Outcome: Progressing Goal: Respiratory complications will improve Outcome: Progressing   Problem: Safety: Goal: Ability to remain free from injury will improve Outcome: Progressing

## 2024-09-22 NOTE — Progress Notes (Signed)
 SLP Cancellation Note  Patient Details Name: Benjamin [name] MRN: [id number] DOB: [birth year]   Cancelled treatment:       Reason Eval/Treat Not Completed: Patient at procedure or test/unavailable (NPO for procedure this AM and now in HD). SLP will f/u as able.    Damien Blumenthal, M.A., CCC-SLP Speech Language Pathology, Acute Rehabilitation Services  Secure Chat preferred [phone]  09/22/2024, 2:48 PM

## 2024-09-22 NOTE — Progress Notes (Signed)
 Patient just returned from HD.His dressing on biliary drain site was all soaked up.Patient is also having hiccups.New dressing placed and padded.Secure chat sent to Dr Tobie.

## 2024-09-22 NOTE — Procedures (Signed)
 Interventional Radiology Procedure Note  Procedure:Thoracentesis right side.  Removal of int/ext biliary drain.  Placement of new int/ext biliary drain  Complications: None  Estimated Blood Loss: < 10 mL  Findings: Indwelling biliary catheter injected with contrast while gaining new int/ext biliary access in the right lobe.  Old catheter removed.  New catheter placed beyond duodenal diverticulum.  Extra side holes created to facilitate draining.  Thoracentesis left side for removal of fluid.  Benjamin DELENA Banner, MD

## 2024-09-22 NOTE — Plan of Care (Signed)
" °  Problem: Clinical Measurements: Goal: Ability to maintain clinical measurements within normal limits will improve Outcome: Progressing Goal: Will remain free from infection Outcome: Progressing Goal: Respiratory complications will improve Outcome: Progressing   Problem: Coping: Goal: Level of anxiety will decrease Outcome: Progressing   Problem: Safety: Goal: Ability to remain free from injury will improve Outcome: Progressing   Problem: Skin Integrity: Goal: Risk for impaired skin integrity will decrease Outcome: Progressing   Problem: Clinical Measurements: Goal: Signs and symptoms of infection will decrease Outcome: Progressing   "

## 2024-09-22 NOTE — Progress Notes (Signed)
 [facility] Progress Note Patient: Benjamin [name] FMW:[id number] DOB: [birth year]  DOA: 09/13/2024 DOS: the patient was seen and examined on 09/22/2024  Brief Summary: Patient is a 89 y.o.  male with history of BPH-presented to [facility] on 1/27 with sepsis due to acute cholecystitis/cholangitis/E. coli bacteremia-unfortunately postadmission-he developed septic shock and was transferred to [facility] and ICU.  Patient was then evaluated by GI/CCS/IR-underwent cholecystostomy tube placement-GI attempted ERCP but was unsuccessful-and subsequently required biliary drain placement by IR.  Unfortunately further hospital course was complicated by development of AKI   Significant events: 1/27>> [facility] to [facility]-sepsis due to cholecystitis/cholangitis-post admission-progressed to septic shock 1/28>> transferred to ICU at [facility].  IR placed cholecystostomy tube. 1/29>> attempted ERCP by GI-unsuccessful due to duodenal diverticula-evaluated by IR-and underwent trans hepatic internal/external CBD drain placement. 1/30>> nephrology consulted for worsening AKI 2/2>> transferred to [facility] 2/3>> worsening AKI-oliguric-discussed with nephrology-keep n.p.o.-possible temporary HD catheter placement    Significant studies: 1/27>> CTA chest: No PE-large hiatal hernia. 1/27>> CT abdomen/pelvis: Acute cholecystitis-cholelithiasis with choledocholithiasis-intrahepatic/extrahepatic biliary ductal dilation- 1/28>> RUQ ultrasound: Distended gallbladder with stones/sludge. 1/30>> echo: EF 45%   Significant microbiology data: 1/27>> COVID/influenza/RSV PCR: Negative 1/27>> blood culture: E. coli 1/28>> bile culture: E. coli, Klebsiella pneumoniae, Bacteroides   Procedures: See above   Consults: IR CCS GI PCCM  Assessment and plan: Septic shock secondary to E. coli bacteremia due to acute calculous cholecystitis-acute cholangitis-in the setting of choledocholithiasis Sepsis physiology rapidly improving S/p  cholecystostomy and drain on 1/28-and s/p internal/external biliary drain on 1/29 (ERCP unsuccessful due to duodenal diverticulum) Remains on IV Rocephin /Flagyl  General surgery has signed off 2/2-Will need outpatient follow-up with general surgery for elective cholecystectomy IR following for drain care.  Biliary drain was traversing from pleural space. Went for drain exchange.  After procedure has more hiccups.  Chest x-ray ordered.  Thorazine  as needed.   Acute renal failure. Multifactorial-likely ischemic ATN and contrast-induced nephropathy Remains oliguric-worsening renal function Discussed with nephrology-Dr. Peoples-initiating hemodialysis.  Monitor for improvement. Avoid nephrotoxic agents.   Thrombocytopenia Likely secondary to sepsis/gram-negative bacteremia Follow CBC periodically   Normocytic anemia Secondary to critical illness/worsening AKI Follow CBC-transfuse if significant drop No evidence of GI bleeding.   PAF with RVR Acute in the ICU Maintaining sinus rhythm Echo with slightly suppressed EF TSH elevated (19.9) Anticoagulation not started-due to thrombocytopenia Platelet counts are now improving. All the patient still require recurrent procedures. We discussed with family with regards anticoagulation/goals of care.   Transaminitis Secondary to cholecystitis/cholangitis-possible shock liver Downtrending Follow LFTs.   History of BPH Currently has Foley catheter in place Continue Flomax .  Awaiting for improvement in mentation for voiding trial.   History of hypothyroidism Noncompliant with thyroid medication. TSH elevated. Currently on IV Synthroid . Repeat thyroid function in 6 weeks    Debility/deconditioning Secondary to acute/critical illness. PT/OT eval-SNF recommended   Goals of care conversation. Palliative care consult placed. Discussed with son at bedside. He will discuss with his brother. Offered to have a family meeting as  well. Patient's prognosis is very poor especially in the setting of cardiac arrest. In the past patient has discussed desire for remaining full code.   Dysphagia. SLP reconsulted. Currently NPO. Started on D10.   Poor p.o. intake with adult failure to thrive. Placing the patient at a high risk for poor outcome. Body mass index is 24.27 kg/m.     Code Status: Full Code   DVT Prophylaxis: SCDs Start: 09/14/24 0007   Data review I have Reviewed  nursing notes, Vitals, and Lab results. Since last encounter, pertinent lab results CBC and BMP   . I have ordered test including CBC and BMP  . I have ordered imaging chest x-ray  .    Physical exam. Vitals:   09/22/24 1535 09/22/24 1544 09/22/24 1700 09/22/24 1742  BP:  (!) 113/58 (!) 91/58 120/84  Pulse: 64 84 74 92  Resp: 14 16 13 13   Temp:  (!) 96.9 F (36.1 C) 97.8 F (36.6 C)   TempSrc:   Axillary   SpO2: 100% 100% 100% 100%  Weight:  70.3 kg    Height:      Basal crackles. S1-S2 present. Oral mucosa dry. Pupils are equal and reactive to light. Equal strength bilaterally. Able to follow commands and identify son at bedside.  Subjective: Fatigue and tired.  No nausea no vomiting.  Minimal oral intake.  Family Communication: Son at bedside.  Disposition Plan: Status is: Inpatient Remains inpatient appropriate because: Monitor for improvement in mentation and renal function.   Planned Discharge Destination: TBD Diet: Diet Order             Diet NPO time specified Except for: Sips with Meds  Diet effective midnight                   MEDICATIONS: Scheduled Meds:  Chlorhexidine  Gluconate Cloth  6 each Topical Daily   Chlorhexidine  Gluconate Cloth  6 each Topical Q0600   levothyroxine   12.5 mcg Intravenous Daily   midodrine   5 mg Oral TID WC   pantoprazole   40 mg Oral Daily   sodium chloride  flush  5 mL Intracatheter Q8H   sodium chloride  flush  5 mL Intracatheter Q8H   tamsulosin   0.4 mg Oral Daily    Continuous Infusions:  cefTRIAXone  (ROCEPHIN )  IV 2 g (09/22/24 1347)   chlorproMAZINE  (THORAZINE ) 12.5 mg in sodium chloride  0.9 % 25 mL IVPB     dextrose  40 mL/hr at 09/22/24 1453   metronidazole  500 mg (09/22/24 0928)   PRN Meds:.acetaminophen  **OR** acetaminophen , chlorproMAZINE  (THORAZINE ) 12.5 mg in sodium chloride  0.9 % 25 mL IVPB, ondansetron  **OR** ondansetron  (ZOFRAN ) IV, prochlorperazine , sodium chloride  flush  Author: Yetta Blanch, MD  [facility] 09/22/2024  7:28 PM Between 7PM-7AM, please contact night-coverage, check [email or web address] for on call.

## 2024-09-22 NOTE — Progress Notes (Signed)
 [facility] NEPHROLOGY PROGRESS NOTE  Assessment/ Plan: Pt is a 89 y.o. yo male  with past medical history significant for dyslipidemia, thyroid disease, acid reflux, BPH with urinary retention who was initially presented to [facility] on 1/27 with generalized body pain, weakness, fever, abdominal pain, decreased oral intake, seen as a consultation for AKI.  # Acute kidney injury: Likely ischemic ATN in setting of septic shock.  Baseline creatinine normal.  Urine output remains very minimal.  Poor long-term dialysis candidate given his age.  Remains oliguric.   - Appreciate help from IR with temporary dialysis catheter on 2/3 - Plan for dialysis again today then likely Saturday -If the patient has not improved within a week will need to have further goals of care discussions with him and his sons.  Poor outpatient dialysis candidate. -Please avoid hypotensive episode, IV contrast or nephrotoxins.   # Septic shock due to E. coli bacteremia, off of pressor.  Antibiotics per primary team.     # Cholangitis status post percutaneous cholecystostomy with biliary drain.  ERCP attempted but unsuccessful. Transaminitis improving.  Continue with management per IR   # Acute metabolic encephalopathy due to sepsis.  Status seems to be fluctuating.   Subjective: Patient more lethargic today.  Minimally conversant.  Plan to get second session of dialysis today.  Objective Vital signs in last 24 hours: Vitals:   09/22/24 0000 09/22/24 0007 09/22/24 0400 09/22/24 0757  BP: (!) 116/55 114/60 (!) 125/59 (!) 108/58  Pulse: 82 80 82 85  Resp: 16 18 19 20   Temp: 98.9 F (37.2 C) 98.9 F (37.2 C) 98.9 F (37.2 C) 98 F (36.7 C)  TempSrc: Oral Oral Oral Oral  SpO2: 98% 97% 100% 97%  Weight:      Height:       Weight change:   Intake/Output Summary (Last 24 hours) at 09/22/2024 1032 Last data filed at 09/22/2024 0230 Gross per 24 hour  Intake 317.99 ml  Output 350 ml  Net -32.01 ml        Labs: RENAL PANEL Recent Labs  Lab 09/17/24 0820 09/17/24 0856 09/18/24 0717 09/19/24 0417 09/20/24 0256 09/21/24 0412 09/22/24 0310  NA 134*  --  135 135 134* 133* 135  K 4.4  --  4.7 5.0 5.0 5.1 4.4  CL 98  --  97* 97* 97* 97* 99  CO2 27  --  26 21* 23 21* 23  GLUCOSE 88  --  65* 61* 91 93 97  BUN 48*  --  56* 63* 70* 78* 57*  CREATININE 3.04*  --  3.69* 4.21* 4.83* 5.42* 4.47*  CALCIUM  7.0*  --  7.1* 7.1* 7.4* 7.5* 7.4*  MG  --  1.9  --   --   --   --  2.1  PHOS 2.9  --  3.8 5.2*  --   --   --   ALBUMIN  2.6*  --  2.2* 2.2*  2.2* 2.1* 2.2* 2.2*    Liver Function Tests: Recent Labs  Lab 09/20/24 0256 09/21/24 0412 09/22/24 0310  AST 168* 119* 87*  ALT 239* 207* 169*  ALKPHOS 133* 126 114  BILITOT 0.5 0.5 0.4  PROT 4.3* 4.5* 4.3*  ALBUMIN  2.1* 2.2* 2.2*   No results for input(s): LIPASE, AMYLASE in the last 168 hours.  No results for input(s): AMMONIA in the last 168 hours. CBC: Recent Labs    09/18/24 0717 09/19/24 0417 09/20/24 0256 09/21/24 0412 09/22/24 0311  HGB 9.4* 10.8*  10.8* 10.6* 9.5*  MCV 85.9 86.7 89.6 90.1 88.6  VITAMINB12  --   --  >4,000*  --   --   FOLATE  --   --  11.1  --   --   FERRITIN  --   --  418*  --   --   TIBC  --   --  113*  --   --   IRON  --   --  58  --   --   RETICCTPCT  --   --  1.6  --   --     Cardiac Enzymes: No results for input(s): CKTOTAL, CKMB, CKMBINDEX, TROPONINI in the last 168 hours. CBG: Recent Labs  Lab 09/21/24 0040 09/21/24 1244 09/22/24 0038 09/22/24 0535 09/22/24 0805  GLUCAP 85 118* 113* 91 85    Iron Studies:  Recent Labs    09/20/24 0256  IRON 58  TIBC 113*  FERRITIN 418*   Studies/Results: CT ABDOMEN WO CONTRAST Result Date: 09/21/2024 CLINICAL DATA:  Biliary drain evaluation EXAM: CT ABDOMEN WITHOUT CONTRAST TECHNIQUE: Multidetector CT imaging of the abdomen was performed following the standard protocol without IV contrast. RADIATION DOSE REDUCTION: This exam  was performed according to the departmental dose-optimization program which includes automated exposure control, adjustment of the mA and/or kV according to patient size and/or use of iterative reconstruction technique. COMPARISON:  09/13/2024, 09/21/2024 FINDINGS: Lower chest: There are large bilateral pleural effusions with lower lobe compressive atelectasis, right greater than left. Large hiatal hernia is again noted. Hepatobiliary: A percutaneous cholecystostomy tube is identified, with interval decompression of the gallbladder. There is persistent gallbladder wall thickening and small calcified gallstones. Small amount of gas within the gallbladder lumen consistent with indwelling drain. There is a percutaneous trans hepatic biliary drain. The proximal aspect of this drain traverses the pleural space within the right lateral costophrenic angle. The distal aspect is coiled within the region of the dilated common bile duct seen previously. The distal tip of the catheter does not appear to extend into the duodenal lumen. There is mild residual intrahepatic biliary duct dilation, with marked improvement since prior study. Small amount of pneumobilia is seen consistent with recent catheter placement. Pancreas: Unremarkable unenhanced appearance. Spleen: Unremarkable unenhanced appearance. Adrenals/Urinary Tract: The kidneys and adrenals are stable in appearance. Stomach/Bowel: No bowel obstruction or ileus. Scattered colonic diverticulosis without diverticulitis. No bowel wall thickening or inflammatory change. Large hiatal hernia. Vascular/Lymphatic: Diffuse atherosclerosis of the aorta and its branches. No pathologic adenopathy. Other: Trace free fluid within the bilateral paracolic gutters, left greater than right. No free intraperitoneal gas. No abdominal wall hernia. Musculoskeletal: No acute or destructive bony abnormalities. Reconstructed images demonstrate no additional findings. IMPRESSION: 1. Percutaneous  transhepatic biliary catheter, which traverses the pleural space at the right lateral costophrenic angle. The distal aspect is likely coiled within the downstream common bile duct, as it is not clearly within the duodenal lumen on reconstructed images. There has been significant interval decompression of the biliary system, with only minimal residual intrahepatic biliary duct dilation. 2. Percutaneous cholecystostomy tube, with marked decompression of the gallbladder. Persistent findings of cholecystitis and cholelithiasis. 3. Large bilateral pleural effusions and bilateral lower lobe compressive atelectasis, right greater than left. 4. Small volume ascites. 5.  Aortic Atherosclerosis (ICD10-I70.0). 6. Large hiatal hernia. Electronically Signed   By: Ozell Daring M.D.   On: 09/21/2024 18:11   IR CHOLANGIOGRAM EXISTING TUBE Result Date: 09/21/2024 CLINICAL DATA:  89 year old male with history of biliary obstruction status  post internal external biliary drain placement on 09/15/2024. EXAM: Biliary drain check COMPARISON:  09/15/2024 CONTRAST:  10 mL Omnipaque  300-administered via the existing percutaneous drain. FLUOROSCOPY TIME:  Five mGy reference air kerma TECHNIQUE: The patient was positioned supine on the fluoroscopy table. A preprocedural spot fluoroscopic image was obtained of the right upper quadrant and the existing percutaneous drainage catheter. Multiple spot fluoroscopic and radiographic images were obtained following the injection of a small amount of contrast via the existing percutaneous drainage catheter. The drain was placed back to bag drainage. Sterile bandage was applied. FINDINGS: Suspected trans pleural peripheral coarse of indwelling drain. The pigtail portion appears to be within the second portion of the duodenum. Contrast injection demonstrates patency of the peripheral aspect of the tube without passage of contrast beyond the pigtail portion. There is persistent mild dilation of the  common bile duct which appears occluded at the ampulla. Interval development of moderate right pleural effusion. IMPRESSION: Suspected transpleural transgression of indwelling right-sided internal external biliary drainage catheter which is occluded distally. PLAN: Obtain CT abdomen to delineate exact course of indwelling drain. Interventional Radiology will arrange for biliary drain exchange versus new placement tomorrow. Ester Sides, MD [facility] Electronically Signed   By: Ester Sides M.D.   On: 09/21/2024 16:19   IR NON-TUNNELED CENTRAL VENOUS CATH [facility] W IMG Result Date: 09/20/2024 INDICATION: ESRD requiring HD initiation. EXAM: NON-TUNNELED CENTRAL VENOUS HEMODIALYSIS CATHETER PLACEMENT WITH ULTRASOUND AND FLUOROSCOPIC GUIDANCE COMPARISON:  Chest XR, 09/15/2024 MEDICATIONS: None FLUOROSCOPY: Radiation Exposure Index and estimated peak skin dose (PSD); Reference air kerma (RAK), 0.2 mGy. COMPLICATIONS: None immediate. PROCEDURE: Informed written consent was obtained from the patient and/or patient's representative after a discussion of the risks, benefits, and alternatives to treatment. Questions regarding the procedure were encouraged and answered. The RIGHT neck and chest were prepped with chlorhexidine  in a sterile fashion, and a sterile drape was applied covering the operative field. Maximum barrier sterile technique with sterile gowns and gloves were used for the procedure. A timeout was performed prior to the initiation of the procedure. After the overlying soft tissues were anesthetized, a small venotomy incision was created and a micropuncture kit was utilized to access the internal jugular vein. Real-time ultrasound guidance was utilized for vascular access including the acquisition of a permanent ultrasound image documenting patency of the accessed vessel. The microwire was utilized to measure appropriate catheter length. A stiff  glidewire was advanced to the level of the IVC. Under fluoroscopic guidance, the venotomy was serially dilated, ultimately allowing placement of a 20 cm temporary Trialysis catheter with tip ultimately terminating within the superior aspect of the right atrium. Final catheter positioning was confirmed and documented with a spot radiographic image. The catheter aspirates and flushes normally. The catheter was flushed with appropriate volume heparin  dwells. The catheter exit site was secured with a 2-0 Ethilon retention suture. A dressing was placed. The patient tolerated the procedure well without immediate post procedural complication. IMPRESSION: Successful placement of a RIGHT internal jugular approach 20 cm temporary dialysis catheter. The catheter is ready for immediate use. PLAN: This catheter may be converted to a tunneled dialysis catheter at a later date as indicated. Thom Hall, MD [facility] Electronically Signed   By: Thom Hall M.D.   On: 09/20/2024 11:44     Medications: Infusions:  cefTRIAXone  (ROCEPHIN )  IV 2 g (09/21/24 1259)   metronidazole  500 mg (09/22/24 0928)    Scheduled  Medications:  Chlorhexidine  Gluconate Cloth  6 each Topical Daily   Chlorhexidine  Gluconate Cloth  6 each Topical Q0600   feeding supplement  1 Container Oral TID BM   levothyroxine   12.5 mcg Intravenous Daily   melatonin  3 mg Oral QHS   midodrine   5 mg Oral TID WC   pantoprazole   40 mg Oral Daily   sodium chloride  flush  5 mL Intracatheter Q8H   sodium chloride  flush  5 mL Intracatheter Q8H   tamsulosin   0.4 mg Oral Daily   traZODone   50 mg Oral QHS    have reviewed scheduled and prn medications.  Physical Exam: General:NAD, comfortable, poor dentition Heart: Normal rate, no rub Lungs: Bilateral chest rise no increased work of breathing Abdomen:soft, Non-tender, non-distended Extremities:No edema Neurology: Sleeping but will wake up  to voice and touch, more lethargic  Jefferson Savera Donson 09/22/2024,10:32 AM  LOS: 9 days

## 2024-09-23 ENCOUNTER — Other Ambulatory Visit (HOSPITAL_COMMUNITY): Payer: Self-pay

## 2024-09-23 DIAGNOSIS — Z515 Encounter for palliative care: Secondary | ICD-10-CM

## 2024-09-23 LAB — COMPREHENSIVE METABOLIC PANEL WITH GFR
ALT: 140 U/L — ABNORMAL HIGH (ref 0–44)
AST: 79 U/L — ABNORMAL HIGH (ref 15–41)
Albumin: 2 g/dL — ABNORMAL LOW (ref 3.5–5.0)
Alkaline Phosphatase: 151 U/L — ABNORMAL HIGH (ref 38–126)
Anion gap: 13 (ref 5–15)
BUN: 62 mg/dL — ABNORMAL HIGH (ref 8–23)
CO2: 22 mmol/L (ref 22–32)
Calcium: 7.2 mg/dL — ABNORMAL LOW (ref 8.9–10.3)
Chloride: 100 mmol/L (ref 98–111)
Creatinine, Ser: 5.27 mg/dL — ABNORMAL HIGH (ref 0.61–1.24)
GFR, Estimated: 10 mL/min — ABNORMAL LOW
Glucose, Bld: 122 mg/dL — ABNORMAL HIGH (ref 70–99)
Potassium: 4.2 mmol/L (ref 3.5–5.1)
Sodium: 135 mmol/L (ref 135–145)
Total Bilirubin: 0.4 mg/dL (ref 0.0–1.2)
Total Protein: 4.1 g/dL — ABNORMAL LOW (ref 6.5–8.1)

## 2024-09-23 LAB — PROTIME-INR
INR: 1.5 — ABNORMAL HIGH (ref 0.8–1.2)
Prothrombin Time: 19.2 s — ABNORMAL HIGH (ref 11.4–15.2)

## 2024-09-23 LAB — CBC WITH DIFFERENTIAL/PLATELET
Abs Immature Granulocytes: 0.25 10*3/uL — ABNORMAL HIGH (ref 0.00–0.07)
Basophils Absolute: 0 10*3/uL (ref 0.0–0.1)
Basophils Relative: 0 %
Eosinophils Absolute: 0.1 10*3/uL (ref 0.0–0.5)
Eosinophils Relative: 0 %
HCT: 23 % — ABNORMAL LOW (ref 39.0–52.0)
Hemoglobin: 7.7 g/dL — ABNORMAL LOW (ref 13.0–17.0)
Immature Granulocytes: 2 %
Lymphocytes Relative: 9 %
Lymphs Abs: 1 10*3/uL (ref 0.7–4.0)
MCH: 30.1 pg (ref 26.0–34.0)
MCHC: 33.5 g/dL (ref 30.0–36.0)
MCV: 89.8 fL (ref 80.0–100.0)
Monocytes Absolute: 0.8 10*3/uL (ref 0.1–1.0)
Monocytes Relative: 7 %
Neutro Abs: 9 10*3/uL — ABNORMAL HIGH (ref 1.7–7.7)
Neutrophils Relative %: 82 %
Platelets: 139 10*3/uL — ABNORMAL LOW (ref 150–400)
RBC: 2.56 MIL/uL — ABNORMAL LOW (ref 4.22–5.81)
RDW: 15.4 % (ref 11.5–15.5)
WBC: 11.1 10*3/uL — ABNORMAL HIGH (ref 4.0–10.5)
nRBC: 0 % (ref 0.0–0.2)

## 2024-09-23 LAB — FIBRINOGEN: Fibrinogen: 389 mg/dL (ref 210–475)

## 2024-09-23 LAB — CBC
HCT: 23.7 % — ABNORMAL LOW (ref 39.0–52.0)
Hemoglobin: 7.6 g/dL — ABNORMAL LOW (ref 13.0–17.0)
MCH: 28.6 pg (ref 26.0–34.0)
MCHC: 32.1 g/dL (ref 30.0–36.0)
MCV: 89.1 fL (ref 80.0–100.0)
Platelets: 148 10*3/uL — ABNORMAL LOW (ref 150–400)
RBC: 2.66 MIL/uL — ABNORMAL LOW (ref 4.22–5.81)
RDW: 15.7 % — ABNORMAL HIGH (ref 11.5–15.5)
WBC: 10.1 10*3/uL (ref 4.0–10.5)
nRBC: 0 % (ref 0.0–0.2)

## 2024-09-23 LAB — RETICULOCYTES
Immature Retic Fract: 29.6 % — ABNORMAL HIGH (ref 2.3–15.9)
RBC.: 2.59 MIL/uL — ABNORMAL LOW (ref 4.22–5.81)
Retic Count, Absolute: 64.8 10*3/uL (ref 19.0–186.0)
Retic Ct Pct: 2.5 % (ref 0.4–3.1)

## 2024-09-23 LAB — GLUCOSE, CAPILLARY
Glucose-Capillary: 116 mg/dL — ABNORMAL HIGH (ref 70–99)
Glucose-Capillary: 122 mg/dL — ABNORMAL HIGH (ref 70–99)
Glucose-Capillary: 128 mg/dL — ABNORMAL HIGH (ref 70–99)

## 2024-09-23 LAB — TYPE AND SCREEN
ABO/RH(D): O POS
Antibody Screen: NEGATIVE

## 2024-09-23 LAB — HEPATITIS B SURFACE ANTIBODY, QUANTITATIVE: Hep B S AB Quant (Post): <3.5 m[IU]/mL — ABNORMAL LOW

## 2024-09-23 LAB — DIRECT ANTIGLOBULIN TEST (NOT AT ARMC)
DAT, IgG: NEGATIVE
DAT, complement: NEGATIVE

## 2024-09-23 LAB — MAGNESIUM: Magnesium: 2.2 mg/dL (ref 1.7–2.4)

## 2024-09-23 MED ORDER — ACETAMINOPHEN 325 MG PO TABS: 650.0000 mg | ORAL_TABLET | Freq: Four times a day (QID) | ORAL | Status: AC | PRN

## 2024-09-23 MED ORDER — LORAZEPAM 1 MG PO TABS: 1.0000 mg | ORAL_TABLET | ORAL | Status: AC | PRN

## 2024-09-23 MED ORDER — ATROPINE SULFATE 1 % OP SOLN: 2.0000 [drp] | Freq: Four times a day (QID) | OPHTHALMIC | 0 refills | Status: AC | PRN

## 2024-09-23 MED ORDER — HALOPERIDOL LACTATE 2 MG/ML PO CONC
0.6000 mg | ORAL | 0 refills | Status: AC | PRN
  Filled 2024-09-23: qty 15, 9d supply, fill #0

## 2024-09-23 MED ORDER — LORAZEPAM 2 MG/ML PO CONC
1.0000 mg | ORAL | 0 refills | Status: DC | PRN
  Filled 2024-09-23: qty 30, 10d supply, fill #0

## 2024-09-23 MED ORDER — SARNA 0.5-0.5 % EX LOTN
1.0000 | TOPICAL_LOTION | CUTANEOUS | 0 refills | Status: DC | PRN
  Filled 2024-09-23: qty 222, fill #0

## 2024-09-23 MED ORDER — GLYCOPYRROLATE 1 MG PO TABS: 1.0000 mg | ORAL_TABLET | ORAL | Status: AC | PRN

## 2024-09-23 MED ORDER — ATROPINE SULFATE 1 % OP SOLN
2.0000 [drp] | Freq: Four times a day (QID) | OPHTHALMIC | 0 refills | Status: DC | PRN
  Filled 2024-09-23: qty 2, 5d supply, fill #0

## 2024-09-23 MED ORDER — ONDANSETRON 4 MG PO TBDP
4.0000 mg | ORAL_TABLET | Freq: Four times a day (QID) | ORAL | 0 refills | Status: AC | PRN
  Filled 2024-09-23: qty 20, 5d supply, fill #0

## 2024-09-23 MED ORDER — VITAMIN K1 10 MG/ML IJ SOLN
1.0000 mg | Freq: Once | INTRAVENOUS | Status: DC
  Filled 2024-09-23: qty 0.1

## 2024-09-23 MED ORDER — DARBEPOETIN ALFA 60 MCG/0.3ML IJ SOSY
60.0000 ug | PREFILLED_SYRINGE | INTRAMUSCULAR | Status: DC
  Filled 2024-09-23: qty 0.3

## 2024-09-23 MED ORDER — LORAZEPAM 2 MG/ML PO CONC: 1.0000 mg | ORAL | 0 refills | Status: AC | PRN

## 2024-09-23 MED ORDER — POLYVINYL ALCOHOL 1.4 % OP SOLN: 1.0000 [drp] | Freq: Four times a day (QID) | OPHTHALMIC | Status: AC | PRN

## 2024-09-23 MED ORDER — OXYCODONE HCL 5 MG PO TABS: 5.0000 mg | ORAL_TABLET | ORAL | Status: AC | PRN

## 2024-09-23 MED ORDER — LORAZEPAM 2 MG/ML IJ SOLN: 1.0000 mg | INTRAMUSCULAR | Status: AC | PRN

## 2024-09-23 MED ORDER — SARNA 0.5-0.5 % EX LOTN: 1.0000 | TOPICAL_LOTION | CUTANEOUS | 0 refills | Status: AC | PRN

## 2024-09-23 MED ORDER — BIOTENE DRY MOUTH MT LIQD: 15.0000 mL | OROMUCOSAL | Status: AC | PRN

## 2024-09-23 MED ORDER — HALOPERIDOL 0.5 MG PO TABS: 0.5000 mg | ORAL_TABLET | ORAL | Status: AC | PRN

## 2024-09-23 MED ORDER — ONDANSETRON 4 MG PO TBDP: 4.0000 mg | ORAL_TABLET | Freq: Four times a day (QID) | ORAL | Status: AC | PRN

## 2024-09-23 MED ORDER — LORAZEPAM 2 MG/ML PO CONC: 1.0000 mg | ORAL | Status: AC | PRN

## 2024-09-23 MED ORDER — GLYCOPYRROLATE 0.2 MG/ML IJ SOLN: 0.2000 mg | INTRAMUSCULAR | Status: AC | PRN

## 2024-09-23 MED ORDER — OXYCODONE HCL 5 MG/5ML PO SOLN
5.0000 mg | ORAL | 0 refills | Status: AC | PRN
  Filled 2024-09-23: qty 30, 1d supply, fill #0

## 2024-09-23 MED ORDER — HALOPERIDOL LACTATE 5 MG/ML IJ SOLN
0.5000 mg | INTRAMUSCULAR | Status: AC | PRN
  Administered 2024-09-23: 0.5 mg via INTRAVENOUS
  Filled 2024-09-23: qty 1

## 2024-09-23 MED ORDER — LIDOCAINE 5 % EX PTCH
1.0000 | MEDICATED_PATCH | CUTANEOUS | 0 refills | Status: AC
  Filled 2024-09-23: qty 10, 10d supply, fill #0

## 2024-09-23 MED ORDER — ACETAMINOPHEN 650 MG RE SUPP: 650.0000 mg | Freq: Four times a day (QID) | RECTAL | Status: AC | PRN

## 2024-09-23 MED ORDER — HYDROMORPHONE HCL 1 MG/ML IJ SOLN
0.5000 mg | INTRAMUSCULAR | Status: AC | PRN
  Administered 2024-09-23: 0.5 mg via INTRAVENOUS
  Filled 2024-09-23: qty 0.5

## 2024-09-23 MED ORDER — HALOPERIDOL LACTATE 2 MG/ML PO CONC: 0.5000 mg | ORAL | Status: AC | PRN

## 2024-09-23 MED ORDER — ONDANSETRON HCL 4 MG/2ML IJ SOLN
4.0000 mg | Freq: Four times a day (QID) | INTRAMUSCULAR | Status: AC | PRN
  Administered 2024-09-23: 4 mg via INTRAVENOUS
  Filled 2024-09-23: qty 2

## 2024-09-23 NOTE — TOC Progression Note (Signed)
 Transition of Care [facility]) - Progression Note    Patient Details  Name: GIBBS NAUGLE MRN: [id number] Date of Birth: October 04, 1932  [facility]) CM/SW Contact  Inocente GORMAN Kindle, LCSW Phone Number: 09/23/2024, 8:35 AM  Clinical Narrative:    CSW continuing to follow for needs.    Expected Discharge Plan: Skilled Nursing Facility Barriers to Discharge: English As A Second Language Teacher, Continued Medical Work up, SNF Pending bed offer               Expected Discharge Plan and Services In-house Referral: Clinical Social Work   Post Acute Care Choice: Skilled Nursing Facility Living arrangements for the past 2 months: Single Family Home                                       Social Drivers of Health (SDOH) Interventions SDOH Screenings   Food Insecurity: No Food Insecurity (09/13/2024)  Housing: Low Risk (09/13/2024)  Transportation Needs: No Transportation Needs (09/13/2024)  Utilities: Not At Risk (09/13/2024)  Social Connections: Socially Integrated (09/13/2024)  Tobacco Use: Low Risk (09/15/2024)    Readmission Risk Interventions    09/14/2024   12:43 PM  Readmission Risk Prevention Plan  Transportation Screening Complete  PCP or Specialist Appt within 5-7 Days Complete  Home Care Screening Complete  Medication Review (RN CM) Complete

## 2024-09-23 NOTE — Plan of Care (Signed)
" ° ° ° °  Referral previously received for Benjamin [name] for goals of care discussion. Chart reviewed and updates received from RN.  Attempted to see the patient yesterday when consult received, however the patient was in hemodialysis and deferred until today.  Plan to see patient today around 2:00 PM, however by then information indicated that the hospitalist team had already had a conversation with the patient and he would like to discharge today home with hospice.  I messaged the primary hospitalist indicating no further palliative need.  Will cancel consult and sign off.  Thank you for your referral and allowing PMT to assist in Benjamin [name]'s care.   Camellia Kays, NP Palliative Medicine Team Phone: [phone]  NO CHARGE  "

## 2024-09-23 NOTE — TOC Transition Note (Signed)
 Transition of Care [facility]) - Discharge Note   Patient Details  Name: Benjamin [name] MRN: [id number] Date of Birth: 06/29/1933  [facility] CM/SW Contact:  Landry DELENA Senters, RN Phone Number: 09/23/2024, 3:22 PM   Clinical Narrative:     Patient will be discharging home with hospice through [facility] today.   DME has been ordered through [facility] and CM confirmed it has been delivered to home.   Patient will be transported via [facility]. Address confirmed with family. DNR on chart.   No further needs identified by CM.  Final next level of care: Home w Hospice Care Barriers to Discharge: No Barriers Identified   Patient Goals and CMS Choice Patient states their goals for this hospitalization and ongoing recovery are:: Rehab CMS Medicare.gov Compare Post Acute Care list provided to:: Patient Choice offered to / list presented to : Patient [place] ownership interest in [facility].provided to:: Patient    Discharge Placement                       Discharge Plan and Services Additional resources added to the After Visit Summary for   In-house Referral: Clinical Social Work   Post Acute Care Choice: Skilled Nursing Facility                               Social Drivers of Health (SDOH) Interventions SDOH Screenings   Food Insecurity: No Food Insecurity (09/13/2024)  Housing: Low Risk (09/13/2024)  Transportation Needs: No Transportation Needs (09/13/2024)  Utilities: Not At Risk (09/13/2024)  Social Connections: Socially Integrated (09/13/2024)  Tobacco Use: Low Risk (09/15/2024)     Readmission Risk Interventions    09/14/2024   12:43 PM  Readmission Risk Prevention Plan  Transportation Screening Complete  PCP or Specialist Appt within 5-7 Days Complete  Home Care Screening Complete  Medication Review (RN CM) Complete

## 2024-09-23 NOTE — Progress Notes (Signed)
 Pt running 110-120s afib. VSS outside of HR. No distress noted.Pt denies any pain.  MD Tobie notified. No new orders at this time.

## 2024-09-23 NOTE — Progress Notes (Signed)
 Physical Therapy Treatment Patient Details Name: Benjamin [name] MRN: [id number] DOB: [birth year] Today's Date: 09/23/2024   History of Present Illness Pt is a 89 y/o male presenting on 1/26 due to 2 week flu like symptoms, weakness, and decreased oral intake. CT with cholelithiasis and choledocholithiasis, suspicion for acute cholecystitis. CT angio negative PE but showed large iatal hernia. 09/14/24 transferred to [facility], s/p cholecystotomy tube placement. 1/29 s/p ERCP, per radiology failed but placed drainage cath.  PMH includes: thyroid disease, urinary retention, prior back surgery.    PT Comments  Pt received in supine and agreeable to session. Pt slightly more alert this session, but continues to be limited by weakness and fatigue. Pt requires mod-max A +2 for bed mobility and min A for sitting balance. Pt demonstrates R side weakness throughout session. Pt able to complete 1 LAQ with each LE, but unable to perform more due to fatigue and desire to return to supine. Noted pt transitioned to comfort care after session, so PT will sign off.   If plan is discharge home, recommend the following: Two people to help with walking and/or transfers;Two people to help with bathing/dressing/bathroom   Can travel by private vehicle     No  Equipment Recommendations  Other (comment) (TBD)    Recommendations for Other Services       Precautions / Restrictions Precautions Precautions: Fall;Other (comment) Recall of Precautions/Restrictions: Impaired Precaution/Restrictions Comments: 2 drains R abd, foley Restrictions Weight Bearing Restrictions Per Provider Order: No     Mobility  Bed Mobility Overal bed mobility: Needs Assistance Bed Mobility: Supine to Sit, Sit to Supine     Supine to sit: Mod assist, +2 for physical assistance, HOB elevated, Used rails Sit to supine: Max assist, +2 for physical assistance   General bed mobility comments: Pt able to advance LLE better than RLE towards  EOB and elevate trunk with mod A +2 overall. Increased assist to return to supine for BLE and trunk    Transfers                   General transfer comment: did not attempt due to quick fatigue and weakness    Ambulation/Gait                   Stairs             Wheelchair Mobility     Tilt Bed    Modified Rankin (Stroke Patients Only)       Balance Overall balance assessment: Needs assistance Sitting-balance support: Single extremity supported, Feet supported, Bilateral upper extremity supported Sitting balance-Leahy Scale: Poor Sitting balance - Comments: Mostly LUE support with R posterior lean requiring grossly min A to maintain balance                                    Communication Communication Communication: Impaired Factors Affecting Communication: Difficulty expressing self;Reduced clarity of speech  Cognition Arousal: Lethargic Behavior During Therapy: Flat affect   PT - Cognitive impairments: Memory, Sequencing, Problem solving, Orientation, Attention, Awareness                         Following commands: Impaired Following commands impaired: Follows one step commands with increased time, Follows one step commands inconsistently    Cueing Cueing Techniques: Verbal cues, Tactile cues, Visual cues  Exercises      General  Comments General comments (skin integrity, edema, etc.): BP WFL throughout. HR up to 120 with mobility, but improved with rest      Pertinent Vitals/Pain Pain Assessment Pain Assessment: Faces Faces Pain Scale: Hurts a little bit Pain Location: generalized with mobility, pt doesn't specify location Pain Intervention(s): Monitored during session     PT Goals (current goals can now be found in the care plan section) Acute Rehab PT Goals Patient Stated Goal: get stronger PT Goal Formulation: With patient Time For Goal Achievement: 09/30/24 Progress towards PT goals: Progressing  toward goals    Frequency    Min 2X/week      PT Plan      Co-evaluation PT/OT/SLP Co-Evaluation/Treatment: Yes Reason for Co-Treatment: Complexity of the patient's impairments (multi-system involvement);To address functional/ADL transfers;For patient/therapist safety PT goals addressed during session: Mobility/safety with mobility;Balance;Strengthening/ROM OT goals addressed during session: ADL's and self-care      AM-PAC PT 6 Clicks Mobility   Outcome Measure  Help needed turning from your back to your side while in a flat bed without using bedrails?: Total Help needed moving from lying on your back to sitting on the side of a flat bed without using bedrails?: Total Help needed moving to and from a bed to a chair (including a wheelchair)?: Total Help needed standing up from a chair using your arms (e.g., wheelchair or bedside chair)?: Total Help needed to walk in hospital room?: Total Help needed climbing 3-5 steps with a railing? : Total 6 Click Score: 6    End of Session Equipment Utilized During Treatment: Oxygen Activity Tolerance: Patient limited by fatigue;Patient tolerated treatment well Patient left: in bed;with call bell/phone within reach;with bed alarm set;with family/visitor present Nurse Communication: Mobility status PT Visit Diagnosis: Muscle weakness (generalized) (M62.81);Difficulty in walking, not elsewhere classified (R26.2)     Time: 8963-8940 PT Time Calculation (min) (ACUTE ONLY): 23 min  Charges:    $Therapeutic Activity: 8-22 mins PT General Charges $$ ACUTE PT VISIT: 1 Visit                    Darryle George, PTA Acute Rehabilitation Services Secure Chat Preferred  [phone]    Darryle George 09/23/2024, 1:23 PM

## 2024-09-23 NOTE — Progress Notes (Addendum)
 [facility] NEPHROLOGY PROGRESS NOTE  Assessment/ Plan: Pt is a 89 y.o. yo male  with past medical history significant for dyslipidemia, thyroid disease, acid reflux, BPH with urinary retention who was initially presented to [facility] on 1/27 with generalized body pain, weakness, fever, abdominal pain, decreased oral intake, seen as a consultation for AKI.  # Acute kidney injury: Likely ischemic ATN in setting of septic shock.  Baseline creatinine normal.  Urine output remains very minimal.  Poor long-term dialysis candidate given his age.  Remains oliguric.   - Appreciate help from IR with temporary dialysis catheter on 2/3 - Attempted dialysis yesterday but was agitated so trying again today - Reassess for dialysis needs over the weekend -Goals of care as below   # Septic shock due to E. coli bacteremia, off of pressor.  Antibiotics per primary team.     # Cholangitis status post percutaneous cholecystostomy with biliary drain.  ERCP attempted but unsuccessful. Transaminitis improving.  Continue with management per IR   # Acute metabolic encephalopathy due to sepsis.  Status seems to be fluctuating.  # Anemia: Likely multifactorial.  No IV iron for now.  Start ESA  #GOC: phone call on 2/6: Called and discussed the patient's case with his son [name].  [name] says that he and his brother [name] make decisions for their dad together.  He understands his dad is really sick.  He understands there is a lot of complicated factors that have prevented him from getting better.  He is able to see that his dad has a poor appetite and is struggling to improve.  We discussed kidney function is poor and he is continuing to require dialysis.  We also discussed that if he does not improve he would be a poor long-term dialysis candidate.  We will continue with current care but reassess efficacy of dialysis as time goes on.  ADDENDUM: informed by primary team patient wants to go home on hospice. Seems  like a reasonable decision. We will sign off   Subjective: Patient minimally interactive today.  But more awake and alert trying to drink water  working with speech therapy.  Called and discussed the patient's case with his son [name].  [name] says that he and his brother [name] make decisions for their dad together.  He understands his dad is really sick.  He understands there is a lot of complicated factors that have prevented him from getting better.  He is able to see that his dad has a poor appetite and is struggling to improve.  We discussed kidney function is poor and he is continuing to require dialysis.  We also discussed that if he does not improve he would be a poor long-term dialysis candidate.  We will continue with current care but reassess efficacy of dialysis as time goes on.  Objective Vital signs in last 24 hours: Vitals:   09/23/24 0000 09/23/24 0400 09/23/24 0745 09/23/24 0832  BP: 110/60 (!) 102/44 (!) 135/103 (!) 133/51  Pulse: (!) 103 (!) 103 (!) 104 (!) 102  Resp: 16 17 (!) 27 (!) 24  Temp: 98.2 F (36.8 C) 98.1 F (36.7 C) 98.9 F (37.2 C)   TempSrc: Axillary Axillary Oral   SpO2: 100% 100% 100% 100%  Weight:      Height:       Weight change:   Intake/Output Summary (Last 24 hours) at 09/23/2024 1031 Last data filed at 09/23/2024 0746 Gross per 24 hour  Intake 30 ml  Output 215  ml  Net -185 ml       Labs: RENAL PANEL Recent Labs  Lab 09/17/24 0820 09/17/24 0856 09/18/24 0717 09/19/24 0417 09/20/24 0256 09/21/24 0412 09/22/24 0310 09/23/24 0342  NA 134*  --  135 135 134* 133* 135 135  K 4.4  --  4.7 5.0 5.0 5.1 4.4 4.2  CL 98  --  97* 97* 97* 97* 99 100  CO2 27  --  26 21* 23 21* 23 22  GLUCOSE 88  --  65* 61* 91 93 97 122*  BUN 48*  --  56* 63* 70* 78* 57* 62*  CREATININE 3.04*  --  3.69* 4.21* 4.83* 5.42* 4.47* 5.27*  CALCIUM  7.0*  --  7.1* 7.1* 7.4* 7.5* 7.4* 7.2*  MG  --  1.9  --   --   --   --  2.1 2.2  PHOS 2.9  --  3.8 5.2*  --   --   --    --   ALBUMIN  2.6*  --  2.2* 2.2*  2.2* 2.1* 2.2* 2.2* 2.0*    Liver Function Tests: Recent Labs  Lab 09/21/24 0412 09/22/24 0310 09/23/24 0342  AST 119* 87* 79*  ALT 207* 169* 140*  ALKPHOS 126 114 151*  BILITOT 0.5 0.4 0.4  PROT 4.5* 4.3* 4.1*  ALBUMIN  2.2* 2.2* 2.0*   No results for input(s): LIPASE, AMYLASE in the last 168 hours.  No results for input(s): AMMONIA in the last 168 hours. CBC: Recent Labs    09/19/24 0417 09/20/24 0256 09/21/24 0412 09/22/24 0311 09/23/24 0342  HGB 10.8* 10.8* 10.6* 9.5* 7.7*  MCV 86.7 89.6 90.1 88.6 89.8  VITAMINB12  --  >4,000*  --   --   --   FOLATE  --  11.1  --   --   --   FERRITIN  --  418*  --   --   --   TIBC  --  113*  --   --   --   IRON  --  58  --   --   --   RETICCTPCT  --  1.6  --   --   --     Cardiac Enzymes: No results for input(s): CKTOTAL, CKMB, CKMBINDEX, TROPONINI in the last 168 hours. CBG: Recent Labs  Lab 09/22/24 1402 09/22/24 1740 09/22/24 2054 09/23/24 0010 09/23/24 0632  GLUCAP 135* 117* 122* 128* 116*    Iron Studies:  No results for input(s): IRON, TIBC, TRANSFERRIN, FERRITIN in the last 72 hours.  Studies/Results: DG CHEST PORT 1 VIEW Result Date: 09/22/2024 EXAM: 1 VIEW XRAY OF THE CHEST 09/22/2024 07:24:00 PM COMPARISON: Comparison with 09/15/2024. CLINICAL HISTORY: [id number] Biliary drain displacement, sequela [id number] Biliary drain displacement, sequela. FINDINGS: LINES, TUBES AND DEVICES: Left IJ CVC tip in the right atrium. Right IJ CVC tip in the right atrium. Partially visualized right upper quadrant drain. LUNGS AND PLEURA: Moderate bilateral pleural effusions and basilar airspace opacities. No pneumothorax. HEART AND MEDIASTINUM: Cardiomegaly. No acute abnormality of the mediastinal silhouette. Large hiatal hernia. BONES AND SOFT TISSUES: No acute osseous abnormality. IMPRESSION: 1. Bilateral IJ central venous catheters with tips in the right atrium. 2. Moderate  bilateral pleural effusions and basilar airspace opacities. 3. Large hiatal hernia. 4. Partially visualized right upper quadrant drain. Electronically signed by: Norman Gatlin MD 09/22/2024 07:31 PM EST RP Workstation: [id number]   CT ABDOMEN WO CONTRAST Result Date: 09/21/2024 CLINICAL DATA:  Biliary drain evaluation EXAM: CT ABDOMEN WITHOUT CONTRAST TECHNIQUE:  Multidetector CT imaging of the abdomen was performed following the standard protocol without IV contrast. RADIATION DOSE REDUCTION: This exam was performed according to the departmental dose-optimization program which includes automated exposure control, adjustment of the mA and/or kV according to patient size and/or use of iterative reconstruction technique. COMPARISON:  09/13/2024, 09/21/2024 FINDINGS: Lower chest: There are large bilateral pleural effusions with lower lobe compressive atelectasis, right greater than left. Large hiatal hernia is again noted. Hepatobiliary: A percutaneous cholecystostomy tube is identified, with interval decompression of the gallbladder. There is persistent gallbladder wall thickening and small calcified gallstones. Small amount of gas within the gallbladder lumen consistent with indwelling drain. There is a percutaneous trans hepatic biliary drain. The proximal aspect of this drain traverses the pleural space within the right lateral costophrenic angle. The distal aspect is coiled within the region of the dilated common bile duct seen previously. The distal tip of the catheter does not appear to extend into the duodenal lumen. There is mild residual intrahepatic biliary duct dilation, with marked improvement since prior study. Small amount of pneumobilia is seen consistent with recent catheter placement. Pancreas: Unremarkable unenhanced appearance. Spleen: Unremarkable unenhanced appearance. Adrenals/Urinary Tract: The kidneys and adrenals are stable in appearance. Stomach/Bowel: No bowel obstruction or ileus.  Scattered colonic diverticulosis without diverticulitis. No bowel wall thickening or inflammatory change. Large hiatal hernia. Vascular/Lymphatic: Diffuse atherosclerosis of the aorta and its branches. No pathologic adenopathy. Other: Trace free fluid within the bilateral paracolic gutters, left greater than right. No free intraperitoneal gas. No abdominal wall hernia. Musculoskeletal: No acute or destructive bony abnormalities. Reconstructed images demonstrate no additional findings. IMPRESSION: 1. Percutaneous transhepatic biliary catheter, which traverses the pleural space at the right lateral costophrenic angle. The distal aspect is likely coiled within the downstream common bile duct, as it is not clearly within the duodenal lumen on reconstructed images. There has been significant interval decompression of the biliary system, with only minimal residual intrahepatic biliary duct dilation. 2. Percutaneous cholecystostomy tube, with marked decompression of the gallbladder. Persistent findings of cholecystitis and cholelithiasis. 3. Large bilateral pleural effusions and bilateral lower lobe compressive atelectasis, right greater than left. 4. Small volume ascites. 5.  Aortic Atherosclerosis (ICD10-I70.0). 6. Large hiatal hernia. Electronically Signed   By: Ozell Daring M.D.   On: 09/21/2024 18:11   IR CHOLANGIOGRAM EXISTING TUBE Result Date: 09/21/2024 CLINICAL DATA:  89 year old male with history of biliary obstruction status post internal external biliary drain placement on 09/15/2024. EXAM: Biliary drain check COMPARISON:  09/15/2024 CONTRAST:  10 mL Omnipaque  300-administered via the existing percutaneous drain. FLUOROSCOPY TIME:  Five mGy reference air kerma TECHNIQUE: The patient was positioned supine on the fluoroscopy table. A preprocedural spot fluoroscopic image was obtained of the right upper quadrant and the existing percutaneous drainage catheter. Multiple spot fluoroscopic and radiographic images  were obtained following the injection of a small amount of contrast via the existing percutaneous drainage catheter. The drain was placed back to bag drainage. Sterile bandage was applied. FINDINGS: Suspected trans pleural peripheral coarse of indwelling drain. The pigtail portion appears to be within the second portion of the duodenum. Contrast injection demonstrates patency of the peripheral aspect of the tube without passage of contrast beyond the pigtail portion. There is persistent mild dilation of the common bile duct which appears occluded at the ampulla. Interval development of moderate right pleural effusion. IMPRESSION: Suspected transpleural transgression of indwelling right-sided internal external biliary drainage catheter which is occluded distally. PLAN: Obtain CT abdomen to delineate exact  course of indwelling drain. Interventional Radiology will arrange for biliary drain exchange versus new placement tomorrow. Ester Sides, MD [facility] Radiology Electronically Signed   By: Ester Sides M.D.   On: 09/21/2024 16:19     Medications: Infusions:  cefTRIAXone  (ROCEPHIN )  IV 2 g (09/22/24 1347)   chlorproMAZINE  (THORAZINE ) 12.5 mg in sodium chloride  0.9 % 25 mL IVPB 12.5 mg (09/22/24 2130)   dextrose  40 mL/hr at 09/22/24 1453   metronidazole  500 mg (09/23/24 0835)    Scheduled Medications:  Chlorhexidine  Gluconate Cloth  6 each Topical Daily   Chlorhexidine  Gluconate Cloth  6 each Topical Q0600   darbepoetin (ARANESP ) injection - DIALYSIS  60 mcg Subcutaneous Q Fri-1800   levothyroxine   12.5 mcg Intravenous Daily   midodrine   5 mg Oral TID WC   pantoprazole   40 mg Oral Daily   sodium chloride  flush  5 mL Intracatheter Q8H   sodium chloride  flush  5 mL Intracatheter Q8H   tamsulosin   0.4 mg Oral Daily    have reviewed scheduled and prn medications.  Physical Exam: General:NAD, comfortable, poor dentition Heart: Normal rate, no  rub Lungs: Bilateral chest rise no increased work of breathing Abdomen:soft, Non-tender, non-distended Extremities:No edema Neurology: Sleeping but will wake up to voice and touch, more lethargic  Jefferson Jathan Balling 09/23/2024,10:31 AM  LOS: 10 days

## 2024-09-23 NOTE — Discharge Summary (Signed)
 " Physician Discharge Summary   Patient: Benjamin [name] MRN: [id number] DOB: 25-May-1933  Admit date:     09/13/2024  Discharge date: 09/23/24  Discharge Physician: Yetta Blanch  PCP: Pcp, No  Disposition: Home with hospice Recommendations at discharge: Establish care with hospice.   Follow-up Information     [name], MD Follow up in 4 week(s).   Specialty: General Surgery Why: As needed Contact information: [address] [place] [address] [phone]         [facility] Follow up.   Specialty: Hospice and Palliative Medicine Why: To Establish Care Contact information: [address] [place]  [address] [phone]               Hospital Course: Patient is a 89 y.o.  male with history of BPH-presented to [facility] on 1/27 with sepsis due to acute cholecystitis/cholangitis/E. coli bacteremia-unfortunately postadmission-he developed septic shock and was transferred to [facility] and ICU.  Patient was then evaluated by GI/CCS/IR-underwent cholecystostomy tube placement-GI attempted ERCP but was unsuccessful-and subsequently required biliary drain placement by IR.  Unfortunately further hospital course was complicated by development of AKI   Significant events: 1/27>> admit to [facility] at [facility]-sepsis due to cholecystitis/cholangitis-post admission-progressed to septic shock 1/28>> transferred to ICU at [facility].  IR placed cholecystostomy tube. 1/29>> attempted ERCP by GI-unsuccessful due to duodenal diverticula-evaluated by IR-and underwent trans hepatic internal/external CBD drain placement. 1/30>> nephrology consulted for worsening AKI 2/2>> transferred to [facility] 2/3>> worsening AKI-oliguric-discussed with nephrology-keep n.p.o.-possible temporary HD catheter placement  2/5 underwent thoracentesis on right as well as removal of the biliary drain with placement of a new drain.   Significant studies: 1/27>> CTA chest: No PE-large hiatal  hernia. 1/27>> CT abdomen/pelvis: Acute cholecystitis-cholelithiasis with choledocholithiasis-intrahepatic/extrahepatic biliary ductal dilation- 1/28>> RUQ ultrasound: Distended gallbladder with stones/sludge. 1/30>> echo: EF 45%   Significant microbiology data: 1/27>> COVID/influenza/RSV PCR: Negative 1/27>> blood culture: E. coli 1/28>> bile culture: E. coli, Klebsiella pneumoniae, Bacteroides   Procedures: See above   Consults: IR CCS GI PCCM   Assessment and plan: Septic shock secondary to E. coli bacteremia due to acute calculous cholecystitis-acute cholangitis-in the setting of choledocholithiasis Sepsis physiology rapidly improving S/p cholecystostomy and drain on 1/28-and s/p internal/external biliary drain on 1/29 (ERCP unsuccessful due to duodenal diverticulum) Remains on IV Rocephin /Flagyl  General surgery has signed off 2/2-Will need outpatient follow-up with general surgery for elective cholecystectomy IR following for drain care.  Biliary drain was traversing from pleural space. Went for drain exchange.   Reported hiccups as well as abdominal pain after the procedure.   Acute renal failure. Multifactorial-likely ischemic ATN and contrast-induced nephropathy Remains oliguric-worsening renal function Nephrology consulted. Patient was started on hemodialysis. On 2/8 patient has decided that he does not not want any further hemodialysis and wants to stop any procedures or intervention. His goal remains to go home. Hemodialysis catheter was removed before discharge. Discussed with nephrology.   Thrombocytopenia Likely secondary to sepsis/gram-negative bacteremia   Normocytic anemia Secondary to critical illness/worsening AKI No evidence of GI bleeding. On 2/6 hemoglobin dropped significantly from 9.5 on 2/5-7.7.  This was confirmed with a repeat hemoglobin of 7.6. No external bleeding reported.  Reticulocyte count minimally elevated.  INR elevated. Coombs  negative. Fibrinogen normal. Suspect that he may have sustained some bleeding after his recent drain exchange procedure. Initial plan was to discuss with the family with regards to further workup including CT scan of the chest and abdomen to look for internal  bleeding. Given that the patient has mentioned clearly that his desire is to go home with stopping hemodialysis and eventual now plan is to go home with hospice further workup is canceled. Regardless of findings on the CT scan prognosis would have been guarded.   PAF with RVR Acute in the ICU Maintaining sinus rhythm Echo with slightly suppressed EF TSH elevated (19.9) Anticoagulation not started-due to thrombocytopenia and need for recurrent procedures. Platelet counts are now improving.   Transaminitis Secondary to cholecystitis/cholangitis-possible shock liver Downtrending   History of BPH Currently has Foley catheter in place Continue Flomax .    History of hypothyroidism Noncompliant with thyroid medication. TSH elevated. Was on IV Synthroid .   Debility/deconditioning Secondary to acute/critical illness. PT/OT eval-SNF recommended   Goals of care conversation. Palliative care consult placed. Discussed with son at bedside. He will discuss with his brother. Offered to have a family meeting as well. Patient's prognosis is very poor especially in the setting of cardiac arrest. In the past patient has discussed desire for remaining full code.  2/6.  Patient developed new onset anemia.  Appeared more tired with tachycardia.  Family reported that he had more abdominal pain.  Patient's 2 son [name] and [name] as well as daughter-in-law were at bedside.  Patient reported to them that he wants to stop hemodialysis and wants to go home.  Patient verbalized understanding that stopping hemodialysis would mean that he will die. With this clear instructions on the patient I discussed with the family.  I informed them that pt's  prognosis is already poor.  He has acute renal failure in the setting of septic shock with ongoing confusion/lethargy and poor p.o. intake.  Appears to have developed anemia likely from possible acute blood loss and overall doing poor.  As I have been discussing with the rest of the family members for last few days recommended to consider DNR and now that patient's desire is to stop hemodialysis recommended to consider hospice. Given patient's critical condition, anticipate a narrow window for visiting home to meet family members.  This was discussed with the patient and the family and they agreed to transition to complete comfort in the hospital and goal to go home with hospice.  Daughter-in-law already works with [facility] and is very well aware of hospice policies and protocols.  DME will be arranged and patient will be discharged home with hospice.   Dysphagia. SLP reconsulted. Currently NPO. Started on D10.  Continued while patient was awaiting transport to home.   Poor p.o. intake with adult failure to thrive. Placing the patient at a high risk for poor outcome.   Family Communication: Plan was discussed with Family. Opportunity was given to ask question and all questions were answered satisfactorily.   DISCHARGE MEDICATION: Allergies as of 09/23/2024   No Known Allergies      Medication List     TAKE these medications    atropine  1 % ophthalmic solution Place 2 drops under the tongue every 6 (six) hours as needed (Excessive secretion).   haloperidol  2 MG/ML solution Commonly known as: HALDOL  Place 0.3 mLs (0.6 mg total) under the tongue every 4 (four) hours as needed for agitation (or delirium).   lidocaine  5 % Commonly known as: Lidoderm  Place 1 patch onto the skin daily for 10 days. Remove & Discard patch within 12 hours or as directed by MD   LORazepam  2 MG/ML concentrated solution Commonly known as: ATIVAN  Place 0.5 mLs (1 mg total) under the tongue every 4 (  four)  hours as needed for anxiety.   ondansetron  4 MG disintegrating tablet Commonly known as: ZOFRAN -ODT Take 1 tablet (4 mg total) by mouth every 6 (six) hours as needed for nausea.   oxyCODONE  5 MG/5ML solution Commonly known as: ROXICODONE  Take 5-10 mLs (5-10 mg total) by mouth every 4 (four) hours as needed for severe pain (pain score 7-10) or moderate pain (pain score 4-6) (Respiratory distress).   Sarna lotion Generic drug: camphor-menthol  Apply 1 Application topically as needed for itching.        Diet recommendation: Dysphagia 1 diet.  For comfort.  Discharge Exam: Vitals:   09/23/24 1050 09/23/24 1100 09/23/24 1157 09/23/24 1200  BP: 129/62  (!) 128/95 (!) 122/54  Pulse: 100 (!) 103 (!) 106 (!) 102  Resp: (!) 23 19 18 19   Temp:   97.7 F (36.5 C)   TempSrc:   Oral   SpO2: 100% 100% 100% 100%  Weight:      Height:        Filed Weights   09/15/24 1134 09/22/24 1500 09/22/24 1544  Weight: 57.9 kg 70 kg 70.3 kg   Alert.  Oriented to self and place. Able to follow commands. Oral mucosa dry. Initially had some left sided ptosis which resolved upon repeat evaluation. Abdominal tenderness reported on repeat evaluation as well. Improving edema.  Condition at discharge: stable  The results of significant diagnostics from this hospitalization (including imaging, microbiology, ancillary and laboratory) are listed below for reference.   Imaging Studies: IR THORACENTESIS RIGHT ASP PLEURAL SPACE W/IMG GUIDE Result Date: 09/23/2024 INDICATION: Indwelling right hepatic lobe internal/external biliary drain crosses the pleural space in a patient with increasing right-sided pleural fluid most consistent with biliary effusion. Planned new transhepatic biliary drainage catheter placement with cholangiogram, removal of indwelling transhepatic biliary drain, and right-sided thoracentesis. EXAM: Thoracentesis Transhepatic biliary drain placement Transhepatic biliary drain removal  MEDICATIONS: Patient currently on antibiotics in the hospital setting. ANESTHESIA/SEDATION: Moderate (conscious) sedation was employed during this procedure. A total of Versed  3.5 mg and Fentanyl  125 mcg was administered intravenously by the radiology nurse. Total intra-service moderate Sedation Time: 100 minutes. The patient's level of consciousness and vital signs were monitored continuously by radiology nursing throughout the procedure under my direct supervision. FLUOROSCOPY: Radiation Exposure Index (as provided by the fluoroscopic device): 363 mGy Kerma COMPLICATIONS: None immediate. PROCEDURE: Informed written consent was obtained from the patient after a thorough discussion of the procedural risks, benefits and alternatives. All questions were addressed. Maximal Sterile Barrier Technique was utilized including caps, mask, sterile gowns, sterile gloves, sterile drape, hand hygiene and skin antiseptic. A timeout was performed prior to the initiation of the procedure. With the patient in a right anterior oblique position on the IR table, right upper quadrant was evaluated with ultrasound. The patient's skin was then marked, prepped, and draped in usual sterile fashion. Local anesthesia was achieved with 1% lidocaine . Initially, dilute contrast was injected into the pre-existing transhepatic biliary drain demonstrating bile ducts and common bile duct filling with contrast, however not sufficient to increase access probability for new placement of biliary drain. Ultrasound was then used to advance the percutaneous needle into the liver parenchyma until an intraparenchymal bile duct was identified. At this point dilute contrast was injected through the needle demonstrating filling of the biliary tree. An 018 guidewire was then advanced under fluoroscopic guidance into the common bile duct. The needle was retrieved and exchanged for an access sheath. Sheath was then removed for an 035  guidewire and catheter which  were then advanced into the distal common bile duct. Catheter manipulation under fluoroscopy allowed the guidewire and catheter to be advanced into the duodenal diverticulum and then subsequently into the duodenal. Access was then exchanged for an Amplatz wire. The redundant loop wire loop within the duodenal was reduced. Access was then dilated and an 8 French transhepatic biliary drain with extra sideholes was advanced into the access site and locked in position within the duodenum. The previous indwelling right-sided transhepatic drain was removed. Under fluoroscopic guidance Ultrasound was then used to evaluate the right pleural space. After anesthetizing the skin with 1% lidocaine  a 7 cm Yueh needle was then advanced into the pleural cavity on the right side. The needle was then removed leaving the sheath behind. Drainage was not satisfactory and therefore a guidewire was advanced under fluoroscopic guidance demonstrating intra pleural position on the right side. Access was exchanged for an 8 French pigtail catheter which was then used to drain 500 mL of serosanguineous fluid. At the end of the procedure the catheter was removed. Sterile dressing applied. IMPRESSION: One successful removal of indwelling internal/external biliary drain. Placement of a new 8 French transhepatic biliary drain with extra sideholes. Satisfactory removal of 500 mL of right-sided serosanguineous pleural fluid. Electronically Signed   By: Cordella Banner   On: 09/23/2024 12:11   IR BILIARY DRAIN PLACEMENT WITH CHOLANGIOGRAM Result Date: 09/23/2024 INDICATION: Indwelling right hepatic lobe internal/external biliary drain crosses the pleural space in a patient with increasing right-sided pleural fluid most consistent with biliary effusion. Planned new transhepatic biliary drainage catheter placement with cholangiogram, removal of indwelling transhepatic biliary drain, and right-sided thoracentesis. EXAM: Thoracentesis Transhepatic  biliary drain placement Transhepatic biliary drain removal MEDICATIONS: Patient currently on antibiotics in the hospital setting. ANESTHESIA/SEDATION: Moderate (conscious) sedation was employed during this procedure. A total of Versed  3.5 mg and Fentanyl  125 mcg was administered intravenously by the radiology nurse. Total intra-service moderate Sedation Time: 100 minutes. The patient's level of consciousness and vital signs were monitored continuously by radiology nursing throughout the procedure under my direct supervision. FLUOROSCOPY: Radiation Exposure Index (as provided by the fluoroscopic device): 363 mGy Kerma COMPLICATIONS: None immediate. PROCEDURE: Informed written consent was obtained from the patient after a thorough discussion of the procedural risks, benefits and alternatives. All questions were addressed. Maximal Sterile Barrier Technique was utilized including caps, mask, sterile gowns, sterile gloves, sterile drape, hand hygiene and skin antiseptic. A timeout was performed prior to the initiation of the procedure. With the patient in a right anterior oblique position on the IR table, right upper quadrant was evaluated with ultrasound. The patient's skin was then marked, prepped, and draped in usual sterile fashion. Local anesthesia was achieved with 1% lidocaine . Initially, dilute contrast was injected into the pre-existing transhepatic biliary drain demonstrating bile ducts and common bile duct filling with contrast, however not sufficient to increase access probability for new placement of biliary drain. Ultrasound was then used to advance the percutaneous needle into the liver parenchyma until an intraparenchymal bile duct was identified. At this point dilute contrast was injected through the needle demonstrating filling of the biliary tree. An 018 guidewire was then advanced under fluoroscopic guidance into the common bile duct. The needle was retrieved and exchanged for an access sheath. Sheath  was then removed for an 035 guidewire and catheter which were then advanced into the distal common bile duct. Catheter manipulation under fluoroscopy allowed the guidewire and catheter to be advanced into the  duodenal diverticulum and then subsequently into the duodenal. Access was then exchanged for an Amplatz wire. The redundant loop wire loop within the duodenal was reduced. Access was then dilated and an 8 French transhepatic biliary drain with extra sideholes was advanced into the access site and locked in position within the duodenum. The previous indwelling right-sided transhepatic drain was removed. Under fluoroscopic guidance Ultrasound was then used to evaluate the right pleural space. After anesthetizing the skin with 1% lidocaine  a 7 cm Yueh needle was then advanced into the pleural cavity on the right side. The needle was then removed leaving the sheath behind. Drainage was not satisfactory and therefore a guidewire was advanced under fluoroscopic guidance demonstrating intra pleural position on the right side. Access was exchanged for an 8 French pigtail catheter which was then used to drain 500 mL of serosanguineous fluid. At the end of the procedure the catheter was removed. Sterile dressing applied. IMPRESSION: One successful removal of indwelling internal/external biliary drain. Placement of a new 8 French transhepatic biliary drain with extra sideholes. Satisfactory removal of 500 mL of right-sided serosanguineous pleural fluid. Electronically Signed   By: Cordella Banner   On: 09/23/2024 12:11   IR REMOVAL BILIARY DRAIN Result Date: 09/23/2024 INDICATION: Indwelling right hepatic lobe internal/external biliary drain crosses the pleural space in a patient with increasing right-sided pleural fluid most consistent with biliary effusion. Planned new transhepatic biliary drainage catheter placement with cholangiogram, removal of indwelling transhepatic biliary drain, and right-sided thoracentesis.  EXAM: Thoracentesis Transhepatic biliary drain placement Transhepatic biliary drain removal MEDICATIONS: Patient currently on antibiotics in the hospital setting. ANESTHESIA/SEDATION: Moderate (conscious) sedation was employed during this procedure. A total of Versed  3.5 mg and Fentanyl  125 mcg was administered intravenously by the radiology nurse. Total intra-service moderate Sedation Time: 100 minutes. The patient's level of consciousness and vital signs were monitored continuously by radiology nursing throughout the procedure under my direct supervision. FLUOROSCOPY: Radiation Exposure Index (as provided by the fluoroscopic device): 363 mGy Kerma COMPLICATIONS: None immediate. PROCEDURE: Informed written consent was obtained from the patient after a thorough discussion of the procedural risks, benefits and alternatives. All questions were addressed. Maximal Sterile Barrier Technique was utilized including caps, mask, sterile gowns, sterile gloves, sterile drape, hand hygiene and skin antiseptic. A timeout was performed prior to the initiation of the procedure. With the patient in a right anterior oblique position on the IR table, right upper quadrant was evaluated with ultrasound. The patient's skin was then marked, prepped, and draped in usual sterile fashion. Local anesthesia was achieved with 1% lidocaine . Initially, dilute contrast was injected into the pre-existing transhepatic biliary drain demonstrating bile ducts and common bile duct filling with contrast, however not sufficient to increase access probability for new placement of biliary drain. Ultrasound was then used to advance the percutaneous needle into the liver parenchyma until an intraparenchymal bile duct was identified. At this point dilute contrast was injected through the needle demonstrating filling of the biliary tree. An 018 guidewire was then advanced under fluoroscopic guidance into the common bile duct. The needle was retrieved and  exchanged for an access sheath. Sheath was then removed for an 035 guidewire and catheter which were then advanced into the distal common bile duct. Catheter manipulation under fluoroscopy allowed the guidewire and catheter to be advanced into the duodenal diverticulum and then subsequently into the duodenal. Access was then exchanged for an Amplatz wire. The redundant loop wire loop within the duodenal was reduced. Access was then dilated  and an 8 French transhepatic biliary drain with extra sideholes was advanced into the access site and locked in position within the duodenum. The previous indwelling right-sided transhepatic drain was removed. Under fluoroscopic guidance Ultrasound was then used to evaluate the right pleural space. After anesthetizing the skin with 1% lidocaine  a 7 cm Yueh needle was then advanced into the pleural cavity on the right side. The needle was then removed leaving the sheath behind. Drainage was not satisfactory and therefore a guidewire was advanced under fluoroscopic guidance demonstrating intra pleural position on the right side. Access was exchanged for an 8 French pigtail catheter which was then used to drain 500 mL of serosanguineous fluid. At the end of the procedure the catheter was removed. Sterile dressing applied. IMPRESSION: One successful removal of indwelling internal/external biliary drain. Placement of a new 8 French transhepatic biliary drain with extra sideholes. Satisfactory removal of 500 mL of right-sided serosanguineous pleural fluid. Electronically Signed   By: Cordella Banner   On: 09/23/2024 12:11   DG CHEST PORT 1 VIEW Result Date: 09/22/2024 EXAM: 1 VIEW XRAY OF THE CHEST 09/22/2024 07:24:00 PM COMPARISON: Comparison with 09/15/2024. CLINICAL HISTORY: [id number] Biliary drain displacement, sequela [id number] Biliary drain displacement, sequela. FINDINGS: LINES, TUBES AND DEVICES: Left IJ CVC tip in the right atrium. Right IJ CVC tip in the right atrium. Partially  visualized right upper quadrant drain. LUNGS AND PLEURA: Moderate bilateral pleural effusions and basilar airspace opacities. No pneumothorax. HEART AND MEDIASTINUM: Cardiomegaly. No acute abnormality of the mediastinal silhouette. Large hiatal hernia. BONES AND SOFT TISSUES: No acute osseous abnormality. IMPRESSION: 1. Bilateral IJ central venous catheters with tips in the right atrium. 2. Moderate bilateral pleural effusions and basilar airspace opacities. 3. Large hiatal hernia. 4. Partially visualized right upper quadrant drain. Electronically signed by: Norman Gatlin MD 09/22/2024 07:31 PM EST RP Workstation: [id number]   CT ABDOMEN WO CONTRAST Result Date: 09/21/2024 CLINICAL DATA:  Biliary drain evaluation EXAM: CT ABDOMEN WITHOUT CONTRAST TECHNIQUE: Multidetector CT imaging of the abdomen was performed following the standard protocol without IV contrast. RADIATION DOSE REDUCTION: This exam was performed according to the departmental dose-optimization program which includes automated exposure control, adjustment of the mA and/or kV according to patient size and/or use of iterative reconstruction technique. COMPARISON:  09/13/2024, 09/21/2024 FINDINGS: Lower chest: There are large bilateral pleural effusions with lower lobe compressive atelectasis, right greater than left. Large hiatal hernia is again noted. Hepatobiliary: A percutaneous cholecystostomy tube is identified, with interval decompression of the gallbladder. There is persistent gallbladder wall thickening and small calcified gallstones. Small amount of gas within the gallbladder lumen consistent with indwelling drain. There is a percutaneous trans hepatic biliary drain. The proximal aspect of this drain traverses the pleural space within the right lateral costophrenic angle. The distal aspect is coiled within the region of the dilated common bile duct seen previously. The distal tip of the catheter does not appear to extend into the duodenal  lumen. There is mild residual intrahepatic biliary duct dilation, with marked improvement since prior study. Small amount of pneumobilia is seen consistent with recent catheter placement. Pancreas: Unremarkable unenhanced appearance. Spleen: Unremarkable unenhanced appearance. Adrenals/Urinary Tract: The kidneys and adrenals are stable in appearance. Stomach/Bowel: No bowel obstruction or ileus. Scattered colonic diverticulosis without diverticulitis. No bowel wall thickening or inflammatory change. Large hiatal hernia. Vascular/Lymphatic: Diffuse atherosclerosis of the aorta and its branches. No pathologic adenopathy. Other: Trace free fluid within the bilateral paracolic gutters, left greater than right. No free intraperitoneal  gas. No abdominal wall hernia. Musculoskeletal: No acute or destructive bony abnormalities. Reconstructed images demonstrate no additional findings. IMPRESSION: 1. Percutaneous transhepatic biliary catheter, which traverses the pleural space at the right lateral costophrenic angle. The distal aspect is likely coiled within the downstream common bile duct, as it is not clearly within the duodenal lumen on reconstructed images. There has been significant interval decompression of the biliary system, with only minimal residual intrahepatic biliary duct dilation. 2. Percutaneous cholecystostomy tube, with marked decompression of the gallbladder. Persistent findings of cholecystitis and cholelithiasis. 3. Large bilateral pleural effusions and bilateral lower lobe compressive atelectasis, right greater than left. 4. Small volume ascites. 5.  Aortic Atherosclerosis (ICD10-I70.0). 6. Large hiatal hernia. Electronically Signed   By: Ozell Daring M.D.   On: 09/21/2024 18:11   IR CHOLANGIOGRAM EXISTING TUBE Result Date: 09/21/2024 CLINICAL DATA:  89 year old male with history of biliary obstruction status post internal external biliary drain placement on 09/15/2024. EXAM: Biliary drain check  COMPARISON:  09/15/2024 CONTRAST:  10 mL Omnipaque  300-administered via the existing percutaneous drain. FLUOROSCOPY TIME:  Five mGy reference air kerma TECHNIQUE: The patient was positioned supine on the fluoroscopy table. A preprocedural spot fluoroscopic image was obtained of the right upper quadrant and the existing percutaneous drainage catheter. Multiple spot fluoroscopic and radiographic images were obtained following the injection of a small amount of contrast via the existing percutaneous drainage catheter. The drain was placed back to bag drainage. Sterile bandage was applied. FINDINGS: Suspected trans pleural peripheral coarse of indwelling drain. The pigtail portion appears to be within the second portion of the duodenum. Contrast injection demonstrates patency of the peripheral aspect of the tube without passage of contrast beyond the pigtail portion. There is persistent mild dilation of the common bile duct which appears occluded at the ampulla. Interval development of moderate right pleural effusion. IMPRESSION: Suspected transpleural transgression of indwelling right-sided internal external biliary drainage catheter which is occluded distally. PLAN: Obtain CT abdomen to delineate exact course of indwelling drain. Interventional Radiology will arrange for biliary drain exchange versus new placement tomorrow. Ester Sides, MD [facility] Radiology Electronically Signed   By: Ester Sides M.D.   On: 09/21/2024 16:19   IR NON-TUNNELED CENTRAL VENOUS CATH [facility] W IMG Result Date: 09/20/2024 INDICATION: ESRD requiring HD initiation. EXAM: NON-TUNNELED CENTRAL VENOUS HEMODIALYSIS CATHETER PLACEMENT WITH ULTRASOUND AND FLUOROSCOPIC GUIDANCE COMPARISON:  Chest XR, 09/15/2024 MEDICATIONS: None FLUOROSCOPY: Radiation Exposure Index and estimated peak skin dose (PSD); Reference air kerma (RAK), 0.2 mGy. COMPLICATIONS: None immediate. PROCEDURE: Informed written  consent was obtained from the patient and/or patient's representative after a discussion of the risks, benefits, and alternatives to treatment. Questions regarding the procedure were encouraged and answered. The RIGHT neck and chest were prepped with chlorhexidine  in a sterile fashion, and a sterile drape was applied covering the operative field. Maximum barrier sterile technique with sterile gowns and gloves were used for the procedure. A timeout was performed prior to the initiation of the procedure. After the overlying soft tissues were anesthetized, a small venotomy incision was created and a micropuncture kit was utilized to access the internal jugular vein. Real-time ultrasound guidance was utilized for vascular access including the acquisition of a permanent ultrasound image documenting patency of the accessed vessel. The microwire was utilized to measure appropriate catheter length. A stiff glidewire was advanced to the level of the IVC. Under fluoroscopic guidance, the venotomy was serially dilated, ultimately allowing placement of a 20 cm temporary Trialysis  catheter with tip ultimately terminating within the superior aspect of the right atrium. Final catheter positioning was confirmed and documented with a spot radiographic image. The catheter aspirates and flushes normally. The catheter was flushed with appropriate volume heparin  dwells. The catheter exit site was secured with a 2-0 Ethilon retention suture. A dressing was placed. The patient tolerated the procedure well without immediate post procedural complication. IMPRESSION: Successful placement of a RIGHT internal jugular approach 20 cm temporary dialysis catheter. The catheter is ready for immediate use. PLAN: This catheter may be converted to a tunneled dialysis catheter at a later date as indicated. Thom Hall, MD [facility] Radiology Electronically Signed   By: Thom Hall M.D.   On:  09/20/2024 11:44   ECHOCARDIOGRAM COMPLETE Result Date: 09/16/2024    ECHOCARDIOGRAM REPORT   Patient Name:   CLYDE ZARRELLA Date of Exam: 09/16/2024 Medical Rec #:  [id number]        Height:       67.0 in Accession #:    [id number]       Weight:       127.6 lb Date of Birth:  22-Jan-1933        BSA:          1.671 m Patient Age:    89 years         BP:           103/92 mmHg Patient Gender: M                HR:           88 bpm. Exam Location:  Inpatient Procedure: 2D Echo, Cardiac Doppler, Color Doppler and Intracardiac            Opacification Agent (Both Spectral and Color Flow Doppler were            utilized during procedure). Indications:    Shock  History:        Patient has no prior history of Echocardiogram examinations.  Sonographer:    Philomena Daring Referring Phys: [id number] DEWARD ORN HOFFMAN  Sonographer Comments: Image acquisition challenging due to patient body habitus. IMPRESSIONS  1. Left ventricular ejection fraction, by estimation, is 45%. The left ventricle has mildly decreased function. The left ventricle demonstrates global hypokinesis. There is mild concentric left ventricular hypertrophy. Indeterminate diastolic filling due to E-A fusion.  2. Right ventricular systolic function is normal. The right ventricular size is normal. There is normal pulmonary artery systolic pressure.  3. There is no evidence of pericardial effusion.  4. The mitral valve is normal in structure. Trivial mitral valve regurgitation. No evidence of mitral stenosis.  5. The aortic valve is calcified. Aortic valve regurgitation is mild. No aortic stenosis is present.  6. The inferior vena cava is normal in size with greater than 50% respiratory variability, suggesting right atrial pressure of 3 mmHg. Comparison(s): No prior Echocardiogram. Conclusion(s)/Recommendation(s): Mildly reduced LVEF. FINDINGS  Left Ventricle: Left ventricular ejection fraction, by estimation, is 45%. The left ventricle has mildly decreased function. The  left ventricle demonstrates global hypokinesis. Definity  contrast agent was given IV to delineate the left ventricular endocardial borders. The left ventricular internal cavity size was normal in size. There is mild concentric left ventricular hypertrophy. Indeterminate diastolic filling due to E-A fusion. Right Ventricle: The right ventricular size is normal. No increase in right ventricular wall thickness. Right ventricular systolic function is normal. There is normal pulmonary artery systolic pressure. The tricuspid  regurgitant velocity is 2.71 m/s, and  with an assumed right atrial pressure of 3 mmHg, the estimated right ventricular systolic pressure is 32.4 mmHg. Left Atrium: Left atrial size was normal in size. Right Atrium: Right atrial size was normal in size. Pericardium: There is no evidence of pericardial effusion. Mitral Valve: The mitral valve is normal in structure. Trivial mitral valve regurgitation. No evidence of mitral valve stenosis. Tricuspid Valve: The tricuspid valve is normal in structure. Tricuspid valve regurgitation is trivial. No evidence of tricuspid stenosis. Aortic Valve: The aortic valve is calcified. Aortic valve regurgitation is mild. No aortic stenosis is present. Pulmonic Valve: The pulmonic valve was normal in structure. Pulmonic valve regurgitation is not visualized. No evidence of pulmonic stenosis. Aorta: The aortic root and ascending aorta are structurally normal, with no evidence of dilitation. Venous: The inferior vena cava is normal in size with greater than 50% respiratory variability, suggesting right atrial pressure of 3 mmHg. IAS/Shunts: No atrial level shunt detected by color flow Doppler.  LEFT VENTRICLE PLAX 2D LVIDd:         4.20 cm   Diastology LVIDs:         3.30 cm   LV e' medial:  5.44 cm/s LV PW:         1.10 cm   LV e' lateral: 5.44 cm/s LV IVS:        1.30 cm LVOT diam:     2.39 cm LV SV:         60 LV SV Index:   36 LVOT Area:     4.49 cm  RIGHT VENTRICLE              IVC RV S prime:     13.10 cm/s  IVC diam: 1.75 cm TAPSE (M-mode): 2.0 cm LEFT ATRIUM             Index        RIGHT ATRIUM           Index LA diam:        2.35 cm 1.41 cm/m   RA Area:     15.30 cm LA Vol (A2C):   39.5 ml 23.64 ml/m  RA Volume:   39.70 ml  23.76 ml/m LA Vol (A4C):   40.9 ml 24.48 ml/m LA Biplane Vol: 40.2 ml 24.06 ml/m  AORTIC VALVE LVOT Vmax:   77.00 cm/s LVOT Vmean:  49.800 cm/s LVOT VTI:    0.134 m  AORTA Ao Root diam: 3.49 cm Ao Asc diam:  3.58 cm TRICUSPID VALVE TR Peak grad:   29.4 mmHg TR Vmax:        271.00 cm/s  SHUNTS Systemic VTI:  0.13 m Systemic Diam: 2.39 cm Georganna Archer Electronically signed by Georganna Archer Signature Date/Time: 09/16/2024/6:06:13 PM    Final    IR BILIARY DRAIN PLACEMENT WITH CHOLANGIOGRAM Result Date: 09/16/2024 INDICATION: Failed ERCP a patient with ascending cholangitis, elevated liver enzymes, bile duct stones. The ERCP failed secondary to the ampulla being in the duodenal diverticulum EXAM: Transhepatic percutaneous cholangiogram with internal/external biliary drain placement MEDICATIONS: 50 mL Omnipaque  300 ANESTHESIA/SEDATION: Moderate (conscious) sedation was employed during this procedure. A total of Versed  1 mg and Fentanyl  50 mcg was administered intravenously by the radiology nurse. Total intra-service moderate Sedation Time: 55 minutes. The patient's level of consciousness and vital signs were monitored continuously by radiology nursing throughout the procedure under my direct supervision. FLUOROSCOPY: Radiation Exposure Index (as provided by the fluoroscopic device): 280 mGy  Kerma COMPLICATIONS: None immediate. PROCEDURE: Informed written consent was obtained from the patient after a thorough discussion of the procedural risks, benefits and alternatives. All questions were addressed. Maximal Sterile Barrier Technique was utilized including caps, mask, sterile gowns, sterile gloves, sterile drape, hand hygiene and skin antiseptic. A  timeout was performed prior to the initiation of the procedure. With the patient in a right anterior oblique position on the IR table, the right upper quadrant was evaluated with ultrasound. Patient's skin was then marked, prepped, and draped in usual sterile fashion. Local anesthesia was achieved with 1% lidocaine . Using a combination of ultrasound and fluoroscopy, percutaneous access was achieved by advancing the needle from the skin through the liver into a intraparenchymal bile duct. Intraductal positioning was verified by injecting contrast under fluoroscopy visualizing contrast flowing in the right lobe hepatic ducts. Access purchase of the needle was suboptimal for catheter placement. This access was used to inject contrast and further achieve better access in a more inferior position within the right upper quadrant. A second needle was then advanced using combination of ultrasound and fluoroscopy until intraductal position was again verified. After the second needle was positioned within the hepatic ducts and a guidewire was advanced to the common bile duct was the initial access retrieved. An 018 guidewire was then advanced into the common bile duct. Contrast can then be seen flowing from the common bile duct into the duodenal diverticulum. Navigating the guidewire into the ampulla, duodenal diverticulum, and then into the duodenal proper was difficult, however this was finally achieved. Access was then exchanged for a stiff Glidewire and Berenstein catheter. Access was then exchanged for an Amplatz guidewire. The Amplatz guidewire was then advanced further into the duodenal and used to reduce the position out of the duodenal diverticulum. Once the guidewire was in a more optimal profile the access site was dilated using an 8 French and then 9 French dilator. A 10 French biliary drain was then advanced over the guidewire and into the duodenal. Contrast was again injected demonstrating satisfactory position  and flow of contrast into the duodenal as well as the common bile duct. Locking mechanism engaged. Retention suture and sterile dressing applied. Catheter was connected to gravity drainage. IMPRESSION: Satisfactory placement of a 10 French internal/external transhepatic biliary drain as described above. Electronically Signed   By: Cordella Banner   On: 09/16/2024 09:58   DG C-Arm 1-60 Min Result Date: 09/15/2024 EXAM: FLUOROSCOPIC IMAGING TECHNIQUE: Fluoroscopy was provided by the radiology department for procedure. Radiologist was not present during examination. RADIATION DOSE INDEX: Reference Air Kerma: 3.69 mGy Fluoroscopy time: 27 seconds. Fluoroscopy images: 7. COMPARISON: None available. CLINICAL HISTORY: acute cholecystitis and ERCP. FINDINGS: Intraoperative fluoroscopic imaging was performed. Several scout films were obtained during an ERCP. By history, the ERCP was unsuccessful. No contrast is visualized. IMPRESSION: 1. Intraoperative fluoroscopic imaging as above. Please refer to the operative report for full details. 2. Unsuccessful ERCP. Electronically signed by: Oneil Devonshire MD 09/15/2024 09:13 PM EST RP Workstation: [id number]   DG CHEST PORT 1 VIEW Result Date: 09/15/2024 CLINICAL DATA:  Hypoxemia. EXAM: PORTABLE CHEST 1 VIEW COMPARISON:  09/14/2024 FINDINGS: An endotracheal tube is 4.1 cm above the carina and appropriately positioned. Left jugular central line with the tip at the superior cavoatrial junction. Negative for a pneumothorax. Slightly increased densities at the medial left lung base with air bronchograms. Mild blunting at the costophrenic angles. Heart size is upper limits of normal but stable. Atherosclerotic calcifications at the aortic arch.  IMPRESSION: 1. Slightly increased densities at the medial left lung base. Findings could represent atelectasis or consolidation. 2. Support apparatuses as described. Negative for pneumothorax. Electronically Signed   By: Juliene Balder M.D.   On:  09/15/2024 14:39   IR Perc Cholecystostomy Result Date: 09/15/2024 INDICATION: Acute cholecystitis. EXAM: ULTRASOUND AND FLUOROSCOPIC-GUIDED CHOLECYSTOSTOMY TUBE PLACEMENT COMPARISON:  US  Abdomen, earlier same day.  CT AP, 09/13/2024. MEDICATIONS: The patient is currently admitted to the hospital and on intravenous antibiotics. Antibiotics were administered within an appropriate time frame prior to skin puncture. ANESTHESIA/SEDATION: Moderate (conscious) sedation was employed during this procedure. A total of Versed  0.5 mg and Fentanyl  25 mcg was administered intravenously. Moderate Sedation Time: 13 minutes. The patient's level of consciousness and vital signs were monitored continuously by radiology nursing throughout the procedure under my direct supervision. CONTRAST:  10mL OMNIPAQUE  IOHEXOL  300 MG/ML SOLN - administered into the gallbladder fossa. FLUOROSCOPY TIME:  Radiation Exposure Index and estimated peak skin dose (PSD); Reference air kerma (RAK), 3.4 mGy. COMPLICATIONS: None immediate. PROCEDURE: Informed written consent was obtained from the patient after a discussion of the risks, benefits and alternatives to treatment. Questions regarding the procedure were encouraged and answered. A timeout was performed prior to the initiation of the procedure. The right upper abdominal quadrant was prepped and draped in the usual sterile fashion, and a sterile drape was applied covering the operative field. Maximum barrier sterile technique with sterile gowns and gloves were used for the procedure. A timeout was performed prior to the initiation of the procedure. Local anesthesia was provided with 1% lidocaine  with epinephrine . Ultrasound scanning of the RIGHT upper quadrant demonstrates a distended gallbladder. Of note, the patient reported pain with ultrasound imaging over the gallbladder. Utilizing a transhepatic approach, a 22 gauge needle was advanced into the gallbladder under direct ultrasound guidance. An  ultrasound image was saved for documentation purposes. Appropriate intraluminal puncture was confirmed with the efflux of bile and advancement of an 0.018 wire into the gallbladder lumen. The needle was exchanged for an Accustick set. A small amount of contrast was injected to confirm appropriate intraluminal positioning. Over a Benson wire, a 10 Fr cholecystomy tube was advanced into the gallbladder fossa, coiled and locked. Bile was aspirated and a small amount of contrast was injected as several post procedural spot radiographic images were obtained in various obliquities. The catheter was secured to the skin with suture, connected to a drainage bag and a dressing was placed. The patient tolerated the procedure well without immediate post procedural complication. IMPRESSION: Successful ultrasound and fluoroscopic guided placement of a 10 Fr cholecystostomy tube. RECOMMENDATIONS: The patient will return to Vascular Interventional Radiology (VIR) for routine drainage catheter evaluation and exchange in 6-8 weeks. Thom Hall, MD [facility] Radiology Electronically Signed   By: Thom Hall M.D.   On: 09/15/2024 06:59   DG CHEST PORT 1 VIEW Result Date: 09/14/2024 CLINICAL DATA:  Central line placement. EXAM: PORTABLE CHEST 1 VIEW COMPARISON:  Chest radiograph dated 09/13/2024. FINDINGS: Left-sided central venous line with tip in the region of the cavoatrial junction. No focal consolidation, pleural effusion or pneumothorax. Stable cardiomegaly. Moderate size hiatal hernia. No acute osseous pathology. IMPRESSION: Left-sided central venous line with tip in the region of the cavoatrial junction. No pneumothorax. Electronically Signed   By: Vanetta Chou M.D.   On: 09/14/2024 17:51   US  Abdomen Limited Result Date: 09/14/2024 CLINICAL DATA:  [id number] Cholecystitis, acute with cholelithiasis [id number] EXAM: ULTRASOUND ABDOMEN LIMITED  RIGHT UPPER QUADRANT COMPARISON:   CT AP, 09/13/2024.  CTA PE, 09/13/2024 FINDINGS: Gallbladder: Distended, with layering echogenic debris and gallstones. Gallbladder wall thickening, measuring greater than the upper limit of normal. POSITIVE pericholecystic fluid. No sonographic Murphy sign noted by sonographer, though patient reportedly on pain medication. Common bile duct: Diameter: Dilated, measuring up to 17 mm of the distal CBD. Liver: No focal lesion identified. Within normal limits in parenchymal echogenicity. Portal vein is patent on color Doppler imaging with normal direction of blood flow towards the liver. Other: No perihepatic free fluid. IMPRESSION: 1. Distended gallbladder with stones, sludge and pericholecystic fluid. Findings favored consistent with acute calculus cholecystitis, despite absence of sonographic Murphy's sign 2. Common bile duct distention, suspicious for choledocholithiasis. Electronically Signed   By: Thom Hall M.D.   On: 09/14/2024 17:30   CT ABDOMEN PELVIS W CONTRAST Addendum Date: 09/13/2024 ** ADDENDUM #2 ** EXAM: CT ABDOMEN AND PELVIS WITH CONTRAST 09/13/2024 09:22:24 PM TECHNIQUE: CT of the abdomen and pelvis was performed with the administration of 100 mL iohexol  (OMNIPAQUE ) 350 MG/ML injection. Multiplanar reformatted images are provided for review. Automated exposure control, iterative reconstruction, and/or weight-based adjustment of the mA/kV was utilized to reduce the radiation dose to as low as reasonably achievable. COMPARISON: 11/10/2016 CLINICAL HISTORY: Abdominal pain, acute, nonlocalized; elevated LFTs. FINDINGS: LOWER CHEST: No acute abnormality. LIVER: There is evidence of intrahepatic biliary ductal dilatation. GALLBLADDER AND BILE DUCTS: The gallbladder is well distended with findings of wall thickening and mild pericholecystic inflammatory change. Dependent gallstones are noted within the gallbladder. There is evidence of extrahepatic biliary ductal dilatation with a stone in the distal  common bile duct. Common bile duct measures up to 14 mm distally. SPLEEN: No acute abnormality. PANCREAS: No acute abnormality. ADRENAL GLANDS: No acute abnormality. KIDNEYS, URETERS AND BLADDER: Kidneys demonstrate a normal enhancement pattern bilaterally. Renal cysts are noted bilaterally. Per consensus, no follow-up is needed for simple Bosniak type 1 and 2 renal cysts, unless the patient has a malignancy history or risk factors. No stones in the kidneys or ureters. No perinephric or periureteral stranding. The bladder is well distended with trabeculation, which may be related to a degree of bladder outlet obstruction. GI AND BOWEL: Inflammatory changes of the duodenum adjacent to the gallbladder are seen. Scattered diverticular change of the colon is noted without diverticulitis. The appendix is not well visualized and may have been surgically removed. No inflammatory changes to suggest appendicitis are seen. The small bowel and stomach are otherwise within normal limits. There is no bowel obstruction. PERITONEUM AND RETROPERITONEUM: No ascites. No free air. VASCULATURE: Aorta is normal in caliber. Aortic calcifications are seen without aneurysmal dilatation. LYMPH NODES: No lymphadenopathy. REPRODUCTIVE ORGANS: The prostate is within normal limits. BONES AND SOFT TISSUES: No acute osseous abnormality. No focal soft tissue abnormality. IMPRESSION: 1. Cholelithiasis and Choledocholithiasis with intrahepatic and extrahepatic biliary ductal dilatation. 2. Findings suspicious for acute cholecystitis. Ultrasound would be helpful for further evaluation. 3. Inflammatory changes of the adjacent duodenum. Due to a technical error an original report was put out at 9:34 . This will serve as the full and final dictation for this CT of the abdomen and pelvis. Electronically signed by: Oneil Devonshire MD 09/13/2024 09:49 PM EST RP Workstation: [id number]   Addendum Date: 09/13/2024 ** ADDENDUM #1 ** EXAM: CT ABDOMEN AND PELVIS  WITH CONTRAST 09/13/2024 09:22:24 PM TECHNIQUE: CT of the abdomen and pelvis was performed with the administration of 100 mL iohexol  (OMNIPAQUE ) 350 MG/ML injection. Multiplanar  reformatted images are provided for review. Automated exposure control, iterative reconstruction, and/or weight-based adjustment of the mA/kV was utilized to reduce the radiation dose to as low as reasonably achievable. COMPARISON: 11/10/2016 CLINICAL HISTORY: Abdominal pain, acute, nonlocalized; elevated LFTs. FINDINGS: LOWER CHEST: No acute abnormality. LIVER: There is evidence of intrahepatic biliary ductal dilatation. GALLBLADDER AND BILE DUCTS: The gallbladder is well distended with findings of wall thickening and mild pericholecystic inflammatory change. Dependent gallstones are noted within the gallbladder. There is evidence of extrahepatic biliary ductal dilatation with a stone in the distal common bile duct. Common bile duct measures up to 14 mm distally. SPLEEN: No acute abnormality. PANCREAS: No acute abnormality. ADRENAL GLANDS: No acute abnormality. KIDNEYS, URETERS AND BLADDER: Kidneys demonstrate a normal enhancement pattern bilaterally. Renal cysts are noted bilaterally. Per consensus, no follow-up is needed for simple Bosniak type 1 and 2 renal cysts, unless the patient has a malignancy history or risk factors. No stones in the kidneys or ureters. No perinephric or periureteral stranding. The bladder is well distended with trabeculation, which may be related to a degree of bladder outlet obstruction. GI AND BOWEL: Inflammatory changes of the duodenum adjacent to the gallbladder are seen. Scattered diverticular change of the colon is noted without diverticulitis. The appendix is not well visualized and may have been surgically removed. No inflammatory changes to suggest appendicitis are seen. The small bowel and stomach are otherwise within normal limits. There is no bowel obstruction. PERITONEUM AND RETROPERITONEUM: No  ascites. No free air. VASCULATURE: Aorta is normal in caliber. Aortic calcifications are seen without aneurysmal dilatation. LYMPH NODES: No lymphadenopathy. REPRODUCTIVE ORGANS: The prostate is within normal limits. BONES AND SOFT TISSUES: No acute osseous abnormality. No focal soft tissue abnormality. IMPRESSION: 1. Cholelithiasis and Choledocholithiasis with intrahepatic and extrahepatic biliary ductal dilatation. 2. Findings suspicious for acute cholecystitis. Ultrasound would be helpful for further evaluation. 3. Inflammatory changes of the adjacent duodenum. Due to a technical error an original report was put out at 9:03 . This will serve as the full and final dictation for this CT of the abdomen and pelvis. Electronically signed by: Oneil Devonshire MD 09/13/2024 09:49 PM EST RP Workstation: [id number]   Result Date: 09/13/2024 ** ORIGINAL REPORT ** EXAM: CT ABDOMEN AND PELVIS WITH CONTRAST 09/13/2024 09:22:24 PM TECHNIQUE: CT of the abdomen and pelvis was performed with the administration of intravenous contrast. Multiplanar reformatted images are provided for review. Automated exposure control, iterative reconstruction, and/or weight-based adjustment of the mA/kV was utilized to reduce the radiation dose to as low as reasonably achievable. COMPARISON: None available. CLINICAL HISTORY: Abdominal pain, acute, nonlocalized; elevated LFTs Abdominal pain, acute, nonlocalized; elevated liver function tests. FINDINGS: LOWER CHEST: No acute abnormality. LIVER: The liver is unremarkable. GALLBLADDER AND BILE DUCTS: Gallbladder is unremarkable. No biliary ductal dilatation. SPLEEN: No acute abnormality. PANCREAS: No acute abnormality. ADRENAL GLANDS: No acute abnormality. KIDNEYS, URETERS AND BLADDER: No stones in the kidneys or ureters. No hydronephrosis. No perinephric or periureteral stranding. Urinary bladder is unremarkable. GI AND BOWEL: Stomach demonstrates no acute abnormality. There is no bowel obstruction.  PERITONEUM AND RETROPERITONEUM: No ascites. No free air. VASCULATURE: Aorta is normal in caliber. LYMPH NODES: No lymphadenopathy. REPRODUCTIVE ORGANS: No acute abnormality. BONES AND SOFT TISSUES: No acute osseous abnormality. No focal soft tissue abnormality. IMPRESSION: 1. No acute findings in the abdomen or pelvis. Electronically signed by: Oneil Devonshire MD 09/13/2024 09:34 PM EST RP Workstation: [id number]   CT Angio Chest PE W/Cm &/Or Wo Cm Result Date: 09/13/2024  EXAM: CTA of the Chest with contrast for PE 09/13/2024 09:22:24 PM TECHNIQUE: CTA of the chest was performed after the administration of 100 mL of iohexol  (OMNIPAQUE ) 350 MG/ML injection. Multiplanar reformatted images are provided for review. MIP images are provided for review. Automated exposure control, iterative reconstruction, and/or weight based adjustment of the mA/kV was utilized to reduce the radiation dose to as low as reasonably achievable. COMPARISON: Plain film from earlier in the same day. CLINICAL HISTORY: Nausea and vomiting and weakness. FINDINGS: PULMONARY ARTERIES: The pulmonary artery shows a normal branching pattern bilaterally. No filling defect to suggest pulmonary embolism is noted. Main pulmonary artery is normal in caliber. MEDIASTINUM: Atherosclerotic calcification of the aorta is noted. No aneurysmal dilatation is seen. Coronary calcifications are noted. The heart and pericardium demonstrate no acute abnormality. LYMPH NODES: No mediastinal, hilar or axillary lymphadenopathy. LUNGS AND PLEURA: The lungs demonstrate mild emphysematous change. Mild atelectatic changes in the right base are seen. No focal infiltrate or pulmonary edema. No pleural effusion or pneumothorax. UPPER ABDOMEN: Large hiatal hernia is seen. SOFT TISSUES AND BONES: No acute bone or soft tissue abnormality. IMPRESSION: 1. No pulmonary embolism. 2. Large hiatal hernia. Electronically signed by: Oneil Devonshire MD 09/13/2024 09:30 PM EST RP Workstation:  [id number]   DG Chest Port 1 View Result Date: 09/13/2024 EXAM: 1 VIEW(S) XRAY OF THE CHEST 09/13/2024 06:25:54 PM COMPARISON: None available. CLINICAL HISTORY: Shortness of breath. FINDINGS: LUNGS AND PLEURA: Right retrocardiac opacity consistent with known hiatal hernia. No pleural effusion. No pneumothorax. HEART AND MEDIASTINUM: Aortic calcification. No acute abnormality of the cardiac and mediastinal silhouettes. BONES AND SOFT TISSUES: No acute osseous abnormality. IMPRESSION: 1. No acute findings. 2. Right retrocardiac opacity consistent with hiatal hernia. Electronically signed by: Elsie Gravely MD 09/13/2024 07:04 PM EST RP Workstation: [id number]    Microbiology: Results for orders placed or performed during the hospital encounter of 09/13/24  Resp panel by RT-PCR (RSV, Flu A&B, Covid) Anterior Nasal Swab     Status: None   Collection Time: 09/13/24  5:51 PM   Specimen: Anterior Nasal Swab  Result Value Ref Range Status   SARS Coronavirus 2 by RT PCR NEGATIVE NEGATIVE Final    Comment: (NOTE) SARS-CoV-2 target nucleic acids are NOT DETECTED.  The SARS-CoV-2 RNA is generally detectable in upper respiratory specimens during the acute phase of infection. The lowest concentration of SARS-CoV-2 viral copies this assay can detect is 138 copies/mL. A negative result does not preclude SARS-Cov-2 infection and should not be used as the sole basis for treatment or other patient management decisions. A negative result may occur with  improper specimen collection/handling, submission of specimen other than nasopharyngeal swab, presence of viral mutation(s) within the areas targeted by this assay, and inadequate number of viral copies(<138 copies/mL). A negative result must be combined with clinical observations, patient history, and epidemiological information. The expected result is Negative.  Fact Sheet for Patients:  bloggercourse.com  Fact Sheet for  Healthcare Providers:  seriousbroker.it  This test is no t yet approved or cleared by the [place]  FDA and  has been authorized for detection and/or diagnosis of SARS-CoV-2 by FDA under an Emergency Use Authorization (EUA). This EUA will remain  in effect (meaning this test can be used) for the duration of the COVID-19 declaration under Section 564(b)(1) of the Act, 21 U.S.C.section 360bbb-3(b)(1), unless the authorization is terminated  or revoked sooner.       Influenza A by PCR NEGATIVE NEGATIVE Final   Influenza B  by PCR NEGATIVE NEGATIVE Final    Comment: (NOTE) The Xpert Xpress SARS-CoV-2/FLU/RSV plus assay is intended as an aid in the diagnosis of influenza from Nasopharyngeal swab specimens and should not be used as a sole basis for treatment. Nasal washings and aspirates are unacceptable for Xpert Xpress SARS-CoV-2/FLU/RSV testing.  Fact Sheet for Patients: bloggercourse.com  Fact Sheet for Healthcare Providers: seriousbroker.it  This test is not yet approved or cleared by the [place]  FDA and has been authorized for detection and/or diagnosis of SARS-CoV-2 by FDA under an Emergency Use Authorization (EUA). This EUA will remain in effect (meaning this test can be used) for the duration of the COVID-19 declaration under Section 564(b)(1) of the Act, 21 U.S.C. section 360bbb-3(b)(1), unless the authorization is terminated or revoked.     Resp Syncytial Virus by PCR NEGATIVE NEGATIVE Final    Comment: (NOTE) Fact Sheet for Patients: bloggercourse.com  Fact Sheet for Healthcare Providers: seriousbroker.it  This test is not yet approved or cleared by the [place]  FDA and has been authorized for detection and/or diagnosis of SARS-CoV-2 by FDA under an Emergency Use Authorization (EUA). This EUA will remain in effect (meaning this  test can be used) for the duration of the COVID-19 declaration under Section 564(b)(1) of the Act, 21 U.S.C. section 360bbb-3(b)(1), unless the authorization is terminated or revoked.  Performed at [facility], [address], [place], [place] [address]   Culture, blood (routine x 2)     Status: Abnormal   Collection Time: 09/13/24  6:13 PM   Specimen: BLOOD  Result Value Ref Range Status   Specimen Description   Final    BLOOD BLOOD LEFT ARM Performed at [facility], [address], [place], [place] [address]    Special Requests   Final    BOTTLES DRAWN AEROBIC AND ANAEROBIC Blood Culture adequate volume Performed at [facility], [address] [place], [place] [address]    Culture  Setup Time   Final    AEROBIC BOTTLE ONLY GRAM NEGATIVE RODS Gram Stain Report Called to,Read Back By and Verified WithBETHA JANELL GOSLING @ [phone] ON 09/14/24 JAYSON BUCK Performed at [facility], [address] [place], [place] [address]    Culture (A)  Final    ESCHERICHIA COLI SUSCEPTIBILITIES PERFORMED ON PREVIOUS CULTURE WITHIN THE LAST 5 DAYS. Performed at [facility], [address], [place], [place] [address]    Report Status 09/16/2024 FINAL  Final  Culture, blood (routine x 2)     Status: Abnormal   Collection Time: 09/13/24  6:13 PM   Specimen: BLOOD  Result Value Ref Range Status   Specimen Description   Final    BLOOD LEFT ANTECUBITAL Performed at [facility], [address], [place], [place] [address]    Special Requests   Final    BOTTLES DRAWN AEROBIC AND ANAEROBIC Blood Culture adequate volume Performed at [facility], [address] [place], [place] [address]    Culture  Setup Time   Final    IN BOTH AEROBIC AND ANAEROBIC BOTTLES GRAM NEGATIVE RODS Gram Stain Report Called to,Read Back By and Verified With: KATELYN ROGERS @ [phone] ON 09/14/24 C VARNER CRITICAL RESULT CALLED TO, READ BACK BY AND VERIFIED WITH: PHARMD Elspeth Sour on [phone] @1300  by SM Performed at [facility], [address] [place], [place] [address]    Culture ESCHERICHIA COLI (A)  Final   Report Status 09/16/2024 FINAL  Final   Organism ID,  Bacteria ESCHERICHIA COLI  Final      Susceptibility   Escherichia coli - MIC*    AMPICILLIN >=32 RESISTANT Resistant     CEFAZOLIN (NON-URINE) 8 RESISTANT Resistant     CEFEPIME <=0.12 SENSITIVE Sensitive     ERTAPENEM <=0.12 SENSITIVE Sensitive     CEFTRIAXONE  <=0.25 SENSITIVE Sensitive     CIPROFLOXACIN  >=4 RESISTANT Resistant     GENTAMICIN >=16 RESISTANT Resistant     MEROPENEM <=0.25 SENSITIVE Sensitive     TRIMETH /SULFA  <=20 SENSITIVE Sensitive     AMPICILLIN/SULBACTAM >=32 RESISTANT Resistant     PIP/TAZO Value in next row Intermediate      32 INTERMEDIATEThis is a modified FDA-approved test that has been validated and its performance characteristics determined by the reporting laboratory.  This laboratory is certified under the Clinical Laboratory Improvement Amendments CLIA as qualified to perform high complexity clinical laboratory testing.    * ESCHERICHIA COLI  Blood Culture ID Panel (Reflexed)     Status: Abnormal   Collection Time: 09/13/24  6:13 PM  Result Value Ref Range Status   Enterococcus faecalis NOT DETECTED NOT DETECTED Final   Enterococcus Faecium NOT DETECTED NOT DETECTED Final   Listeria monocytogenes NOT DETECTED NOT DETECTED Final   Staphylococcus species NOT DETECTED NOT DETECTED Final   Staphylococcus aureus (BCID) NOT DETECTED NOT DETECTED Final   Staphylococcus epidermidis NOT DETECTED NOT DETECTED Final   Staphylococcus lugdunensis NOT DETECTED NOT DETECTED Final   Streptococcus species NOT DETECTED NOT DETECTED Final   Streptococcus agalactiae NOT DETECTED NOT DETECTED Final   Streptococcus pneumoniae NOT DETECTED NOT DETECTED Final   Streptococcus pyogenes NOT DETECTED NOT DETECTED Final   A.calcoaceticus-baumannii NOT DETECTED NOT DETECTED Final   Bacteroides fragilis NOT DETECTED NOT DETECTED  Final   Enterobacterales DETECTED (A) NOT DETECTED Final    Comment: Enterobacterales represent a large order of gram negative bacteria, not a single organism. CRITICAL RESULT CALLED TO, READ BACK BY AND VERIFIED WITH: PHARMD Elspeth Sour on [id number] @1300  by SM    Enterobacter cloacae complex NOT DETECTED NOT DETECTED Final   Escherichia coli DETECTED (A) NOT DETECTED Final    Comment: CRITICAL RESULT CALLED TO, READ BACK BY AND VERIFIED WITH: PHARMD Elspeth Sour on [id number] @1300  by SM    Klebsiella aerogenes NOT DETECTED NOT DETECTED Final   Klebsiella oxytoca NOT DETECTED NOT DETECTED Final   Klebsiella pneumoniae NOT DETECTED NOT DETECTED Final   Proteus species NOT DETECTED NOT DETECTED Final   Salmonella species NOT DETECTED NOT DETECTED Final   Serratia marcescens NOT DETECTED NOT DETECTED Final   Haemophilus influenzae NOT DETECTED NOT DETECTED Final   Neisseria meningitidis NOT DETECTED NOT DETECTED Final   Pseudomonas aeruginosa NOT DETECTED NOT DETECTED Final   Stenotrophomonas maltophilia NOT DETECTED NOT DETECTED Final   Candida albicans NOT DETECTED NOT DETECTED Final   Candida auris NOT DETECTED NOT DETECTED Final   Candida glabrata NOT DETECTED NOT DETECTED Final   Candida krusei NOT DETECTED NOT DETECTED Final   Candida parapsilosis NOT DETECTED NOT DETECTED Final   Candida tropicalis NOT DETECTED NOT DETECTED Final   Cryptococcus neoformans/gattii NOT DETECTED NOT DETECTED Final   CTX-M ESBL NOT DETECTED NOT DETECTED Final   Carbapenem resistance IMP NOT DETECTED NOT DETECTED Final   Carbapenem resistance KPC NOT DETECTED NOT DETECTED Final   Carbapenem resistance NDM NOT DETECTED NOT DETECTED Final   Carbapenem resist OXA 48 LIKE NOT DETECTED NOT DETECTED Final   Carbapenem resistance VIM NOT DETECTED  NOT DETECTED Final    Comment: Performed at [facility], [address], [place], [place] [address]  MRSA Next Gen by PCR, Nasal     Status: None   Collection  Time: 09/14/24  4:07 AM   Specimen: Nasal Mucosa; Nasal Swab  Result Value Ref Range Status   MRSA by PCR Next Gen NOT DETECTED NOT DETECTED Final    Comment: (NOTE) The GeneXpert MRSA Assay (FDA approved for NASAL specimens only), is one component of a comprehensive MRSA colonization surveillance program. It is not intended to diagnose MRSA infection nor to guide or monitor treatment for MRSA infections. Test performance is not FDA approved in patients less than 26 years old. Performed at [facility], [address], [place], [place] [address]   MRSA Next Gen by PCR, Nasal     Status: None   Collection Time: 09/14/24  5:09 PM   Specimen: Nasal Mucosa; Nasal Swab  Result Value Ref Range Status   MRSA by PCR Next Gen NOT DETECTED NOT DETECTED Final    Comment: (NOTE) The GeneXpert MRSA Assay (FDA approved for NASAL specimens only), is one component of a comprehensive MRSA colonization surveillance program. It is not intended to diagnose MRSA infection nor to guide or monitor treatment for MRSA infections. Test performance is not FDA approved in patients less than 44 years old. Performed at [facility], [address] [place], [place] [address]   Aerobic/Anaerobic Culture w Gram Stain (surgical/deep wound)     Status: None   Collection Time: 09/14/24  5:33 PM   Specimen: BILE  Result Value Ref Range Status   Specimen Description BILE  Final   Special Requests NONE  Final   Gram Stain   Final    ABUNDANT WBC PRESENT, PREDOMINANTLY PMN MODERATE GRAM NEGATIVE RODS FEW GRAM POSITIVE COCCI    Culture   Final    ABUNDANT ESCHERICHIA COLI MODERATE KLEBSIELLA PNEUMONIAE MODERATE BACTEROIDES FRAGILIS BETA LACTAMASE POSITIVE Performed at [facility], [address] [place], [place] [address]    Report Status 09/17/2024 FINAL  Final   Organism ID, Bacteria ESCHERICHIA COLI  Final   Organism ID, Bacteria KLEBSIELLA PNEUMONIAE  Final      Susceptibility   Escherichia  coli - MIC*    AMPICILLIN >=32 RESISTANT Resistant     CEFAZOLIN (NON-URINE) 16 RESISTANT Resistant     CEFEPIME <=0.12 SENSITIVE Sensitive     ERTAPENEM <=0.12 SENSITIVE Sensitive     CEFTRIAXONE  <=0.25 SENSITIVE Sensitive     CIPROFLOXACIN  >=4 RESISTANT Resistant     GENTAMICIN >=16 RESISTANT Resistant     MEROPENEM <=0.25 SENSITIVE Sensitive     TRIMETH /SULFA  <=20 SENSITIVE Sensitive     AMPICILLIN/SULBACTAM >=32 RESISTANT Resistant     PIP/TAZO Value in next row Intermediate      64 INTERMEDIATEThis is a modified FDA-approved test that has been validated and its performance characteristics determined by the reporting laboratory.  This laboratory is certified under the Clinical Laboratory Improvement Amendments CLIA as qualified to perform high complexity clinical laboratory testing.    * ABUNDANT ESCHERICHIA COLI   Klebsiella pneumoniae - MIC*    AMPICILLIN Value in next row Resistant      64 INTERMEDIATEThis is a modified FDA-approved test that has been validated and its performance characteristics determined by the reporting laboratory.  This laboratory is certified under the Clinical Laboratory Improvement Amendments CLIA as qualified to perform high complexity clinical laboratory testing.  CEFAZOLIN (NON-URINE) Value in next row Sensitive      64 INTERMEDIATEThis is a modified FDA-approved test that has been validated and its performance characteristics determined by the reporting laboratory.  This laboratory is certified under the Clinical Laboratory Improvement Amendments CLIA as qualified to perform high complexity clinical laboratory testing.    CEFEPIME Value in next row Sensitive      64 INTERMEDIATEThis is a modified FDA-approved test that has been validated and its performance characteristics determined by the reporting laboratory.  This laboratory is certified under the Clinical Laboratory Improvement Amendments CLIA as qualified to perform high complexity clinical laboratory  testing.    ERTAPENEM Value in next row Sensitive      64 INTERMEDIATEThis is a modified FDA-approved test that has been validated and its performance characteristics determined by the reporting laboratory.  This laboratory is certified under the Clinical Laboratory Improvement Amendments CLIA as qualified to perform high complexity clinical laboratory testing.    CEFTRIAXONE  Value in next row Sensitive      64 INTERMEDIATEThis is a modified FDA-approved test that has been validated and its performance characteristics determined by the reporting laboratory.  This laboratory is certified under the Clinical Laboratory Improvement Amendments CLIA as qualified to perform high complexity clinical laboratory testing.    CIPROFLOXACIN  Value in next row Sensitive      64 INTERMEDIATEThis is a modified FDA-approved test that has been validated and its performance characteristics determined by the reporting laboratory.  This laboratory is certified under the Clinical Laboratory Improvement Amendments CLIA as qualified to perform high complexity clinical laboratory testing.    GENTAMICIN Value in next row Sensitive      64 INTERMEDIATEThis is a modified FDA-approved test that has been validated and its performance characteristics determined by the reporting laboratory.  This laboratory is certified under the Clinical Laboratory Improvement Amendments CLIA as qualified to perform high complexity clinical laboratory testing.    MEROPENEM Value in next row Sensitive      64 INTERMEDIATEThis is a modified FDA-approved test that has been validated and its performance characteristics determined by the reporting laboratory.  This laboratory is certified under the Clinical Laboratory Improvement Amendments CLIA as qualified to perform high complexity clinical laboratory testing.    TRIMETH /SULFA  Value in next row Sensitive      64 INTERMEDIATEThis is a modified FDA-approved test that has been validated and its performance  characteristics determined by the reporting laboratory.  This laboratory is certified under the Clinical Laboratory Improvement Amendments CLIA as qualified to perform high complexity clinical laboratory testing.    AMPICILLIN/SULBACTAM Value in next row Sensitive      64 INTERMEDIATEThis is a modified FDA-approved test that has been validated and its performance characteristics determined by the reporting laboratory.  This laboratory is certified under the Clinical Laboratory Improvement Amendments CLIA as qualified to perform high complexity clinical laboratory testing.    PIP/TAZO Value in next row Sensitive      <=4 SENSITIVEThis is a modified FDA-approved test that has been validated and its performance characteristics determined by the reporting laboratory.  This laboratory is certified under the Clinical Laboratory Improvement Amendments CLIA as qualified to perform high complexity clinical laboratory testing.    * MODERATE KLEBSIELLA PNEUMONIAE   Labs: CBC: Recent Labs  Lab 09/17/24 0856 09/18/24 0717 09/19/24 0417 09/20/24 0256 09/21/24 0412 09/22/24 0311 09/23/24 0342 09/23/24 1132  WBC 10.9* 9.4   < > 12.5* 12.0* 10.1 11.1* 10.1  NEUTROABS 9.6* 7.7  --   --   --  7.7 9.0*  --   HGB 8.5* 9.4*   < > 10.8* 10.6* 9.5* 7.7* 7.6*  HCT 24.9* 27.5*   < > 32.6* 32.7* 29.5* 23.0* 23.7*  MCV 86.5 85.9   < > 89.6 90.1 88.6 89.8 89.1  PLT 85* 75*   < > 93* 106* 121* 139* 148*   < > = values in this interval not displayed.   Basic Metabolic Panel: Recent Labs  Lab 09/17/24 0820 09/17/24 0856 09/18/24 0717 09/19/24 0417 09/20/24 0256 09/21/24 0412 09/22/24 0310 09/23/24 0342  NA 134*  --  135 135 134* 133* 135 135  K 4.4  --  4.7 5.0 5.0 5.1 4.4 4.2  CL 98  --  97* 97* 97* 97* 99 100  CO2 27  --  26 21* 23 21* 23 22  GLUCOSE 88  --  65* 61* 91 93 97 122*  BUN 48*  --  56* 63* 70* 78* 57* 62*  CREATININE 3.04*  --  3.69* 4.21* 4.83* 5.42* 4.47* 5.27*  CALCIUM  7.0*  --  7.1*  7.1* 7.4* 7.5* 7.4* 7.2*  MG  --  1.9  --   --   --   --  2.1 2.2  PHOS 2.9  --  3.8 5.2*  --   --   --   --    Liver Function Tests: Recent Labs  Lab 09/19/24 0417 09/20/24 0256 09/21/24 0412 09/22/24 0310 09/23/24 0342  AST 269* 168* 119* 87* 79*  ALT 267* 239* 207* 169* 140*  ALKPHOS 177* 133* 126 114 151*  BILITOT 0.7 0.5 0.5 0.4 0.4  PROT 4.3* 4.3* 4.5* 4.3* 4.1*  ALBUMIN  2.2*  2.2* 2.1* 2.2* 2.2* 2.0*   CBG: Recent Labs  Lab 09/22/24 1740 09/22/24 2054 09/23/24 0010 09/23/24 0632 09/23/24 1156  GLUCAP 117* 122* 128* 116* 122*    Discharge time spent: 50 minutes  Author: Yetta Blanch, MD  [facility]    "

## 2024-09-23 NOTE — Progress Notes (Signed)
 Occupational Therapy Treatment Patient Details Name: Benjamin [name] MRN: [id number] DOB: 07/27/1933 Today's Date: 09/23/2024   History of present illness Pt is a 89 y/o male presenting on 1/26 due to 2 week flu like symptoms, weakness, and decreased oral intake. CT with cholelithiasis and choledocholithiasis, suspicion for acute cholecystitis. CT angio negative PE but showed large iatal hernia. 09/14/24 transferred to [facility], s/p cholecystotomy tube placement. 1/29 s/p ERCP, per radiology failed but placed drainage cath.   2/5 S/P thoracentesis R side, removal of int/ext biliary drain and new drain placed.  PMH includes: thyroid disease, urinary retention, prior back surgery.   OT comments  Pt supine in bed, family at side.  Pt more alert today, following most simple commands with increased time but fades with fatigue. Pt oriented to self, hospital and month, requires cueing for year and reoriented to [facility]. Pt needing less assist for bed mobility this session, mod assist +2 to ascend to EOB and max assist +2 to return to bed. He requires max assist HOH for grooming supine, limited tolerance and attention to sustained activity.  HR up to 120s during EOB activity.  Family at side and supportive. Continue to follow acutely, recommend <3hrs/day inpatient setting at dc.       If plan is discharge home, recommend the following:  Two people to help with walking and/or transfers;Two people to help with bathing/dressing/bathroom;Direct supervision/assist for medications management;Direct supervision/assist for financial management;Assist for transportation;Help with stairs or ramp for entrance;Assistance with cooking/housework;Supervision due to cognitive status   Equipment Recommendations  Other (comment) (defer)    Recommendations for Other Services      Precautions / Restrictions Precautions Precautions: Fall;Other (comment) Recall of Precautions/Restrictions: Impaired Precaution/Restrictions Comments:  2 drains R abd, foley Restrictions Weight Bearing Restrictions Per Provider Order: No       Mobility Bed Mobility Overal bed mobility: Needs Assistance Bed Mobility: Supine to Sit, Sit to Supine     Supine to sit: Mod assist, +2 for physical assistance, HOB elevated, Used rails Sit to supine: Max assist, +2 for physical assistance   General bed mobility comments: Pt able to advance LLE better than RLE towards EOB and elevate trunk with mod A +2 overall. Increased assist to return to supine for BLE and trunk    Transfers                   General transfer comment: did not attempt due to quick fatigue and weakness     Balance                                           ADL either performed or assessed with clinical judgement   ADL Overall ADL's : Needs assistance/impaired     Grooming: Maximal assistance;Wash/dry face;Wash/dry hands;Sitting Grooming Details (indicate cue type and reason): hand over hand needed, max assist with limited initation or carryover         Upper Body Dressing : Maximal assistance;Sitting   Lower Body Dressing: Total assistance;+2 for physical assistance;Sitting/lateral leans;Bed level               Functional mobility during ADLs: +2 for physical assistance;Moderate assistance;Maximal assistance      Extremity/Trunk Assessment              Vision       Perception     Praxis  Communication Communication Communication: Impaired Factors Affecting Communication: Difficulty expressing self;Reduced clarity of speech   Cognition Arousal: Lethargic Behavior During Therapy: Flat affect Cognition: Cognition impaired   Orientation impairments: Situation, Place, Time Awareness: Intellectual awareness impaired Memory impairment (select all impairments): Short-term memory, 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  pt following simple commands, does have poor attention and requires direct cueing.                 Following commands: Intact, Impaired Following commands impaired: Follows one step commands with increased time, Follows one step commands inconsistently      Cueing   Cueing Techniques: Verbal cues, Tactile cues, Visual cues  Exercises      Shoulder Instructions       General Comments BP WFL throughout. HR up to 120 with mobility, but improved with rest    Pertinent Vitals/ Pain       Pain Assessment Pain Assessment: Faces Faces Pain Scale: Hurts a little bit Pain Location: generalized with mobility, pt doesn't specify location Pain Descriptors / Indicators: Discomfort Pain Intervention(s): Limited activity within patient's tolerance, Monitored during session, Repositioned  Home Living                                          Prior Functioning/Environment              Frequency  Min 2X/week        Progress Toward Goals  OT Goals(current goals can now be found in the care plan section)  Progress towards OT goals: Goals drowngraded-see care plan  Acute Rehab OT Goals Patient Stated Goal: unable OT Goal Formulation: Patient unable to participate in goal setting Time For Goal Achievement: 09/30/24 Potential to Achieve Goals: Fair ADL Goals Pt Will Perform Grooming: with mod assist;sitting Pt Will Perform Upper Body Bathing: with max assist;sitting Pt Will Perform Lower Body Dressing: with max assist;sitting/lateral leans Pt Will Transfer to Toilet: with max assist;with +2 assist;bedside commode Pt/caregiver will Perform Home Exercise Program: Increased  strength;Both right and left upper extremity Additional ADL Goal #1: Pt will maintain sitting balance at EOB with min assist for 5 minutes during ADLs.  Plan      Co-evaluation    PT/OT/SLP Co-Evaluation/Treatment: Yes Reason for Co-Treatment: Complexity of the patient's impairments (multi-system involvement);To address functional/ADL transfers;For patient/therapist safety PT goals addressed during session: Mobility/safety with mobility;Balance;Strengthening/ROM OT goals addressed during session: ADL's and self-care      AM-PAC OT 6 Clicks Daily Activity     Outcome Measure   Help from another person eating meals?: Total Help from another person taking care of personal grooming?: A Lot Help from another person toileting, which includes using toliet, bedpan, or urinal?: Total Help from another person bathing (including washing, rinsing, drying)?: A Lot Help from another person to put on and taking off regular upper body clothing?: A Lot Help from another person to put on and taking off regular lower body clothing?: Total 6 Click Score: 9    End of Session Equipment Utilized During Treatment: Oxygen  OT Visit Diagnosis: Other abnormalities of gait and mobility (R26.89);Muscle weakness (generalized) (M62.81);Other  symptoms and signs involving cognitive function   Activity Tolerance Patient tolerated treatment well   Patient Left in bed;with call bell/phone within reach;with nursing/sitter in room;with bed alarm set;with family/visitor present   Nurse Communication Mobility status        Time: 8963-8940 OT Time Calculation (min): 23 min  Charges: OT General Charges $OT Visit: 1 Visit OT Treatments $Self Care/Home Management : 8-22 mins  Etta NOVAK, OT Acute Rehabilitation Services Office [phone] Secure Chat Preferred    Etta GORMAN [place] 09/23/2024, 1:28 PM

## 2024-09-23 NOTE — Progress Notes (Signed)
 Speech Language Pathology Treatment: Dysphagia  Patient Details Name: Benjamin [name] MRN: [id number] DOB: Mar 10, 1933 Today's Date: 09/23/2024 Time: 9089-9074 SLP Time Calculation (min) (ACUTE ONLY): 15 min  Assessment / Plan / Recommendation Clinical Impression  Pt is marginally more alert but remains distractible and requires significant cueing to self-feed. There is blood and dried secretions lining his lips and tongue. There is gross anterior spillage with cup sips of thin liquids but no throat clearance or hydrophonia today. He initiates oral transit with purees but there is diffuse residue throughout his oral cavity.   PLAN: He is currently at increased risk due to impaired oral hygiene, fluctuating alertness, and confusion but also remains at considerable risk of inadequate nutrition/hydration. Would recommend he remain NPO until more consistently alert, at which point instrumental testing could be considered.     HPI HPI: Pt is a 89 y/o male presenting on 1/26 due to 2 week flu like symptoms, weakness, and decreased oral intake. CT with cholelithiasis and choledocholithiasis, suspicion for acute cholecystitis. CT angio negative PE but showed large iatal hernia. 09/14/24 transferred to [facility], s/p cholecystotomy tube placement. 1/29 s/p ERCP, per radiology failed but placed drainage cath. BSE 2/2 grossly functional as pt independently used strategies to compensate for edentulism. Change in mentation 2/4, prompting new consult. CXR 2/5 shows moderate bilateral pleural effusions and basilar airspace opacities as well as a large hiatal hernia. PMH includes: thyroid disease, urinary retention, prior back surgery.      SLP Plan  Continue with current plan of care        Swallow Evaluation Recommendations   Recommendations: NPO except meds;Ice chips PRN after oral care Medication Administration: Crushed with puree Oral care recommendations: Oral care QID (4x/day);Oral care before ice  chips/water      Recommendations                     Oral care QID;Oral care prior to ice chip/H20   Frequent or constant Supervision/Assistance Dysphagia, unspecified (R13.10)     Continue with current plan of care     Damien Blumenthal, M.A., CCC-SLP Speech Language Pathology, Acute Rehabilitation Services  Secure Chat preferred [phone]   09/23/2024, 10:31 AM

## 2024-10-21 ENCOUNTER — Ambulatory Visit: Admitting: Gastroenterology

## 2024-11-09 ENCOUNTER — Ambulatory Visit (HOSPITAL_COMMUNITY): Admit: 2024-11-09 | Admitting: Gastroenterology

## 2024-11-09 ENCOUNTER — Encounter (HOSPITAL_COMMUNITY): Payer: Self-pay
